# Patient Record
Sex: Female | Born: 1950 | Race: Black or African American | Hispanic: No | Smoking: Never smoker
Health system: Southern US, Community
[De-identification: ages and names within clinical notes are randomized; demographics above are authoritative.]

## PROBLEM LIST (undated history)

## (undated) DIAGNOSIS — E119 Type 2 diabetes mellitus without complications: Secondary | ICD-10-CM

## (undated) DIAGNOSIS — E785 Hyperlipidemia, unspecified: Secondary | ICD-10-CM

## (undated) DIAGNOSIS — I10 Essential (primary) hypertension: Secondary | ICD-10-CM

## (undated) DIAGNOSIS — B029 Zoster without complications: Secondary | ICD-10-CM

## (undated) DIAGNOSIS — K589 Irritable bowel syndrome without diarrhea: Secondary | ICD-10-CM

## (undated) DIAGNOSIS — M109 Gout, unspecified: Secondary | ICD-10-CM

## (undated) HISTORY — DX: Hyperlipidemia, unspecified: E78.5

## (undated) HISTORY — PX: CHOLECYSTECTOMY: SHX55

## (undated) HISTORY — PX: APPENDECTOMY: SHX54

## (undated) HISTORY — PX: CARPAL TUNNEL RELEASE: SHX101

## (undated) HISTORY — DX: Zoster without complications: B02.9

## (undated) HISTORY — DX: Gout, unspecified: M10.9

## (undated) HISTORY — DX: Essential (primary) hypertension: I10

## (undated) HISTORY — PX: ABDOMINAL HYSTERECTOMY: SHX81

## (undated) HISTORY — DX: Type 2 diabetes mellitus without complications: E11.9

## (undated) HISTORY — DX: Irritable bowel syndrome, unspecified: K58.9

## (undated) HISTORY — DX: Morbid (severe) obesity due to excess calories: E66.01

---

## 1988-03-21 ENCOUNTER — Encounter (INDEPENDENT_AMBULATORY_CARE_PROVIDER_SITE_OTHER): Payer: Self-pay | Admitting: *Deleted

## 1997-06-19 ENCOUNTER — Encounter: Payer: Self-pay | Admitting: Internal Medicine

## 1998-01-02 ENCOUNTER — Ambulatory Visit (HOSPITAL_COMMUNITY): Admission: RE | Admit: 1998-01-02 | Discharge: 1998-01-02 | Payer: Self-pay | Admitting: Obstetrics & Gynecology

## 1998-08-26 ENCOUNTER — Ambulatory Visit (HOSPITAL_COMMUNITY): Admission: RE | Admit: 1998-08-26 | Discharge: 1998-08-26 | Payer: Self-pay | Admitting: Family Medicine

## 1998-08-26 ENCOUNTER — Encounter: Payer: Self-pay | Admitting: Family Medicine

## 1998-12-23 ENCOUNTER — Ambulatory Visit (HOSPITAL_COMMUNITY): Admission: RE | Admit: 1998-12-23 | Discharge: 1998-12-23 | Payer: Self-pay | Admitting: Family Medicine

## 1998-12-23 ENCOUNTER — Encounter: Payer: Self-pay | Admitting: Family Medicine

## 1999-01-14 ENCOUNTER — Ambulatory Visit (HOSPITAL_COMMUNITY): Admission: RE | Admit: 1999-01-14 | Discharge: 1999-01-14 | Payer: Self-pay | Admitting: Obstetrics & Gynecology

## 1999-01-14 ENCOUNTER — Encounter: Payer: Self-pay | Admitting: Obstetrics & Gynecology

## 1999-11-04 ENCOUNTER — Ambulatory Visit (HOSPITAL_COMMUNITY): Admission: RE | Admit: 1999-11-04 | Discharge: 1999-11-04 | Payer: Self-pay | Admitting: Family Medicine

## 1999-11-04 ENCOUNTER — Encounter: Payer: Self-pay | Admitting: Family Medicine

## 1999-11-11 ENCOUNTER — Other Ambulatory Visit: Admission: RE | Admit: 1999-11-11 | Discharge: 1999-11-11 | Payer: Self-pay | Admitting: Obstetrics and Gynecology

## 1999-12-02 ENCOUNTER — Encounter: Payer: Self-pay | Admitting: Internal Medicine

## 2000-02-11 ENCOUNTER — Encounter: Payer: Self-pay | Admitting: Obstetrics and Gynecology

## 2000-02-11 ENCOUNTER — Ambulatory Visit (HOSPITAL_COMMUNITY): Admission: RE | Admit: 2000-02-11 | Discharge: 2000-02-11 | Payer: Self-pay | Admitting: Obstetrics and Gynecology

## 2001-03-26 ENCOUNTER — Other Ambulatory Visit: Admission: RE | Admit: 2001-03-26 | Discharge: 2001-03-26 | Payer: Self-pay | Admitting: Obstetrics and Gynecology

## 2001-10-11 ENCOUNTER — Ambulatory Visit (HOSPITAL_COMMUNITY): Admission: RE | Admit: 2001-10-11 | Discharge: 2001-10-11 | Payer: Self-pay | Admitting: Obstetrics and Gynecology

## 2001-10-11 ENCOUNTER — Encounter: Payer: Self-pay | Admitting: Obstetrics and Gynecology

## 2001-12-07 ENCOUNTER — Ambulatory Visit (HOSPITAL_COMMUNITY): Admission: RE | Admit: 2001-12-07 | Discharge: 2001-12-07 | Payer: Self-pay | Admitting: Family Medicine

## 2001-12-07 ENCOUNTER — Encounter: Payer: Self-pay | Admitting: Family Medicine

## 2002-10-03 ENCOUNTER — Ambulatory Visit (HOSPITAL_COMMUNITY): Admission: RE | Admit: 2002-10-03 | Discharge: 2002-10-03 | Payer: Self-pay | Admitting: Obstetrics and Gynecology

## 2002-10-03 ENCOUNTER — Encounter: Payer: Self-pay | Admitting: Obstetrics and Gynecology

## 2002-12-11 ENCOUNTER — Encounter: Payer: Self-pay | Admitting: Family Medicine

## 2002-12-11 ENCOUNTER — Ambulatory Visit (HOSPITAL_COMMUNITY): Admission: RE | Admit: 2002-12-11 | Discharge: 2002-12-11 | Payer: Self-pay | Admitting: Family Medicine

## 2003-11-20 ENCOUNTER — Ambulatory Visit (HOSPITAL_COMMUNITY): Admission: RE | Admit: 2003-11-20 | Discharge: 2003-11-20 | Payer: Self-pay | Admitting: Obstetrics and Gynecology

## 2004-12-16 ENCOUNTER — Ambulatory Visit (HOSPITAL_COMMUNITY): Admission: RE | Admit: 2004-12-16 | Discharge: 2004-12-16 | Payer: Self-pay | Admitting: Obstetrics and Gynecology

## 2005-02-06 ENCOUNTER — Ambulatory Visit (HOSPITAL_COMMUNITY): Admission: RE | Admit: 2005-02-06 | Discharge: 2005-02-06 | Payer: Self-pay | Admitting: Family Medicine

## 2005-12-17 ENCOUNTER — Ambulatory Visit (HOSPITAL_COMMUNITY): Admission: RE | Admit: 2005-12-17 | Discharge: 2005-12-17 | Payer: Self-pay | Admitting: Family Medicine

## 2007-01-10 ENCOUNTER — Ambulatory Visit (HOSPITAL_COMMUNITY): Admission: RE | Admit: 2007-01-10 | Discharge: 2007-01-10 | Payer: Self-pay | Admitting: Obstetrics and Gynecology

## 2008-01-19 ENCOUNTER — Ambulatory Visit (HOSPITAL_COMMUNITY): Admission: RE | Admit: 2008-01-19 | Discharge: 2008-01-19 | Payer: Self-pay | Admitting: Obstetrics and Gynecology

## 2008-02-08 ENCOUNTER — Emergency Department (HOSPITAL_COMMUNITY): Admission: EM | Admit: 2008-02-08 | Discharge: 2008-02-08 | Payer: Self-pay | Admitting: Emergency Medicine

## 2008-10-09 ENCOUNTER — Ambulatory Visit (HOSPITAL_COMMUNITY): Admission: RE | Admit: 2008-10-09 | Discharge: 2008-10-09 | Payer: Self-pay | Admitting: Obstetrics and Gynecology

## 2009-04-10 ENCOUNTER — Ambulatory Visit (HOSPITAL_COMMUNITY): Admission: RE | Admit: 2009-04-10 | Discharge: 2009-04-10 | Payer: Self-pay | Admitting: Obstetrics and Gynecology

## 2010-01-09 ENCOUNTER — Ambulatory Visit (HOSPITAL_COMMUNITY): Admission: RE | Admit: 2010-01-09 | Discharge: 2010-01-09 | Payer: Self-pay | Admitting: Family Medicine

## 2010-01-10 ENCOUNTER — Ambulatory Visit (HOSPITAL_COMMUNITY): Admission: RE | Admit: 2010-01-10 | Discharge: 2010-01-10 | Payer: Self-pay | Admitting: Family Medicine

## 2010-01-29 ENCOUNTER — Encounter: Payer: Self-pay | Admitting: Pulmonary Disease

## 2010-02-19 DIAGNOSIS — E7800 Pure hypercholesterolemia, unspecified: Secondary | ICD-10-CM | POA: Insufficient documentation

## 2010-02-19 DIAGNOSIS — K219 Gastro-esophageal reflux disease without esophagitis: Secondary | ICD-10-CM | POA: Insufficient documentation

## 2010-02-19 DIAGNOSIS — I10 Essential (primary) hypertension: Secondary | ICD-10-CM | POA: Insufficient documentation

## 2010-02-19 DIAGNOSIS — D509 Iron deficiency anemia, unspecified: Secondary | ICD-10-CM | POA: Insufficient documentation

## 2010-02-19 DIAGNOSIS — F411 Generalized anxiety disorder: Secondary | ICD-10-CM | POA: Insufficient documentation

## 2010-02-19 DIAGNOSIS — H409 Unspecified glaucoma: Secondary | ICD-10-CM | POA: Insufficient documentation

## 2010-02-19 DIAGNOSIS — J309 Allergic rhinitis, unspecified: Secondary | ICD-10-CM | POA: Insufficient documentation

## 2010-02-19 DIAGNOSIS — D518 Other vitamin B12 deficiency anemias: Secondary | ICD-10-CM | POA: Insufficient documentation

## 2010-02-19 DIAGNOSIS — D510 Vitamin B12 deficiency anemia due to intrinsic factor deficiency: Secondary | ICD-10-CM | POA: Insufficient documentation

## 2010-02-20 ENCOUNTER — Ambulatory Visit: Payer: Self-pay | Admitting: Pulmonary Disease

## 2010-02-20 DIAGNOSIS — R51 Headache: Secondary | ICD-10-CM | POA: Insufficient documentation

## 2010-02-20 DIAGNOSIS — R519 Headache, unspecified: Secondary | ICD-10-CM | POA: Insufficient documentation

## 2010-02-20 DIAGNOSIS — D869 Sarcoidosis, unspecified: Secondary | ICD-10-CM | POA: Insufficient documentation

## 2010-03-12 ENCOUNTER — Ambulatory Visit: Payer: Self-pay | Admitting: Pulmonary Disease

## 2010-05-08 ENCOUNTER — Ambulatory Visit (HOSPITAL_COMMUNITY): Admission: RE | Admit: 2010-05-08 | Discharge: 2010-05-08 | Payer: Self-pay | Admitting: Obstetrics and Gynecology

## 2010-11-11 NOTE — Assessment & Plan Note (Signed)
Summary: consult for sarcoidosis   Copy to:  Merri Brunette Primary Provider/Referring Provider:  Merri Brunette  CC:  Pulmonary Consult.  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  She was diagnosed in 1989 with mediastinoscopy, and presented at that time with LN but no parenchymal disease.  She also has had a h/o ocular involvement.  Over the years, she has been completely stable, with persistent LN but no ISLD.  She has not required treatment from a pulmonary standpoint since her original diagnosis, and has not had a recent cxr or pfts.  She does note some increase in doe, but believes it is related to her obesity and conditioning.  She denies any cough or chest congestion.    Preventive Screening-Counseling & Management  Alcohol-Tobacco     Smoking Status: never  Current Medications (verified): 1)  Benicar 40 Mg Tabs (Olmesartan Medoxomil) .... Take 1 Tablet By Mouth Once A Day 2)  Norvasc 5 Mg Tabs (Amlodipine Besylate) .... Take 1 Tablet By Mouth Once A Day 3)  Lasix 20 Mg Tabs (Furosemide) .... Take 1 Tablet By Mouth Once A Day 4)  Alprazolam 0.5 Mg Tabs (Alprazolam) .... As Needed 5)  Omeprazole 20 Mg Cpdr (Omeprazole) .... Take 1 Tablet By Mouth Once A Day 6)  Allegra 180 Mg Tabs (Fexofenadine Hcl) .... Take 1 Tablet By Mouth Once A Day 7)  Singulair 10 Mg Tabs (Montelukast Sodium) .... Take 1 Tablet By Mouth Once A Day As Needed 8)  Nasonex 50 Mcg/act  Susp (Mometasone Furoate) .... Two Puffs Each Nostril Daily As Needed 9)  Ventolin Hfa 108 (90 Base) Mcg/act  Aers (Albuterol Sulfate) .Marland Kitchen.. 1-2 Puffs Every 4-6 Hours As Needed 10)  Timolol Maleate 0.5 % Soln (Timolol Maleate) .Marland Kitchen.. 1 Drop in Both Eyes Two Times A Day 11)  Travatan Z 0.004 % Soln (Travoprost) .Marland Kitchen.. 1 Drop Into Both Eyes Each Evening 12)  Fish Oil 1000 Mg Caps (Omega-3 Fatty Acids) .... Take 3 Tabs By Mouth Daily 13)  Multivitamins  Tabs  (Multiple Vitamin) .... Take 1 Tablet By Mouth Once A Day 14)  Alphagan P 0.1 % Soln (Brimonidine Tartrate) .Marland Kitchen.. 1 Drop in Both Eyes Two Times A Day 15)  Pataday 0.2 % Soln (Olopatadine Hcl) .... Use As Needed 16)  Refresh Tears 0.5 % Soln (Carboxymethylcellulose Sodium) .... As Needed 17)  Aspirin Ec Low Dose 81 Mg Tbec (Aspirin) .... Take 1 Tablet By Mouth Once A Day 18)  Zetia 10 Mg Tabs (Ezetimibe) .... Take 1 Tablet By Mouth Once A Day 19)  Cleanse More .... Take 2 At Bedtime  Allergies (verified): 1)  ! Prednisone  Past History:  Past Medical History: sarcoidosis--dx 1989 by mediastinoscopy, +ocular involvement. HEADACHE, CHRONIC (ICD-784.0) GASTROESOPHAGEAL REFLUX DISEASE (ICD-530.81) ALLERGIC RHINITIS (ICD-477.9) ANXIETY (ICD-300.00) ANEMIA, IRON DEFICIENCY (ICD-280.9) ANEMIA, B12 DEFICIENCY (ICD-281.1) ANEMIA, PERNICIOUS (ICD-281.0) HYPERCHOLESTEROLEMIA (ICD-272.0) GLAUCOMA (ICD-365.9) OBESITY (ICD-278.00) HYPERTENSION (ICD-401.9)    Past Surgical History: appendectomy at age 67 cholecystectomy 1977 cataract surgery 2000s hysterectomy, ovaries remain 1980s stomach staple 1980s bilateral carpal tunnel repair  1980s glaucoma surgery in both eyes 1980s cornea surgery Feb 2011  Family History: Reviewed history from 02/19/2010 and no changes required. allergies: son, sister heart disease: mother, sister, brother cancer: father Designer, fashion/clothing)   Social History: Reviewed history and no changes required. Patient never smoked.  pt is divorced. pt has children. pt lives alone. pt works as an Pharmacologist  Status:  never  Review of Systems       The patient complains of shortness of breath with activity, chest pain, irregular heartbeats, abdominal pain, anxiety, and hand/feet swelling.  The patient denies shortness of breath at rest, productive cough, non-productive cough, coughing up blood, acid heartburn, indigestion, loss of appetite, weight  change, difficulty swallowing, sore throat, tooth/dental problems, headaches, nasal congestion/difficulty breathing through nose, sneezing, itching, ear ache, depression, joint stiffness or pain, rash, change in color of mucus, and fever.    Vital Signs:  Patient profile:   60 year old female Height:      64 inches Weight:      264 pounds BMI:     45.48 O2 Sat:      98 % on Room air Temp:     97.9 degrees F oral Pulse rate:   77 / minute BP sitting:   158 / 96  (right arm) Cuff size:   large  Vitals Entered By: Arman Filter LPN (Feb 20, 2010 3:19 PM)  O2 Flow:  Room air CC: Pulmonary Consult Comments Medications reviewed with patient Arman Filter LPN  Feb 20, 2010 3:19 PM    Physical Exam  General:  obese female in nad Eyes:  PERRLA and EOMI.   Nose:  patent without discharge Mouth:  clear Neck:  no jvd, tmg, LN Lungs:  totally clear to auscultation Heart:  rrr, no mrg Abdomen:  soft and nontender, bs+ Extremities:  1+ edema bilat, no cyanosis pulses intact distally Neurologic:  alert and oriented, moves all 4.   Impression & Recommendations:  Problem # 1:  PULMONARY SARCOIDOSIS (ICD-135) the pt has a history of sarcoidosis, manifested as mediastinal/hilar LN and ocular involvement.  She has never had interstitial disease, nor has she had pulmonary symptoms since her original diagnosis.  Currently, she is fairly asymptomatic, and I agree that her weight and conditioning are more likely causes of her mild doe than her sarcoid.  She has not had recent pfts or cxr, and I think these are worth doing to establish a more current baseline for her in the event of breathing issues down the road.  I will call the pt once these are done and discuss results.  Medications Added to Medication List This Visit: 1)  Alprazolam 0.5 Mg Tabs (Alprazolam) .... As needed 2)  Singulair 10 Mg Tabs (Montelukast sodium) .... Take 1 tablet by mouth once a day as needed 3)  Nasonex 50 Mcg/act  Susp (Mometasone furoate) .... Two puffs each nostril daily as needed 4)  Timolol Maleate 0.5 % Soln (Timolol maleate) .Marland Kitchen.. 1 drop in both eyes two times a day 5)  Travatan Z 0.004 % Soln (Travoprost) .Marland Kitchen.. 1 drop into both eyes each evening 6)  Fish Oil 1000 Mg Caps (Omega-3 fatty acids) .... Take 3 tabs by mouth daily 7)  Alphagan P 0.1 % Soln (Brimonidine tartrate) .Marland Kitchen.. 1 drop in both eyes two times a day 8)  Pataday 0.2 % Soln (Olopatadine hcl) .... Use as needed 9)  Refresh Tears 0.5 % Soln (Carboxymethylcellulose sodium) .... As needed 10)  Aspirin Ec Low Dose 81 Mg Tbec (Aspirin) .... Take 1 tablet by mouth once a day 11)  Zetia 10 Mg Tabs (Ezetimibe) .... Take 1 tablet by mouth once a day 12)  Cleanse More  .... Take 2 at bedtime  Other Orders: Consultation Level III (16109) Pulmonary Referral (Pulmonary) T-2 View CXR (71020TC)  Patient Instructions: 1)  will check cxr today  2)  will setup for breathing studies, and let you know the results. 3)  work on weight loss and some type of conditioning program.    Appended Document: consult for sarcoidosis please let pt know that her breathing tests show that her lungs are mildly restricted, and probably secondary to her weight.  she needs to make a followup visit with me in one year or sooner if having new symptoms.    Appended Document: consult for sarcoidosis [id number]  Appended Document: consult for sarcoidosis called and spoke with pt.  pt aware of PFT results and to f/u in 1 yr or sooner if she develops problems.  pt verbalized understanding and denied any questions.

## 2010-11-11 NOTE — Letter (Signed)
Summary: [facility]  [facility]   Imported By: Sherian Rein 02/21/2010 09:35:25  _____________________________________________________________________  External Attachment:    Type:   Image     Comment:   External Document

## 2010-11-11 NOTE — Miscellaneous (Signed)
Summary: Orders Update pft charges  Clinical Lists Changes  Orders: Added new Service order of Carbon Monoxide diffusing w/capacity (94720) - Signed Added new Service order of Lung Volumes (94240) - Signed Added new Service order of Spirometry (Pre & Post) (94060) - Signed 

## 2010-11-11 NOTE — Letter (Signed)
Summary: [facility]   Imported By: Sherian Rein 02/27/2010 10:52:35  _____________________________________________________________________  External Attachment:    Type:   Image     Comment:   External Document

## 2010-11-11 NOTE — Consult Note (Signed)
[facility]   Imported By: Sherian Rein 02/21/2010 09:40:30  _____________________________________________________________________  External Attachment:    Type:   Image     Comment:   External Document

## 2010-11-11 NOTE — Consult Note (Signed)
[facility]   Imported By: Sherian Rein 02/21/2010 09:38:10  _____________________________________________________________________  External Attachment:    Type:   Image     Comment:   External Document

## 2011-05-01 ENCOUNTER — Other Ambulatory Visit (HOSPITAL_COMMUNITY): Payer: Self-pay | Admitting: Obstetrics and Gynecology

## 2011-05-01 DIAGNOSIS — Z1231 Encounter for screening mammogram for malignant neoplasm of breast: Secondary | ICD-10-CM

## 2011-05-14 ENCOUNTER — Ambulatory Visit (HOSPITAL_COMMUNITY): Payer: 59

## 2011-05-14 ENCOUNTER — Ambulatory Visit (HOSPITAL_COMMUNITY)
Admission: RE | Admit: 2011-05-14 | Discharge: 2011-05-14 | Disposition: A | Discharge status: Home / Self Care | Payer: 59 | Source: Ambulatory Visit | Attending: Family Medicine | Admitting: Family Medicine

## 2011-05-14 DIAGNOSIS — M7989 Other specified soft tissue disorders: Secondary | ICD-10-CM | POA: Insufficient documentation

## 2011-05-14 DIAGNOSIS — M79609 Pain in unspecified limb: Secondary | ICD-10-CM | POA: Insufficient documentation

## 2011-07-07 LAB — DIFFERENTIAL
Basophils Absolute: 0
Basophils Relative: 1
Eosinophils Absolute: 0.4
Eosinophils Relative: 5
Lymphocytes Relative: 23
Lymphs Abs: 1.8
Monocytes Absolute: 0.7
Monocytes Relative: 10
Neutro Abs: 4.8
Neutrophils Relative %: 61

## 2011-07-07 LAB — CBC
HCT: 36.5
Hemoglobin: 12.4
MCHC: 34
MCV: 89
Platelets: 386
RBC: 4.1
RDW: 14.6
WBC: 7.9

## 2011-07-07 LAB — COMPREHENSIVE METABOLIC PANEL
ALT: 20
AST: 21
Albumin: 3.7
Alkaline Phosphatase: 62
BUN: 11
CO2: 31
Calcium: 9.2
Chloride: 101
Creatinine, Ser: 1.23 — ABNORMAL HIGH
GFR calc Af Amer: 54 — ABNORMAL LOW
GFR calc non Af Amer: 45 — ABNORMAL LOW
Glucose, Bld: 115 — ABNORMAL HIGH
Potassium: 3.5
Sodium: 140
Total Bilirubin: 0.6
Total Protein: 7.4

## 2011-07-07 LAB — URINALYSIS, ROUTINE W REFLEX MICROSCOPIC
Specific Gravity, Urine: 1.011
pH: 6

## 2011-07-16 ENCOUNTER — Ambulatory Visit (HOSPITAL_COMMUNITY)
Admission: RE | Admit: 2011-07-16 | Discharge: 2011-07-16 | Disposition: A | Discharge status: Home / Self Care | Payer: 59 | Source: Ambulatory Visit | Attending: Obstetrics and Gynecology | Admitting: Obstetrics and Gynecology

## 2011-07-16 DIAGNOSIS — Z1231 Encounter for screening mammogram for malignant neoplasm of breast: Secondary | ICD-10-CM | POA: Insufficient documentation

## 2012-01-11 ENCOUNTER — Encounter: Payer: Self-pay | Admitting: Gastroenterology

## 2012-07-28 ENCOUNTER — Other Ambulatory Visit (HOSPITAL_COMMUNITY): Payer: Self-pay | Admitting: Obstetrics and Gynecology

## 2012-07-28 DIAGNOSIS — Z1231 Encounter for screening mammogram for malignant neoplasm of breast: Secondary | ICD-10-CM

## 2012-08-16 ENCOUNTER — Ambulatory Visit (HOSPITAL_COMMUNITY)
Admission: RE | Admit: 2012-08-16 | Discharge: 2012-08-16 | Disposition: A | Discharge status: Home / Self Care | Payer: 59 | Source: Ambulatory Visit | Attending: Obstetrics and Gynecology | Admitting: Obstetrics and Gynecology

## 2012-08-16 DIAGNOSIS — Z1231 Encounter for screening mammogram for malignant neoplasm of breast: Secondary | ICD-10-CM | POA: Insufficient documentation

## 2013-06-22 ENCOUNTER — Other Ambulatory Visit (HOSPITAL_COMMUNITY): Payer: Self-pay | Admitting: Obstetrics and Gynecology

## 2013-06-22 DIAGNOSIS — Z1231 Encounter for screening mammogram for malignant neoplasm of breast: Secondary | ICD-10-CM

## 2013-08-24 ENCOUNTER — Ambulatory Visit (HOSPITAL_COMMUNITY)
Admission: RE | Admit: 2013-08-24 | Discharge: 2013-08-24 | Disposition: A | Discharge status: Home / Self Care | Payer: 59 | Source: Ambulatory Visit | Attending: Obstetrics and Gynecology | Admitting: Obstetrics and Gynecology

## 2013-08-24 DIAGNOSIS — Z1231 Encounter for screening mammogram for malignant neoplasm of breast: Secondary | ICD-10-CM | POA: Insufficient documentation

## 2013-08-27 ENCOUNTER — Other Ambulatory Visit: Payer: Self-pay | Admitting: Cardiology

## 2014-02-14 ENCOUNTER — Encounter: Payer: Self-pay | Admitting: Cardiology

## 2014-04-06 ENCOUNTER — Ambulatory Visit: Payer: 59 | Admitting: Cardiology

## 2014-05-24 ENCOUNTER — Encounter: Payer: Self-pay | Admitting: Cardiology

## 2014-05-24 ENCOUNTER — Ambulatory Visit (INDEPENDENT_AMBULATORY_CARE_PROVIDER_SITE_OTHER): Payer: 59 | Admitting: Cardiology

## 2014-05-24 VITALS — BP 132/98 | HR 65 | Ht 64.0 in | Wt 275.4 lb

## 2014-05-24 DIAGNOSIS — I10 Essential (primary) hypertension: Secondary | ICD-10-CM

## 2014-05-24 NOTE — Progress Notes (Signed)
[address] [place], [place]  [address] Phone: [phone] Fax:  [phone]  Date:  05/24/2014   ID:  Wendy Lagosatricia A Demasi, DOB May 13, 1951, MRN [id number]  PCP:  PROVIDER NOT IN SYSTEM   History of Present Illness: Wendy Downs is a 63 y.o. female with hx of difficult to control hypertension, obesity, and hyperlipidemia. Nonsmoker. BMI 41. HTN since [age]year old. Normal renal duplex. She continues to deny any symptoms such as chest pain, shortness of breath, syncope, nor palpitations. She has had edema on higher dose amlodipine and gout on HCTZ. After BP 196/104, Dr Anne FuSkains started Spironolactone 25 mg po qd. She states she is tolerating the medication without difficulty.Labs after med started were stable. She has been trying to eat healthier with more fresh fruits and vegetables.     Wt Readings from Last 3 Encounters:  05/24/14 275 lb 6.4 oz (124.921 kg)  02/20/10 264 lb (119.75 kg)     Past Medical History  Diagnosis Date  . Hypertension   . Hyperlipidemia   . Gout   . Shingles   . Morbid obesity     History reviewed. No pertinent past surgical history.  Current Outpatient Prescriptions  Medication Sig Dispense Refill  . amLODipine (NORVASC) 5 MG tablet Take 5 mg by mouth daily.      . B Complex Vitamins (VITAMIN B COMPLEX IJ) Inject as directed. Vitamin b12 injection      . brimonidine (ALPHAGAN P) 0.1 % SOLN       . furosemide (LASIX) 20 MG tablet Take 20 mg by mouth.      Marland Kitchen. LORazepam (ATIVAN) 0.5 MG tablet Take 0.5 mg by mouth every 8 (eight) hours.      . Multiple Vitamins-Minerals (MEGA MULTI WOMEN PO) Take by mouth.      . Nebivolol HCl (BYSTOLIC) 20 MG TABS Take by mouth.      . olmesartan (BENICAR) 40 MG tablet Take 40 mg by mouth daily.      . Omega-3 Fatty Acids (FISH OIL) 1000 MG CAPS Take by mouth.      . timolol (BETIMOL) 0.25 % ophthalmic solution 1-2 drops 2 (two) times daily.      . travoprost, benzalkonium, (TRAVATAN) 0.004 %  ophthalmic solution 1 drop at bedtime.       No current facility-administered medications for this visit.    Allergies:    Allergies  Allergen Reactions  . Prednisone   . Valtrex [Valacyclovir Hcl]   . Vaseretic [Enalapril-Hydrochlorothiazide]     Social History:  The patient  reports that she has never smoked. She does not have any smokeless tobacco history on file.   Family History  Problem Relation Age of Onset  . Heart attack Mother   . Hypertension Mother   . Stroke Father   . Heart disease Sister   . Stroke Sister   . Heart disease Brother   . Stroke Brother   . Stroke Sister   . Aneurysm Brother     ROS:  Please see the history of present illness.   Denies any syncope, bleeding, orthopnea, PND   All other systems reviewed and negative.   PHYSICAL EXAM: VS:  BP 132/98  Pulse 65  Ht 5\' 4"  (1.626 m)  Wt 275 lb 6.4 oz (124.921 kg)  BMI 47.25 kg/m2 Well nourished, well developed, in no acute distress HEENT: normal, [place]/AT, EOMI Neck: no JVD, normal carotid upstroke, no bruit Cardiac:  normal  S1, S2; RRR; no murmur Lungs:  clear to auscultation bilaterally, no wheezing, rhonchi or rales Abd: soft, nontender, no hepatomegaly, no bruitsobese Ext: no edema, 2+ distal pulses Skin: warm and dry GU: deferred Neuro: no focal abnormalities noted, AAO x 3  EKG:  05/24/14-sinus rhythm, 65, no other abnormalities, poor R-wave progression.     ASSESSMENT AND PLAN:  1. Hypertension-diastolic blood pressure is elevated today however systolic is much improved. In the past, we have tried spironolactone however she ended up with prerenal azotemia, BUN 62, creatinine 2. Her creatinine currently is in the 1 range. Avoid heavy diuresis. Her blood pressure will be helped tremendously also with weight loss. 2. Morbid obesity-she is back on Weight Watchers. Encouragement. She is very motivated. She is about to retire from [employer]. We discussed weight loss at length today. 3. We'll  see back in one year.  Signed, Donato Schultz, MD [facility]  05/24/2014 4:07 PM

## 2014-05-24 NOTE — Patient Instructions (Signed)
The current medical regimen is effective;  continue present plan and medications.  Follow up in 1 year with Dr Skains.  You will receive a letter in the mail 2 months before you are due.  Please call us when you receive this letter to schedule your follow up appointment.  

## 2014-06-13 ENCOUNTER — Other Ambulatory Visit: Payer: Self-pay | Admitting: Gastroenterology

## 2014-06-23 ENCOUNTER — Emergency Department (HOSPITAL_BASED_OUTPATIENT_CLINIC_OR_DEPARTMENT_OTHER)
Admission: EM | Admit: 2014-06-23 | Discharge: 2014-06-23 | Disposition: A | Discharge status: Home / Self Care | Payer: 59 | Attending: Emergency Medicine | Admitting: Emergency Medicine

## 2014-06-23 ENCOUNTER — Encounter (HOSPITAL_BASED_OUTPATIENT_CLINIC_OR_DEPARTMENT_OTHER): Payer: Self-pay | Admitting: Emergency Medicine

## 2014-06-23 ENCOUNTER — Emergency Department (HOSPITAL_BASED_OUTPATIENT_CLINIC_OR_DEPARTMENT_OTHER): Payer: 59

## 2014-06-23 DIAGNOSIS — M79609 Pain in unspecified limb: Secondary | ICD-10-CM | POA: Insufficient documentation

## 2014-06-23 DIAGNOSIS — IMO0002 Reserved for concepts with insufficient information to code with codable children: Secondary | ICD-10-CM | POA: Insufficient documentation

## 2014-06-23 DIAGNOSIS — M109 Gout, unspecified: Secondary | ICD-10-CM

## 2014-06-23 DIAGNOSIS — Z8619 Personal history of other infectious and parasitic diseases: Secondary | ICD-10-CM | POA: Insufficient documentation

## 2014-06-23 DIAGNOSIS — I10 Essential (primary) hypertension: Secondary | ICD-10-CM | POA: Insufficient documentation

## 2014-06-23 DIAGNOSIS — Z79899 Other long term (current) drug therapy: Secondary | ICD-10-CM | POA: Insufficient documentation

## 2014-06-23 DIAGNOSIS — M654 Radial styloid tenosynovitis [de Quervain]: Secondary | ICD-10-CM | POA: Insufficient documentation

## 2014-06-23 MED ORDER — PREDNISONE 10 MG PO TABS: 20.0000 mg | ORAL_TABLET | Freq: Every day | ORAL | Status: DC

## 2014-06-23 MED ORDER — COLCHICINE 0.6 MG PO TABS
0.6000 mg | ORAL_TABLET | Freq: Once | ORAL | Status: AC
  Administered 2014-06-23: 0.6 mg via ORAL
  Filled 2014-06-23: qty 1

## 2014-06-23 MED ORDER — COLCHICINE 0.6 MG PO TABS: 0.6000 mg | ORAL_TABLET | Freq: Two times a day (BID) | ORAL | Status: DC

## 2014-06-23 MED ORDER — HYDROCODONE-ACETAMINOPHEN 5-325 MG PO TABS: 2.0000 | ORAL_TABLET | ORAL | Status: DC | PRN

## 2014-06-23 NOTE — ED Notes (Signed)
Pt reports swelling to right thumb since Thursday. Unknown injury. Seen at [facility], but x-ray machine down, sent here. States painful to touch. No obvious deformity noted.

## 2014-06-23 NOTE — Discharge Instructions (Signed)
De Quervain's Disease Suzette Battiest disease is a condition often seen in racquet sports where there is a soreness (inflammation) in the cord like structures (tendons) which attach muscle to bone on the thumb side of the wrist. There may be a tightening of the tissuesaround the tendons. This condition is often helped by giving up or modifying the activity which caused it. When conservative treatment does not help, surgery may be required. Conservative treatment could include changes in the activity which brought about the problem or made it worse. Anti-inflammatory medications and injections may be used to help decrease the inflammation and help with pain control. Your caregiver will help you determine which is best for you. DIAGNOSIS  Often the diagnosis (learning what is wrong) can be made by examination. Sometimes x-rays are required. HOME CARE INSTRUCTIONS   Apply ice to the sore area for 15-20 minutes, 03-04 times per day while awake. Put the ice in a plastic bag and place a towel between the bag of ice and your skin. This is especially helpful if it can be done after all activities involving the sore wrist.  Temporary splinting may help.  Only take over-the-counter or prescription medicines for pain, discomfort or fever as directed by your caregiver. SEEK MEDICAL CARE IF:   Pain relief is not obtained with medications, or if you have increasing pain and seem to be getting worse rather than better. MAKE SURE YOU:   Understand these instructions.  Will watch your condition.  Will get help right away if you are not doing well or get worse. Document Released: 06/23/2001 Document Revised: 12/21/2011 Document Reviewed: 01/31/2014 [facility] Patient Information 2015 [place], [place]. This information is not intended to replace advice given to you by your health care provider. Make sure you discuss any questions you have with your health care provider.  Gout Gout is an inflammatory arthritis  caused by a buildup of uric acid crystals in the joints. Uric acid is a chemical that is normally present in the blood. When the level of uric acid in the blood is too high it can form crystals that deposit in your joints and tissues. This causes joint redness, soreness, and swelling (inflammation). Repeat attacks are common. Over time, uric acid crystals can form into masses (tophi) near a joint, destroying bone and causing disfigurement. Gout is treatable and often preventable. CAUSES  The disease begins with elevated levels of uric acid in the blood. Uric acid is produced by your body when it breaks down a naturally found substance called purines. Certain foods you eat, such as meats and fish, contain high amounts of purines. Causes of an elevated uric acid level include:  Being passed down from parent to child (heredity).  Diseases that cause increased uric acid production (such as obesity, psoriasis, and certain cancers).  Excessive alcohol use.  Diet, especially diets rich in meat and seafood.  Medicines, including certain cancer-fighting medicines (chemotherapy), water pills (diuretics), and aspirin.  Chronic kidney disease. The kidneys are no longer able to remove uric acid well.  Problems with metabolism. Conditions strongly associated with gout include:  Obesity.  High blood pressure.  High cholesterol.  Diabetes. Not everyone with elevated uric acid levels gets gout. It is not understood why some people get gout and others do not. Surgery, joint injury, and eating too much of certain foods are some of the factors that can lead to gout attacks. SYMPTOMS   An attack of gout comes on quickly. It causes intense pain with redness,  swelling, and warmth in a joint.  Fever can occur.  Often, only one joint is involved. Certain joints are more commonly involved:  Base of the big toe.  Knee.  Ankle.  Wrist.  Finger. Without treatment, an attack usually goes away in a few  days to weeks. Between attacks, you usually will not have symptoms, which is different from many other forms of arthritis. DIAGNOSIS  Your caregiver will suspect gout based on your symptoms and exam. In some cases, tests may be recommended. The tests may include:  Blood tests.  Urine tests.  X-rays.  Joint fluid exam. This exam requires a needle to remove fluid from the joint (arthrocentesis). Using a microscope, gout is confirmed when uric acid crystals are seen in the joint fluid. TREATMENT  There are two phases to gout treatment: treating the sudden onset (acute) attack and preventing attacks (prophylaxis).  Treatment of an Acute Attack.  Medicines are used. These include anti-inflammatory medicines or steroid medicines.  An injection of steroid medicine into the affected joint is sometimes necessary.  The painful joint is rested. Movement can worsen the arthritis.  You may use warm or cold treatments on painful joints, depending which works best for you.  Treatment to Prevent Attacks.  If you suffer from frequent gout attacks, your caregiver may advise preventive medicine. These medicines are started after the acute attack subsides. These medicines either help your kidneys eliminate uric acid from your body or decrease your uric acid production. You may need to stay on these medicines for a very long time.  The early phase of treatment with preventive medicine can be associated with an increase in acute gout attacks. For this reason, during the first few months of treatment, your caregiver may also advise you to take medicines usually used for acute gout treatment. Be sure you understand your caregiver's directions. Your caregiver may make several adjustments to your medicine dose before these medicines are effective.  Discuss dietary treatment with your caregiver or dietitian. Alcohol and drinks high in sugar and fructose and foods such as meat, poultry, and seafood can increase  uric acid levels. Your caregiver or dietitian can advise you on drinks and foods that should be limited. HOME CARE INSTRUCTIONS   Do not take aspirin to relieve pain. This raises uric acid levels.  Only take over-the-counter or prescription medicines for pain, discomfort, or fever as directed by your caregiver.  Rest the joint as much as possible. When in bed, keep sheets and blankets off painful areas.  Keep the affected joint raised (elevated).  Apply warm or cold treatments to painful joints. Use of warm or cold treatments depends on which works best for you.  Use crutches if the painful joint is in your leg.  Drink enough fluids to keep your urine clear or pale yellow. This helps your body get rid of uric acid. Limit alcohol, sugary drinks, and fructose drinks.  Follow your dietary instructions. Pay careful attention to the amount of protein you eat. Your daily diet should emphasize fruits, vegetables, whole grains, and fat-free or low-fat milk products. Discuss the use of coffee, vitamin C, and cherries with your caregiver or dietitian. These may be helpful in lowering uric acid levels.  Maintain a healthy body weight. SEEK MEDICAL CARE IF:   You develop diarrhea, vomiting, or any side effects from medicines.  You do not feel better in 24 hours, or you are getting worse. SEEK IMMEDIATE MEDICAL CARE IF:   Your joint becomes suddenly  more tender, and you have chills or a fever. MAKE SURE YOU:   Understand these instructions.  Will watch your condition.  Will get help right away if you are not doing well or get worse. Document Released: 09/25/2000 Document Revised: 02/12/2014 Document Reviewed: 05/11/2012 [facility] Patient Information 2015 [place], [place]. This information is not intended to replace advice given to you by your health care provider. Make sure you discuss any questions you have with your health care provider.

## 2014-06-23 NOTE — ED Provider Notes (Signed)
CSN: [id number]     Arrival date & time 06/23/14  1406 History  This chart was scribed for Rolland Porter, MD by Modena Jansky, ED Scribe. This patient was seen in room MHT13/MHT13 and the patient's care was started at 4:02 PM.   Chief Complaint  Patient presents with  . Hand Pain   The history is provided by the patient. No language interpreter was used.   HPI Comments: Wendy Downs is a 62 y.o. female with a hx of HTN who presents to the Emergency Department complaining of constant moderate right hand pain that started about 2 days ago. She states that she is unsure of any injury. She reports that she got a colonoscopy about 10 days ago and she had an IV placed in her left forearm. 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   Past Medical History  Diagnosis Date  . Hypertension   . Hyperlipidemia   . Gout   . Shingles   . Morbid obesity    Past Surgical History  Procedure Laterality Date  . Appendectomy    . Cholecystectomy    . Carpal tunnel release     Family History  Problem Relation Age of Onset  . Heart attack Mother   . Hypertension Mother   . Stroke Father   . Heart disease Sister   . Stroke Sister   . Heart disease Brother   . Stroke Brother   . Stroke Sister   . Aneurysm Brother    History  Substance Use Topics  . Smoking status: Never Smoker   . Smokeless tobacco: Not on file  . Alcohol Use: No   OB History   Grav Para Term Preterm Abortions TAB SAB Ect Mult Living                 Review of Systems  Constitutional: Negative for fever, chills, diaphoresis, appetite change and fatigue.  HENT: Negative for mouth sores, sore throat and trouble swallowing.   Eyes: Negative for visual disturbance.  Respiratory: Negative for cough, chest tightness, shortness of breath and wheezing.   Cardiovascular: Negative for chest pain.  Gastrointestinal: Negative for nausea, vomiting, abdominal pain, diarrhea and abdominal  distention.  Endocrine: Negative for polydipsia, polyphagia and polyuria.  Genitourinary: Negative for dysuria, frequency and hematuria.  Musculoskeletal: Positive for myalgias. Negative for gait problem.  Skin: Negative for color change, pallor and rash.  Neurological: Negative for dizziness, syncope, light-headedness and headaches.  Hematological: Does not bruise/bleed easily.  Psychiatric/Behavioral: Negative for behavioral problems and confusion.    Allergies  Prednisone; Valtrex; and Vaseretic  Home Medications   Prior to Admission medications   Medication Sig Start Date End Date Taking? Authorizing Provider  amLODipine (NORVASC) 5 MG tablet Take 5 mg by mouth daily.    Historical Provider, MD  B Complex Vitamins (VITAMIN B COMPLEX IJ) Inject as directed. Vitamin b12 injection    Historical Provider, MD  brimonidine (ALPHAGAN P) 0.1 % SOLN     Historical Provider, MD  colchicine 0.6 MG tablet Take 1 tablet (0.6 mg total) by mouth 2 (two) times daily. 06/23/14   Rolland Porter, MD  furosemide (LASIX) 20 MG tablet Take 20 mg by mouth.    Historical Provider, MD  HYDROcodone-acetaminophen (NORCO/VICODIN) 5-325 MG per tablet Take 2 tablets by mouth every 4 (four) hours as needed. 06/23/14   Rolland Porter, MD  LORazepam (ATIVAN) 0.5 MG tablet Take 0.5 mg  by mouth every 8 (eight) hours.    Historical Provider, MD  Multiple Vitamins-Minerals (MEGA MULTI WOMEN PO) Take by mouth.    Historical Provider, MD  Nebivolol HCl (BYSTOLIC) 20 MG TABS Take by mouth.    Historical Provider, MD  olmesartan (BENICAR) 40 MG tablet Take 40 mg by mouth daily.    Historical Provider, MD  Omega-3 Fatty Acids (FISH OIL) 1000 MG CAPS Take by mouth.    Historical Provider, MD  predniSONE (DELTASONE) 10 MG tablet Take 2 tablets (20 mg total) by mouth daily. 06/23/14   Rolland Porter, MD  timolol (BETIMOL) 0.25 % ophthalmic solution 1-2 drops 2 (two) times daily.    Historical Provider, MD  travoprost, benzalkonium,  (TRAVATAN) 0.004 % ophthalmic solution 1 drop at bedtime.    Historical Provider, MD   BP 161/96  Pulse 75  Temp(Src) 98.2 F (36.8 C) (Oral)  Resp 16  SpO2 100% Physical Exam  Nursing note and vitals reviewed. Constitutional: She is oriented to person, place, and time. She appears well-developed and well-nourished. No distress.  HENT:  Head: Normocephalic.  Eyes: Conjunctivae are normal. Pupils are equal, round, and reactive to light. No scleral icterus.  Neck: Normal range of motion. Neck supple. No thyromegaly present.  Cardiovascular: Normal rate and regular rhythm.  Exam reveals no gallop and no friction rub.   No murmur heard. Pulmonary/Chest: Effort normal and breath sounds normal. No respiratory distress. She has no wheezes. She has no rales.  Abdominal: Soft. Bowel sounds are normal. She exhibits no distension. There is no tenderness. There is no rebound.  Musculoskeletal: Normal range of motion. She exhibits tenderness.  TTP dorsum of right thumb from IP joint to wrist. Positive Finklestein test. Erythema and warmth over extensor of thumb. Rest of joints and hand are not painful.  Neurological: She is alert and oriented to person, place, and time.  Skin: Skin is warm and dry. No rash noted.  Psychiatric: She has a normal mood and affect. Her behavior is normal.    ED Course  Procedures (including critical care time) DIAGNOSTIC STUDIES: Oxygen Saturation is 100% on RA, normal by my interpretation.    COORDINATION OF CARE: 4:06 PM- Pt advised of plan for treatment which includes radiology and medication and pt agrees.  Labs Review Labs Reviewed - No data to display  Imaging Review Dg Hand Complete Right  06/23/2014   CLINICAL DATA:  RIGHT hand pain with swelling.  EXAM: RIGHT HAND - COMPLETE 3+ VIEW  COMPARISON:  None.  FINDINGS: Anatomic alignment. No fracture. 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 Mild soft tissue swelling  overlies the metacarpals on the lateral view. Small osteochondroma projects off the ulnar and dorsal aspect of the third metacarpal head. Mild soft tissue swelling is present over the dorsum of the MCP joints on the lateral view.  No gas in the soft tissues.  No radiopaque foreign body.  IMPRESSION: No acute osseous abnormality.  Mild dorsal soft tissue swelling.   Electronically Signed   By: Andreas Newport M.D.   On: 06/23/2014 15:04     EKG Interpretation None      MDM   Final diagnoses:  Acute gout of right hand, unspecified cause  Tenosynovitis, de Quervain    I personally performed the services described in this documentation, which was scribed in my presence. The recorded information has been reviewed and is accurate.     Rolland Porter, MD 06/23/14 [phone]

## 2014-08-10 ENCOUNTER — Other Ambulatory Visit (HOSPITAL_COMMUNITY): Payer: Self-pay | Admitting: Obstetrics and Gynecology

## 2014-08-10 DIAGNOSIS — Z1231 Encounter for screening mammogram for malignant neoplasm of breast: Secondary | ICD-10-CM

## 2014-08-23 ENCOUNTER — Emergency Department (HOSPITAL_COMMUNITY): Payer: 59

## 2014-08-23 ENCOUNTER — Emergency Department (HOSPITAL_COMMUNITY)
Admission: EM | Admit: 2014-08-23 | Discharge: 2014-08-23 | Disposition: A | Discharge status: Home / Self Care | Payer: 59 | Attending: Emergency Medicine | Admitting: Emergency Medicine

## 2014-08-23 ENCOUNTER — Encounter (HOSPITAL_COMMUNITY): Payer: Self-pay

## 2014-08-23 DIAGNOSIS — M109 Gout, unspecified: Secondary | ICD-10-CM | POA: Insufficient documentation

## 2014-08-23 DIAGNOSIS — G43109 Migraine with aura, not intractable, without status migrainosus: Secondary | ICD-10-CM | POA: Diagnosis not present

## 2014-08-23 DIAGNOSIS — Z79899 Other long term (current) drug therapy: Secondary | ICD-10-CM | POA: Insufficient documentation

## 2014-08-23 DIAGNOSIS — Z8619 Personal history of other infectious and parasitic diseases: Secondary | ICD-10-CM | POA: Diagnosis not present

## 2014-08-23 DIAGNOSIS — G43909 Migraine, unspecified, not intractable, without status migrainosus: Secondary | ICD-10-CM | POA: Diagnosis present

## 2014-08-23 DIAGNOSIS — I10 Essential (primary) hypertension: Secondary | ICD-10-CM | POA: Insufficient documentation

## 2014-08-23 DIAGNOSIS — E785 Hyperlipidemia, unspecified: Secondary | ICD-10-CM | POA: Insufficient documentation

## 2014-08-23 DIAGNOSIS — R519 Headache, unspecified: Secondary | ICD-10-CM

## 2014-08-23 DIAGNOSIS — R51 Headache: Secondary | ICD-10-CM

## 2014-08-23 LAB — CBC
HCT: 37.6 % (ref 36.0–46.0)
HEMOGLOBIN: 12.6 g/dL (ref 12.0–15.0)
MCH: 29.7 pg (ref 26.0–34.0)
MCHC: 33.5 g/dL (ref 30.0–36.0)
MCV: 88.7 fL (ref 78.0–100.0)
Platelets: 309 10*3/uL (ref 150–400)
RBC: 4.24 MIL/uL (ref 3.87–5.11)
RDW: 13.9 % (ref 11.5–15.5)
WBC: 7 10*3/uL (ref 4.0–10.5)

## 2014-08-23 LAB — I-STAT CHEM 8, ED
BUN: 19 mg/dL (ref 6–23)
CHLORIDE: 104 meq/L (ref 96–112)
Calcium, Ion: 1.01 mmol/L — ABNORMAL LOW (ref 1.13–1.30)
Creatinine, Ser: 1.3 mg/dL — ABNORMAL HIGH (ref 0.50–1.10)
GLUCOSE: 130 mg/dL — AB (ref 70–99)
HCT: 40 % (ref 36.0–46.0)
HEMOGLOBIN: 13.6 g/dL (ref 12.0–15.0)
POTASSIUM: 4.1 meq/L (ref 3.7–5.3)
SODIUM: 141 meq/L (ref 137–147)
TCO2: 25 mmol/L (ref 0–100)

## 2014-08-23 LAB — DIFFERENTIAL
BASOS ABS: 0 10*3/uL (ref 0.0–0.1)
BASOS PCT: 0 % (ref 0–1)
Eosinophils Absolute: 0.3 10*3/uL (ref 0.0–0.7)
Eosinophils Relative: 4 % (ref 0–5)
LYMPHS PCT: 37 % (ref 12–46)
Lymphs Abs: 2.6 10*3/uL (ref 0.7–4.0)
Monocytes Absolute: 0.6 10*3/uL (ref 0.1–1.0)
Monocytes Relative: 9 % (ref 3–12)
NEUTROS PCT: 50 % (ref 43–77)
Neutro Abs: 3.5 10*3/uL (ref 1.7–7.7)

## 2014-08-23 LAB — I-STAT TROPONIN, ED: Troponin i, poc: 0.02 ng/mL (ref 0.00–0.08)

## 2014-08-23 LAB — COMPREHENSIVE METABOLIC PANEL
ALBUMIN: 3.6 g/dL (ref 3.5–5.2)
ALK PHOS: 59 U/L (ref 39–117)
ALT: 19 U/L (ref 0–35)
AST: 20 U/L (ref 0–37)
Anion gap: 16 — ABNORMAL HIGH (ref 5–15)
BILIRUBIN TOTAL: 0.4 mg/dL (ref 0.3–1.2)
BUN: 17 mg/dL (ref 6–23)
CHLORIDE: 100 meq/L (ref 96–112)
CO2: 25 meq/L (ref 19–32)
Calcium: 9.6 mg/dL (ref 8.4–10.5)
Creatinine, Ser: 1.18 mg/dL — ABNORMAL HIGH (ref 0.50–1.10)
GFR calc Af Amer: 55 mL/min — ABNORMAL LOW (ref >90)
GFR calc non Af Amer: 47 mL/min — ABNORMAL LOW (ref >90)
Glucose, Bld: 128 mg/dL — ABNORMAL HIGH (ref 70–99)
Potassium: 4.2 mEq/L (ref 3.7–5.3)
Sodium: 141 mEq/L (ref 137–147)
Total Protein: 7.4 g/dL (ref 6.0–8.3)

## 2014-08-23 LAB — PROTIME-INR
INR: 0.95 (ref 0.00–1.49)
Prothrombin Time: 12.8 seconds (ref 11.6–15.2)

## 2014-08-23 LAB — APTT: aPTT: 29 seconds (ref 24–37)

## 2014-08-23 MED ORDER — PROMETHAZINE HCL 25 MG/ML IJ SOLN
12.5000 mg | Freq: Once | INTRAMUSCULAR | Status: AC
  Administered 2014-08-23: 12.5 mg via INTRAVENOUS
  Filled 2014-08-23: qty 1

## 2014-08-23 MED ORDER — KETOROLAC TROMETHAMINE 30 MG/ML IJ SOLN
30.0000 mg | Freq: Once | INTRAMUSCULAR | Status: AC
  Administered 2014-08-23: 30 mg via INTRAVENOUS
  Filled 2014-08-23: qty 1

## 2014-08-23 MED ORDER — SODIUM CHLORIDE 0.9 % IV BOLUS (SEPSIS)
500.0000 mL | Freq: Once | INTRAVENOUS | Status: AC
  Administered 2014-08-23: 500 mL via INTRAVENOUS

## 2014-08-23 NOTE — ED Notes (Signed)
Per EMS: Pt at work when she had sudden onset of left sided HA at 1350 with numbness to left hand. Pt states initially when HA started she experienced dysphagia and dizziness lasting 2-3. Hx of migraines. States she had similar episode last June but states she didn't see a provider and it symptoms resolved on its own. 173/92. 76 bpm. 16 RR. 1005 RA. CBG 156. Pt is AO x4.

## 2014-08-23 NOTE — ED Notes (Signed)
CALLED MRI 45 min ETA

## 2014-08-23 NOTE — ED Provider Notes (Signed)
CSN: [id number]     Arrival date & time 08/23/14  1442 History   First MD Initiated Contact with Patient 08/23/14 1455     Chief Complaint  Patient presents with  . Migraine     (Consider location/radiation/quality/duration/timing/severity/associated sxs/prior Treatment) Patient is a 63 y.o. female presenting with migraines. The history is provided by the patient.  Migraine Associated symptoms include headaches. Pertinent negatives include no chest pain, no abdominal pain and no shortness of breath.  patient developed at headache at her lunchbreak today. It was like previous migraines that she had had with left sided throbbing and some visual "swirling." she had some difficulty speaking also. She has not had the difficulty speaking with the headaches in the past, but has had some left sided tingling 6 months ago. The speech difficulty has resolved, but the headache remains. Some nausea. No trauma. No relief with tylenol. History of Migraines.   Past Medical History  Diagnosis Date  . Hypertension   . Hyperlipidemia   . Gout   . Shingles   . Morbid obesity    Past Surgical History  Procedure Laterality Date  . Appendectomy    . Cholecystectomy    . Carpal tunnel release     Family History  Problem Relation Age of Onset  . Heart attack Mother   . Hypertension Mother   . Stroke Father   . Heart disease Sister   . Stroke Sister   . Heart disease Brother   . Stroke Brother   . Stroke Sister   . Aneurysm Brother    History  Substance Use Topics  . Smoking status: Never Smoker   . Smokeless tobacco: Not on file  . Alcohol Use: No   OB History    No data available     Review of Systems  Constitutional: Negative for activity change and appetite change.  Eyes: Positive for visual disturbance. Negative for pain.  Respiratory: Negative for chest tightness and shortness of breath.   Cardiovascular: Negative for chest pain and leg swelling.  Gastrointestinal: Negative for  nausea, vomiting, abdominal pain and diarrhea.  Genitourinary: Negative for flank pain.  Musculoskeletal: Negative for back pain and neck stiffness.  Skin: Negative for rash.  Neurological: Positive for speech difficulty and headaches. Negative for weakness and numbness.  Psychiatric/Behavioral: Negative for behavioral problems.      Allergies  Prednisone; Valtrex; and Vaseretic  Home Medications   Prior to Admission medications   Medication Sig Start Date End Date Taking? Authorizing Provider  amLODipine (NORVASC) 5 MG tablet Take 5 mg by mouth daily.   Yes Historical Provider, MD  B Complex Vitamins (VITAMIN B COMPLEX IJ) Inject as directed. Vitamin b12 injection   Yes Historical Provider, MD  brimonidine (ALPHAGAN P) 0.1 % SOLN Place 1 drop into both eyes 2 (two) times daily.    Yes Historical Provider, MD  colchicine 0.6 MG tablet Take 1 tablet (0.6 mg total) by mouth 2 (two) times daily. 06/23/14  Yes Rolland Porter, MD  furosemide (LASIX) 20 MG tablet Take 20 mg by mouth daily.    Yes Historical Provider, MD  LORazepam (ATIVAN) 0.5 MG tablet Take 0.5 mg by mouth every 8 (eight) hours.   Yes Historical Provider, MD  Multiple Vitamins-Minerals (AIRBORNE PO) Take 1 tablet by mouth daily.   Yes Historical Provider, MD  Multiple Vitamins-Minerals (MEGA MULTI WOMEN PO) Take by mouth.   Yes Historical Provider, MD  Nebivolol HCl (BYSTOLIC) 20 MG TABS Take by mouth.  Yes Historical Provider, MD  olmesartan (BENICAR) 40 MG tablet Take 40 mg by mouth daily.   Yes Historical Provider, MD  Omega-3 Fatty Acids (FISH OIL) 1000 MG CAPS Take by mouth.   Yes Historical Provider, MD  timolol (BETIMOL) 0.25 % ophthalmic solution Place 1-2 drops into both eyes 2 (two) times daily.    Yes Historical Provider, MD  travoprost, benzalkonium, (TRAVATAN) 0.004 % ophthalmic solution Place 1 drop into both eyes at bedtime.    Yes Historical Provider, MD   BP 111/55 mmHg  Pulse 64  Temp(Src) 97.6 F (36.4 C)  (Oral)  Resp 16  SpO2 95% Physical Exam  Constitutional: She is oriented to person, place, and time. She appears well-developed and well-nourished.  HENT:  Head: Normocephalic and atraumatic.  Eyes:  Chronically cloudy cornea on left. Some scleral injection  Cardiovascular: Normal rate, regular rhythm and normal heart sounds.   No murmur heard. Pulmonary/Chest: Effort normal and breath sounds normal. No respiratory distress. She has no wheezes. She has no rales.  Abdominal: Soft. Bowel sounds are normal. She exhibits no distension. There is no tenderness.  Musculoskeletal: Normal range of motion.  Neurological: She is alert and oriented to person, place, and time. No cranial nerve deficit.  Sensation intact grossly over bilateral hands.   Skin: Skin is warm and dry.  Psychiatric: She has a normal mood and affect. Her speech is normal.  Nursing note and vitals reviewed.   ED Course  Procedures (including critical care time) Labs Review Labs Reviewed  COMPREHENSIVE METABOLIC PANEL - Abnormal; Notable for the following:    Glucose, Bld 128 (*)    Creatinine, Ser 1.18 (*)    GFR calc non Af Amer 47 (*)    GFR calc Af Amer 55 (*)    Anion gap 16 (*)    All other components within normal limits  I-STAT CHEM 8, ED - Abnormal; Notable for the following:    Creatinine, Ser 1.30 (*)    Glucose, Bld 130 (*)    Calcium, Ion 1.01 (*)    All other components within normal limits  PROTIME-INR  APTT  CBC  DIFFERENTIAL  Rosezena SensorI-STAT TROPOININ, ED    Imaging Review Mr Brain Wo Contrast  08/23/2014   CLINICAL DATA:  Speech difficulties. Evaluate for CVA. LEFT arm tingling. Symptoms began 5 hr earlier today. History of migraine. Initial encounter.  EXAM: MRI HEAD WITHOUT CONTRAST  TECHNIQUE: Multiplanar, multiecho pulse sequences of the brain and surrounding structures were obtained without intravenous contrast.  COMPARISON:  MR brain 02/21/2008.  FINDINGS: No evidence for acute infarction,  hemorrhage, mass lesion, hydrocephalus, or extra-axial fluid. Normal cerebral volume. Minor subcortical and periventricular white matter signal abnormality, also affecting the brainstem, suggesting chronic microvascular ischemic change. Complicated migraine is less favored. Pituitary, pineal, and cerebellar tonsils unremarkable. Mild cervical spondylosis. Flow voids are maintained throughout the carotid, basilar, and vertebral arteries. There are no areas of chronic hemorrhage.  Visualized calvarium, skull base, and upper cervical osseous structures unremarkable. Scalp and extracranial soft tissues, orbits, sinuses, and mastoids show no acute process. RIGHT inferior turbinate hypertrophy.  IMPRESSION: No evidence for acute stroke or other acute intracranial process. Similar appearance to prior MR 2009.  Probable mild chronic microvascular ischemic change of the deep white matter. Complicated migraine less favored.   Electronically Signed   By: Davonna BellingJohn  Curnes M.D.   On: 08/23/2014 19:55     EKG Interpretation   Date/Time:  Thursday August 23 2014 14:48:09 EST Ventricular  Rate:  69 PR Interval:  174 QRS Duration: 88 QT Interval:  410 QTC Calculation: 439 R Axis:   42 Text Interpretation:  Sinus rhythm Confirmed by Rubin PayorPICKERING  MD, Harrold DonathNATHAN  [phone]([id number]) on 08/23/2014 3:25:01 PM      MDM   Final diagnoses:  Headache  Complicated migraine    Patient with headache and some difficulty speaking. Likely competent migraine. MRI overall reassuring. Some microvascular disease versus complicated migraine on it. Symptoms have resolved and patient feels better. Will discharge home to follow-up as needed    Juliet Rudeathan R. Rubin PayorPickering, MD 08/24/14 0020

## 2014-08-23 NOTE — Discharge Instructions (Signed)

## 2014-08-30 ENCOUNTER — Ambulatory Visit (HOSPITAL_COMMUNITY): Payer: 59

## 2014-09-03 ENCOUNTER — Ambulatory Visit (HOSPITAL_COMMUNITY)
Admission: RE | Admit: 2014-09-03 | Discharge: 2014-09-03 | Disposition: A | Discharge status: Home / Self Care | Payer: 59 | Source: Ambulatory Visit | Attending: Obstetrics and Gynecology | Admitting: Obstetrics and Gynecology

## 2014-09-03 DIAGNOSIS — Z1231 Encounter for screening mammogram for malignant neoplasm of breast: Secondary | ICD-10-CM | POA: Diagnosis present

## 2014-09-03 DIAGNOSIS — R928 Other abnormal and inconclusive findings on diagnostic imaging of breast: Secondary | ICD-10-CM | POA: Diagnosis not present

## 2014-09-05 ENCOUNTER — Other Ambulatory Visit: Payer: Self-pay | Admitting: Obstetrics and Gynecology

## 2014-09-05 DIAGNOSIS — R928 Other abnormal and inconclusive findings on diagnostic imaging of breast: Secondary | ICD-10-CM

## 2014-10-01 ENCOUNTER — Ambulatory Visit
Admission: RE | Admit: 2014-10-01 | Discharge: 2014-10-01 | Disposition: A | Discharge status: Home / Self Care | Payer: 59 | Source: Ambulatory Visit | Attending: Obstetrics and Gynecology | Admitting: Obstetrics and Gynecology

## 2014-10-01 DIAGNOSIS — R928 Other abnormal and inconclusive findings on diagnostic imaging of breast: Secondary | ICD-10-CM

## 2015-03-07 ENCOUNTER — Other Ambulatory Visit: Payer: Self-pay | Admitting: Obstetrics and Gynecology

## 2015-03-07 DIAGNOSIS — N6001 Solitary cyst of right breast: Secondary | ICD-10-CM

## 2015-03-18 ENCOUNTER — Ambulatory Visit (INDEPENDENT_AMBULATORY_CARE_PROVIDER_SITE_OTHER): Payer: 59 | Admitting: Internal Medicine

## 2015-03-18 ENCOUNTER — Telehealth: Payer: Self-pay | Admitting: Pulmonary Disease

## 2015-03-18 ENCOUNTER — Ambulatory Visit (INDEPENDENT_AMBULATORY_CARE_PROVIDER_SITE_OTHER)
Admission: RE | Admit: 2015-03-18 | Discharge: 2015-03-18 | Disposition: A | Discharge status: Home / Self Care | Payer: 59 | Source: Ambulatory Visit | Attending: Internal Medicine | Admitting: Internal Medicine

## 2015-03-18 ENCOUNTER — Encounter: Payer: Self-pay | Admitting: Internal Medicine

## 2015-03-18 ENCOUNTER — Other Ambulatory Visit (INDEPENDENT_AMBULATORY_CARE_PROVIDER_SITE_OTHER): Payer: 59

## 2015-03-18 VITALS — BP 126/70 | HR 78 | Ht 64.0 in | Wt 272.0 lb

## 2015-03-18 DIAGNOSIS — R0609 Other forms of dyspnea: Secondary | ICD-10-CM | POA: Insufficient documentation

## 2015-03-18 DIAGNOSIS — R0600 Dyspnea, unspecified: Secondary | ICD-10-CM

## 2015-03-18 DIAGNOSIS — D869 Sarcoidosis, unspecified: Secondary | ICD-10-CM

## 2015-03-18 LAB — CBC WITH DIFFERENTIAL/PLATELET
BASOS ABS: 0 10*3/uL (ref 0.0–0.1)
Basophils Relative: 0.2 % (ref 0.0–3.0)
Eosinophils Absolute: 0.3 10*3/uL (ref 0.0–0.7)
Eosinophils Relative: 4.1 % (ref 0.0–5.0)
HCT: 40.5 % (ref 36.0–46.0)
HEMOGLOBIN: 13.4 g/dL (ref 12.0–15.0)
LYMPHS ABS: 2.5 10*3/uL (ref 0.7–4.0)
LYMPHS PCT: 31.4 % (ref 12.0–46.0)
MCHC: 33.2 g/dL (ref 30.0–36.0)
MCV: 88.5 fl (ref 78.0–100.0)
Monocytes Absolute: 0.9 10*3/uL (ref 0.1–1.0)
Monocytes Relative: 11.5 % (ref 3.0–12.0)
NEUTROS ABS: 4.1 10*3/uL (ref 1.4–7.7)
Neutrophils Relative %: 52.8 % (ref 43.0–77.0)
Platelets: 360 10*3/uL (ref 150.0–400.0)
RBC: 4.58 Mil/uL (ref 3.87–5.11)
RDW: 14.1 % (ref 11.5–15.5)
WBC: 7.8 10*3/uL (ref 4.0–10.5)

## 2015-03-18 LAB — BASIC METABOLIC PANEL
BUN: 20 mg/dL (ref 6–23)
CO2: 33 mEq/L — ABNORMAL HIGH (ref 19–32)
Calcium: 9.7 mg/dL (ref 8.4–10.5)
Chloride: 99 mEq/L (ref 96–112)
Creatinine, Ser: 1.27 mg/dL — ABNORMAL HIGH (ref 0.40–1.20)
GFR: 54.15 mL/min — AB (ref >60.00)
Glucose, Bld: 118 mg/dL — ABNORMAL HIGH (ref 70–99)
Potassium: 4.3 mEq/L (ref 3.5–5.1)
Sodium: 136 mEq/L (ref 135–145)

## 2015-03-18 LAB — BRAIN NATRIURETIC PEPTIDE: Pro B Natriuretic peptide (BNP): 29 pg/mL (ref 0.0–100.0)

## 2015-03-18 LAB — TSH: TSH: 3.39 u[IU]/mL (ref 0.35–4.50)

## 2015-03-18 MED ORDER — FAMOTIDINE 20 MG PO TABS: ORAL_TABLET | ORAL | Status: DC

## 2015-03-18 MED ORDER — PANTOPRAZOLE SODIUM 40 MG PO TBEC: 40.0000 mg | DELAYED_RELEASE_TABLET | Freq: Every day | ORAL | Status: DC

## 2015-03-18 NOTE — Telephone Encounter (Signed)
Pt c/o increased cough and pressure in back for the past several days.  Pt states that pressure is in her upper back and is present when she coughs.  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  Pt is concerned that she is having a flare up of her Sarcoid.  Requested an OV today.  Pt scheduled for Consult visit with Dr Sherene SiresWert today 03/18/15 at 3:45 Pt aware to arrive around 3:15-3:30 to fill out paperwork.  Nothing further needed.

## 2015-03-18 NOTE — Progress Notes (Signed)
Subjective:    Patient ID: Wendy Downs, female    DOB: 04/22/1951, 64 y.o.   MRN: [id number]  HPI    28 yobf never smoker remotely followed by Dr Lenna Gilford / clance for sarcoid and referred back to pulmonary clinic 03/18/2015 by Dr Carol Ada for eval of sob ? Sarcoid active    03/18/2015 1st [facility] office visit/ Wendy Downs   Chief Complaint  Patient presents with  . 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    last seen 5 years prior to OV   and not on amny  prednisone since even before then, main manifestation was ocular > regular f/u for glaucoma/ [facility]   New problem indolent onset progressive/ persistent daily doe x May 2016 indolent onset with exertion initially now sob x across room and also sitting still, hear's wheezing at hs and has had episodes of cough to point of choking p smelling grilled food 6/5 assoc with sense of nasal and chest congestion but cough dry/ no purulent secretions/ worse day than noct    No obvious day to day or daytime variabilty or assoc   cp or   Overt  hb symptoms. No unusual exp hx or h/o childhood pna/ asthma or knowledge of premature birth.  Sleeping ok without nocturnal  or early am exacerbation  of respiratory  c/o's or need for noct saba. Also denies any obvious fluctuation of symptoms with weather or environmental changes or other aggravating or alleviating factors except as outlined above   Current Medications, Allergies, Complete Past Medical History, Past Surgical History, Family History, and Social History were reviewed in [employer] record.        Review of Systems  Constitutional: Positive for fatigue. Negative for fever and unexpected weight change.  HENT: Positive for congestion, ear pain and sinus pressure. Negative for dental problem, nosebleeds, postnasal drip, rhinorrhea, sneezing, sore throat and trouble swallowing.   Eyes: Negative for redness and itching.  Respiratory:  Positive for cough, chest tightness and shortness of breath. Negative for wheezing.   Cardiovascular: Positive for leg swelling. Negative for palpitations.  Gastrointestinal: Negative for nausea and vomiting.  Genitourinary: Negative for dysuria.  Musculoskeletal: Negative for joint swelling.  Skin: Negative for rash.  Neurological: Negative for headaches.  Hematological: Does not bruise/bleed easily.  Psychiatric/Behavioral: Negative for dysphoric mood. The patient is not nervous/anxious.        Objective:   Physical Exam  amb obese bf nad   Wt Readings from Last 3 Encounters:  03/18/15 272 lb (123.378 kg)  05/24/14 275 lb 6.4 oz (124.921 kg)  02/20/10 264 lb (119.75 kg)    Vital signs reviewed    HEENT: nl dentition, turbinates, and orophanx. Nl external ear canals without cough reflex   NECK :  without JVD/Nodes/TM/ nl carotid upstrokes bilaterally   LUNGS: no acc muscle use, clear to A and P bilaterally without cough on insp or exp maneuvers   CV:  RRR  no s3 or murmur or increase in P2, no edema   ABD:  soft and nontender with nl excursion in the supine position. No bruits or organomegaly, bowel sounds nl  MS:  warm without deformities, calf tenderness, cyanosis or clubbing  SKIN: warm and dry without lesions    NEURO:  alert, approp, no deficits   CXR PA and Lateral:   03/18/2015 :     I personally reviewed images and agree with radiology impression  as follows:      improved vs last cxr 2011 with better aeration bilaterally but persistent bilateral hilar adenopathy  Labs reviewed ESR  41/  ACE 41   Labs ordered/ reviewed:    Lab 03/18/15 1701  NA 136  K 4.3  CL 99  CO2 33*  BUN 20  CREATININE 1.27*  GLUCOSE 118*      Lab 03/18/15 1701  HGB 13.4  HCT 40.5  WBC 7.8  PLT 360.0     Lab Results  Component Value Date   TSH 3.39 03/18/2015     Lab Results  Component Value Date   PROBNP 29.0 03/18/2015             Assessment &  Plan:

## 2015-03-18 NOTE — Patient Instructions (Addendum)
Ok to try tylenol cold and sinus and call us if not better   Please remember to go to the  Lab and x-ray department downstairs for your tests - we will call you with the results when they are available.  Try prilosec 20mg   Take 30-60 min before first meal of the day and Pepcid (famotidine) 20 mg one bedtime until cough is completely gone for at least a week without the need for cough suppression  GERD (REFLUX)  is an extremely common cause of respiratory symptoms just like yours , many times with no obvious heartburn at all.    It can be treated with medication, but also with lifestyle changes including avoidance of late meals, elevation of the head of your bed (ideally with 6 inch  bed blocks) excessive alcohol, smoking cessation, and avoid fatty foods, chocolate, peppermint, colas, red wine, and acidic juices such as orange juice.  NO MINT OR MENTHOL PRODUCTS SO NO COUGH DROPS  USE SUGARLESS CANDY INSTEAD (Jolley ranchers or Stover's or Life Savers) or even ice chips will also do - the key is to swallow to prevent all throat clearing. NO OIL BASED VITAMINS - use powdered substitutes.    Please schedule a follow up office visit in 6 weeks, call sooner if needed pfts

## 2015-03-19 ENCOUNTER — Encounter: Payer: Self-pay | Admitting: Internal Medicine

## 2015-03-19 ENCOUNTER — Institutional Professional Consult (permissible substitution): Payer: Self-pay | Admitting: Internal Medicine

## 2015-03-19 LAB — D-DIMER, QUANTITATIVE (NOT AT ARMC): D DIMER QUANT: 0.68 ug{FEU}/mL — AB (ref 0.00–0.48)

## 2015-03-19 NOTE — Assessment & Plan Note (Signed)
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

## 2015-03-19 NOTE — Assessment & Plan Note (Addendum)
-   03/18/2015   Walked RA x 3 lap @ 16285ft each/ brisk pace / tol well s desats      Symptoms of daily sob x 50 ft reported are markedly disproportionate to objective findings and not clear at all  this is a lung problem but pt does appear to have difficult airway management issues.  DDX of  difficult airways management all start with A and  include Adherence, Ace Inhibitors, Acid Reflux, Active Sinus Disease, Alpha 1 Antitripsin deficiency, Anxiety masquerading as Airways dz,  ABPA,  allergy(esp in young), Aspiration (esp in elderly), Adverse effects of DPI,  Active smokers, plus two Bs  = Bronchiectasis and Beta blocker use..and one C= CHF  Adherence is always the initial "prime suspect" and is a multilayered concern that requires a "trust but verify" approach in every patient - starting with knowing how to use medications, especially inhalers, correctly, keeping up with refills and understanding the fundamental difference between maintenance and prns vs those medications only taken for a very short course and then stopped and not refilled.   ? Acid (or non-acid) GERD > always difficult to exclude as up to 75% of pts in some series report no assoc GI/ Heartburn symptoms> rec max (24h)  acid suppression and diet restrictions/ reviewed and instructions given in writing.   ? Active sinus dz/ upper airway cough syndrome > low threshold to do sinus ct but for now just try tylenol cold and sinus and GERD rx as above   I had an extended discussion with the patient reviewing all relevant studies completed to date and  Lasting 35minutes  on the following ongoing concerns:   Each maintenance medication was reviewed in detail including most importantly the difference between maintenance and as needed and under what circumstances the prns are to be used.  Please see instructions for details which were reviewed in writing and the patient given a copy.    Needs pfts to complete the w/u > scheduled

## 2015-03-19 NOTE — Progress Notes (Signed)
Quick Note:  Spoke with pt and notified of results per Dr. Wert. Pt verbalized understanding and denied any questions.  ______ 

## 2015-03-22 ENCOUNTER — Ambulatory Visit
Admission: RE | Admit: 2015-03-22 | Discharge: 2015-03-22 | Disposition: A | Discharge status: Home / Self Care | Payer: 59 | Source: Ambulatory Visit | Attending: Obstetrics and Gynecology | Admitting: Obstetrics and Gynecology

## 2015-03-22 DIAGNOSIS — N6001 Solitary cyst of right breast: Secondary | ICD-10-CM

## 2015-04-29 ENCOUNTER — Other Ambulatory Visit: Payer: Self-pay | Admitting: Internal Medicine

## 2015-04-29 DIAGNOSIS — R0600 Dyspnea, unspecified: Secondary | ICD-10-CM

## 2015-04-30 ENCOUNTER — Encounter: Payer: Self-pay | Admitting: Internal Medicine

## 2015-04-30 ENCOUNTER — Ambulatory Visit (INDEPENDENT_AMBULATORY_CARE_PROVIDER_SITE_OTHER): Payer: 59 | Admitting: Internal Medicine

## 2015-04-30 VITALS — BP 112/84 | HR 73 | Ht 64.0 in | Wt 275.0 lb

## 2015-04-30 DIAGNOSIS — D869 Sarcoidosis, unspecified: Secondary | ICD-10-CM

## 2015-04-30 DIAGNOSIS — R0600 Dyspnea, unspecified: Secondary | ICD-10-CM

## 2015-04-30 LAB — PULMONARY FUNCTION TEST
DL/VA % PRED: 80 %
DL/VA: 3.85 ml/min/mmHg/L
DLCO UNC % PRED: 58 %
DLCO UNC: 14.25 ml/min/mmHg
FEF 25-75 PRE: 1.62 L/s
FEF 25-75 Post: 1.63 L/sec
FEF2575-%Change-Post: 1 %
FEF2575-%PRED-POST: 88 %
FEF2575-%Pred-Pre: 87 %
FEV1-%Change-Post: 0 %
FEV1-%Pred-Post: 85 %
FEV1-%Pred-Pre: 86 %
FEV1-Post: 1.67 L
FEV1-Pre: 1.67 L
FEV1FVC-%Change-Post: 2 %
FEV1FVC-%PRED-PRE: 105 %
FEV6-%CHANGE-POST: -3 %
FEV6-%Pred-Post: 81 %
FEV6-%Pred-Pre: 84 %
FEV6-POST: 1.95 L
FEV6-PRE: 2.03 L
FEV6FVC-%PRED-POST: 104 %
FEV6FVC-%Pred-Pre: 104 %
FVC-%CHANGE-POST: -2 %
FVC-%PRED-POST: 79 %
FVC-%PRED-PRE: 81 %
FVC-POST: 1.97 L
FVC-PRE: 2.03 L
POST FEV6/FVC RATIO: 100 %
PRE FEV6/FVC RATIO: 100 %
Post FEV1/FVC ratio: 85 %
Pre FEV1/FVC ratio: 82 %

## 2015-04-30 NOTE — Assessment & Plan Note (Signed)
-   03/18/2015   Walked RA x 3 lap @ 1285ft each/ brisk pace / tol well s desats - PFTs 04/30/2015    VC 2.32 (93%) no obst dlco 58% corrects 80%      I had an extended final summary discussion with the patient reviewing all relevant studies completed to date and  lasting 15 to 20 minutes of a 25 minute visit on the following issues:    1) her obesity is her #1 lung problem, no need for pulmonary f/u  2) Each maintenance medication was reviewed in detail including most importantly the difference between maintenance and as needed and under what circumstances the prns are to be used.  Please see instructions for details which were reviewed in writing and the patient given a copy.

## 2015-04-30 NOTE — Assessment & Plan Note (Signed)
No evidence of significant restrictive lung dz on today's pfts

## 2015-04-30 NOTE — Assessment & Plan Note (Addendum)
Body mass index is 47.18 kg/(m^2).  Lab Results  Component Value Date   TSH 3.39 03/18/2015     Contributing to gerd tendency/ doe with mild hypercarbia (could have early OHS)  needs to achieve and maintain neg calorie balance >  f/u primary care

## 2015-04-30 NOTE — Progress Notes (Signed)
Subjective:    Patient ID: Wendy Downs, female    DOB: 1951/08/03, 64 y.o.   MRN: [id number]  HPI    85 yobf never smoker remotely followed by Dr Lenna Gilford / clance for sarcoid and referred back to pulmonary clinic 03/18/2015 by Dr Carol Ada for eval of sob ? Sarcoid active    03/18/2015 1st  office visit/ Wendy Downs   Chief Complaint  Patient presents with  . 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    last seen 5 years prior to OV   and not on any  prednisone since even before then, main manifestation was ocular > regular f/u for glaucoma/ [facility]  New problem indolent onset progressive/ persistent daily doe x May 2016 indolent onset with exertion initially now sob x across room and also sitting still, hear's wheezing at hs and has had episodes of cough to point of choking p smelling grilled food 03/17/15 assoc with sense of nasal and chest congestion but cough dry/ no purulent secretions/ worse day than noct  rec Ok to try tylenol cold and sinus and call us if not better  Please remember to go to the  Lab and x-ray department downstairs for your tests - we will call you with the results when they are available. Try prilosec 16m  Take 30-60 min before first meal of the day and Pepcid (famotidine) 20 mg one bedtime until cough is completely gone for at least a week without the need for cough suppression GERD diet    04/30/2015 f/u ov/Wendy Downs re: doe ? All Obesity  Chief Complaint  Patient presents with  . Follow-up    DOE has gotten better since she started taking Zyrtec. PFT done today.      Not limited by breathing from desired activities  But not very active  Only took gerd rx x one week then abd cramps and stopped both ppi and h2 and the cramps resolved   No obvious day to day or daytime variability or assoc chronic cough or cp or chest tightness, subjective wheeze or overt sinus or hb symptoms. No unusual exp hx or h/o childhood pna/ asthma or knowledge of  premature birth.  Sleeping ok without nocturnal  or early am exacerbation  of respiratory  c/o's or need for noct saba. Also denies any obvious fluctuation of symptoms with weather or environmental changes or other aggravating or alleviating factors except as outlined above   Current Medications, Allergies, Complete Past Medical History, Past Surgical History, Family History, and Social History were reviewed in [employer].  ROS  The following are not active complaints unless bolded sore throat, dysphagia, dental problems, itching, sneezing,  nasal congestion or excess/ purulent secretions, ear ache,   fever, chills, sweats, unintended wt loss, classically pleuritic or exertional cp, hemoptysis,  orthopnea pnd or leg swelling, presyncope, palpitations, abdominal pain, anorexia, nausea, vomiting, diarrhea  or change in bowel or bladder habits, change in stools or urine, dysuria,hematuria,  rash, arthralgias, visual complaints, headache, numbness, weakness or ataxia or problems with walking or coordination,  change in mood/affect or memory.           Objective:   Physical Exam  amb obese bf nad  04/30/2015       275   Wt Readings from Last 3 Encounters:  03/18/15 272 lb (123.378 kg)  05/24/14 275 lb 6.4 oz (124.921 kg)  02/20/10 264 lb (119.75 kg)  Vital signs reviewed    HEENT: nl dentition, turbinates, and orophanx. Nl external ear canals without cough reflex   NECK :  without JVD/Nodes/TM/ nl carotid upstrokes bilaterally   LUNGS: no acc muscle use, clear to A and P bilaterally without cough on insp or exp maneuvers   CV:  RRR  no s3 or murmur or increase in P2, no edema   ABD:  soft and nontender with nl excursion in the supine position. No bruits or organomegaly, bowel sounds nl  MS:  warm without deformities, calf tenderness, cyanosis or clubbing  SKIN: warm and dry without lesions    NEURO:  alert, approp, no deficits   CXR PA and  Lateral:   03/18/2015 :     I personally reviewed images and agree with radiology impression as follows:      improved vs last cxr 2011 with better aeration bilaterally but persistent bilateral hilar adenopathy  Labs reviewed ESR  41/  ACE 41   Labs ordered/ reviewed:    Lab 03/18/15 1701  NA 136  K 4.3  CL 99  CO2 33*  BUN 20  CREATININE 1.27*  GLUCOSE 118*      Lab 03/18/15 1701  HGB 13.4  HCT 40.5  WBC 7.8  PLT 360.0     Lab Results  Component Value Date   TSH 3.39 03/18/2015     Lab Results  Component Value Date   PROBNP 29.0 03/18/2015             Assessment & Plan:   Outpatient Encounter Prescriptions as of 04/30/2015  Medication Sig  . amLODipine (NORVASC) 5 MG tablet Take 5 mg by mouth daily.  . B Complex Vitamins (VITAMIN B COMPLEX IJ) Inject 1 mL as directed every 30 (thirty) days. Vitamin b12 injection  . brimonidine (ALPHAGAN P) 0.1 % SOLN Place 1 drop into both eyes 2 (two) times daily.   . cetirizine (ZYRTEC) 10 MG tablet Take 10 mg by mouth daily.  . furosemide (LASIX) 20 MG tablet Take 40 mg by mouth daily.   . Multiple Vitamins-Minerals (MEGA MULTI WOMEN PO) Take by mouth.  . Nebivolol HCl (BYSTOLIC) 20 MG TABS Take by mouth.  . olmesartan (BENICAR) 40 MG tablet Take 40 mg by mouth daily.  . sodium chloride (MURO 128) 5 % ophthalmic solution Place 1 drop into the left eye 4 (four) times daily as needed for irritation.  . timolol (BETIMOL) 0.25 % ophthalmic solution Place 1-2 drops into both eyes 2 (two) times daily.   . travoprost, benzalkonium, (TRAVATAN) 0.004 % ophthalmic solution Place 1 drop into both eyes at bedtime.   . [DISCONTINUED] famotidine (PEPCID) 20 MG tablet One at bedtime (Patient not taking: Reported on 04/30/2015)  . [DISCONTINUED] Multiple Vitamins-Minerals (A[place] PO) Take 1 tablet by mouth daily as needed.   . [DISCONTINUED] pantoprazole (PROTONIX) 40 MG tablet Take 1 tablet (40 mg total) by mouth daily. Take 30-60 min  before first meal of the day (Patient not taking: Reported on 04/30/2015)   No facility-administered encounter medications on file as of 04/30/2015.

## 2015-04-30 NOTE — Progress Notes (Signed)
PFT done today. 

## 2015-04-30 NOTE — Patient Instructions (Signed)
Your lung function is excellent so the main challenge long term keeping you healthy is getting your weight back under control by getting yourself into a negative calorie balance and following  up with Dr Katrinka BlazingSmith to keep working on this / monitoring your progress   Pulmonary follow up is as needed

## 2015-07-11 ENCOUNTER — Encounter: Payer: 59 | Attending: Family Medicine

## 2015-07-11 VITALS — Ht 64.0 in | Wt 277.8 lb

## 2015-07-11 DIAGNOSIS — E119 Type 2 diabetes mellitus without complications: Secondary | ICD-10-CM | POA: Diagnosis not present

## 2015-07-11 DIAGNOSIS — Z713 Dietary counseling and surveillance: Secondary | ICD-10-CM | POA: Insufficient documentation

## 2015-07-11 NOTE — Progress Notes (Signed)
Patient was seen on 07/11/15 for the first of a series of three diabetes self-management courses at [facility].  Patient Education Plan per assessed needs and concerns is to attend four course education program for Diabetes Self Management Education.  The following learning objectives were met by the patient during this class:  Describe diabetes  State some common risk factors for diabetes  Defines the role of glucose and insulin  Identifies type of diabetes and pathophysiology  Describe the relationship between diabetes and cardiovascular risk  State the members of the Healthcare Team  States the rationale for glucose monitoring  State when to test glucose  State their individual Target Range  State the importance of logging glucose readings  Describe how to interpret glucose readings  Identifies A1C target  Explain the correlation between A1c and eAG values  State symptoms and treatment of high blood glucose  State symptoms and treatment of low blood glucose  Explain proper technique for glucose testing  Identifies proper sharps disposal  Handouts given during class include:  Living Well with Diabetes book  Carb Counting and Meal Planning book  Meal Plan Card  Carbohydrate guide  Meal planning worksheet  Low Sodium Flavoring Tips  The diabetes portion plate  W5L to eAG Conversion Chart  Diabetes Medications  Diabetes Recommended Care Schedule  Support Group  Diabetes Success Plan  Core Class Satisfaction Survey  Follow-Up Plan:  Attend core 2

## 2015-07-18 ENCOUNTER — Encounter: Payer: 59 | Attending: Family Medicine

## 2015-07-18 DIAGNOSIS — E119 Type 2 diabetes mellitus without complications: Secondary | ICD-10-CM | POA: Diagnosis not present

## 2015-07-18 DIAGNOSIS — Z713 Dietary counseling and surveillance: Secondary | ICD-10-CM | POA: Insufficient documentation

## 2015-07-18 NOTE — Progress Notes (Signed)

## 2015-07-25 ENCOUNTER — Encounter: Payer: 59 | Attending: Family Medicine

## 2015-07-25 DIAGNOSIS — E119 Type 2 diabetes mellitus without complications: Secondary | ICD-10-CM

## 2015-07-25 DIAGNOSIS — Z713 Dietary counseling and surveillance: Secondary | ICD-10-CM | POA: Diagnosis not present

## 2015-07-25 NOTE — Progress Notes (Signed)
Patient was seen on 07/25/15 for the third of a series of three diabetes self-management courses at the [facility].   Wendy Downs. State the amount of activity recommended for healthy living . Describe activities suitable for individual needs . Identify ways to regularly incorporate activity into daily life . Identify barriers to activity and ways to over come these barriers  Identify diabetes medications being personally used and their primary action for lowering glucose and possible side effects . Describe role of stress on blood glucose and develop strategies to address psychosocial issues . Identify diabetes complications and ways to prevent them  Explain how to manage diabetes during illness . Evaluate success in meeting personal goal . Establish 2-3 goals that they will plan to diligently work on until they return for the  5951-month follow-up visit  Goals:   I will count my carb choices at most meals and snacks  I will be active 30 minutes or more 5 times a week  Your patient has identified these potential barriers to change:  Motivation  Your patient has identified their diabetes self-care support plan as  Family Support Plan:  Attend Optional Core 4 in 4 months

## 2015-09-03 ENCOUNTER — Other Ambulatory Visit: Payer: Self-pay

## 2015-09-03 ENCOUNTER — Other Ambulatory Visit: Payer: Self-pay | Admitting: Obstetrics and Gynecology

## 2015-09-03 DIAGNOSIS — N6310 Unspecified lump in the right breast, unspecified quadrant: Secondary | ICD-10-CM

## 2015-09-30 ENCOUNTER — Ambulatory Visit
Admission: RE | Admit: 2015-09-30 | Discharge: 2015-09-30 | Disposition: A | Discharge status: Home / Self Care | Payer: 59 | Source: Ambulatory Visit | Attending: Obstetrics and Gynecology | Admitting: Obstetrics and Gynecology

## 2015-09-30 DIAGNOSIS — N6310 Unspecified lump in the right breast, unspecified quadrant: Secondary | ICD-10-CM

## 2015-10-09 ENCOUNTER — Ambulatory Visit: Payer: 59 | Admitting: Cardiology

## 2015-12-04 ENCOUNTER — Ambulatory Visit: Payer: 59

## 2015-12-25 ENCOUNTER — Ambulatory Visit: Payer: 59 | Admitting: Cardiology

## 2016-02-07 ENCOUNTER — Ambulatory Visit: Payer: 59 | Admitting: Cardiology

## 2016-03-05 ENCOUNTER — Ambulatory Visit (INDEPENDENT_AMBULATORY_CARE_PROVIDER_SITE_OTHER): Payer: 59 | Admitting: Cardiology

## 2016-03-05 ENCOUNTER — Encounter: Payer: Self-pay | Admitting: Cardiology

## 2016-03-05 VITALS — BP 120/70 | HR 66 | Ht 64.0 in | Wt 266.0 lb

## 2016-03-05 DIAGNOSIS — G43109 Migraine with aura, not intractable, without status migrainosus: Secondary | ICD-10-CM

## 2016-03-05 DIAGNOSIS — I10 Essential (primary) hypertension: Secondary | ICD-10-CM

## 2016-03-05 NOTE — Progress Notes (Signed)
[address] [place], [place]  [address] Phone: [phone] Fax:  [phone]  Date:  03/05/2016   ID:  Wendy Downs, DOB [birth year], MRN [id number]  PCP:  Allean FoundSMITH,CANDACE THIELE, MD   History of Present Illness: Wendy Lagosatricia A Rudie is a 65 y.o. female with hx of difficult to control hypertension, morbid obesity, and hyperlipidemia. Nonsmoker. BMI 41. HTN since [age]year old. Normal renal duplex. She continues to deny any symptoms such as chest pain, shortness of breath, syncope, nor palpitations. She has had edema on higher dose amlodipine and gout on HCTZ. After BP 196/104,  tried Spironolactone 25 mg po qd. however, BUN and creatinine increased. She has been trying to eat healthier with more fresh fruits and vegetables.   Saw Dr. Sherene SiresWert 04/30/15 - no PFT restrictive evidence of sarcoid. Obesity is primary problem, no need for pulm fu.   A1c-6.9 -weight watchers, 8 pounds loss. Migraines - triggered by lights. Feeling well otherwise. Mild edema.    Wt Readings from Last 3 Encounters:  03/05/16 266 lb (120.657 kg)  07/11/15 277 lb 12.8 oz (126.009 kg)  04/30/15 275 lb (124.739 kg)     Past Medical History  Diagnosis Date  . Hypertension   . Hyperlipidemia   . Gout   . Shingles   . Morbid obesity (HCC)   . Diabetes mellitus without complication [facility](HCC)     Past Surgical History  Procedure Laterality Date  . Appendectomy    . Cholecystectomy    . Carpal tunnel release      Current Outpatient Prescriptions  Medication Sig Dispense Refill  . allopurinol (ZYLOPRIM) 100 MG tablet Take 100 mg by mouth daily.    Marland Kitchen. amLODipine (NORVASC) 5 MG tablet Take 5 mg by mouth daily.    . B Complex Vitamins (VITAMIN B COMPLEX IJ) Inject 1 mL as directed every 30 (thirty) days. Vitamin b12 injection    . brimonidine (ALPHAGAN P) 0.1 % SOLN Place 1 drop into both eyes 2 (two) times daily.     . furosemide (LASIX) 20 MG tablet Take 40 mg by mouth daily.     . indomethacin  (INDOCIN) 50 MG capsule Take 50 mg by mouth 2 (two) times daily with a meal.    . metFORMIN (GLUCOPHAGE) 500 MG tablet Take 500 mg by mouth daily.    . Multiple Vitamins-Minerals (MEGA MULTI WOMEN PO) Take by mouth.    . naproxen sodium (ANAPROX) 220 MG tablet Take 220 mg by mouth daily.    . Nebivolol HCl (BYSTOLIC) 20 MG TABS Take 20 mg by mouth daily.    Marland Kitchen. olmesartan (BENICAR) 40 MG tablet Take 40 mg by mouth daily.    . timolol (BETIMOL) 0.25 % ophthalmic solution Place 1-2 drops into both eyes 2 (two) times daily.     . travoprost, benzalkonium, (TRAVATAN) 0.004 % ophthalmic solution Place 1 drop into both eyes at bedtime.      No current facility-administered medications for this visit.    Allergies:    Allergies  Allergen Reactions  . Clonidine Derivatives Other (See Comments)    Ear Pain, Dizziness, Insomnia, migraine, constipation. Patient stated that medicine kept her off balance.  . Prednisone Other (See Comments)    Mood swings  . Valtrex [Valacyclovir Hcl] Other (See Comments)    Doesn't remember   . Vaseretic [Enalapril-Hydrochlorothiazide] Other (See Comments)    Gout     Social History:  The patient  reports that  she has never smoked. She has never used smokeless tobacco. She reports that she does not drink alcohol or use illicit drugs.   Family History  Problem Relation Age of Onset  . Heart attack Mother   . Hypertension Mother   . Stroke Father   . Heart disease Sister   . Stroke Sister   . Heart disease Brother   . Stroke Brother   . Stroke Sister   . Aneurysm Brother     ROS:  Please see the history of present illness.   Denies any syncope, bleeding, orthopnea, PND   All other systems reviewed and negative.   PHYSICAL EXAM: VS:  BP 120/70 mmHg  Pulse 66  Ht  (1.626 m)  Wt 266 lb (120.657 kg)  BMI 45.64 kg/m2 Well nourished, well developed, in no acute distress HEENT: normal, [place]/AT, EOMI Neck: no JVD, normal carotid upstroke, no  bruit Cardiac:  normal S1, S2; RRR; no murmur Lungs:  clear to auscultation bilaterally, no wheezing, rhonchi or rales Abd: soft, nontender, no hepatomegaly, no bruitsobese Ext: Mild pedal edema, 2+ distal pulses Skin: warm and dry GU: deferred Neuro: no focal abnormalities noted, AAO x 3  EKG:  EKG was ordered today 03/05/16-sinus rhythm, 66, no other abnormalities. 05/24/14-sinus rhythm, 65, no other abnormalities, poor R-wave progression.     ASSESSMENT AND PLAN:  1. Hypertension- improved. In the past, we have tried spironolactone however she ended up with prerenal azotemia, BUN 62, creatinine 2. Her creatinine currently is in the 1 range. Avoid heavy diuresis. Her blood pressure will be helped tremendously also with weight loss. Doing a good job at this point. Try clonidine but had some side effects on this. Medications reviewed. 2. Morbid obesity-tried Weight Watchers. Encouragement. She is very motivated. She is about to retire from [employer]. We discussed weight loss at length today. 3. Migraine - Aleve helps x 2. Rarely takes. Seems to better after adjusting the fluorescent lights. Fragrances can worsen as well. 4. New diagnosis of diabetes type 2-metformin, working hard on weight loss. 5. We'll see back on as-needed basis.  Signed, Donato Schultz, MD [facility]  03/05/2016 8:03 AM

## 2016-03-05 NOTE — Patient Instructions (Signed)
Medication Instructions:  The current medical regimen is effective;  continue present plan and medications.  Follow-Up: Follow up as needed with Dr Skains.  Thank you for choosing [facility]!!     

## 2016-08-25 ENCOUNTER — Other Ambulatory Visit: Payer: Self-pay | Admitting: Obstetrics and Gynecology

## 2016-08-25 DIAGNOSIS — Z1231 Encounter for screening mammogram for malignant neoplasm of breast: Secondary | ICD-10-CM

## 2016-09-30 ENCOUNTER — Ambulatory Visit
Admission: RE | Admit: 2016-09-30 | Discharge: 2016-09-30 | Disposition: A | Discharge status: Home / Self Care | Payer: 59 | Source: Ambulatory Visit | Attending: Obstetrics and Gynecology | Admitting: Obstetrics and Gynecology

## 2016-09-30 DIAGNOSIS — Z1231 Encounter for screening mammogram for malignant neoplasm of breast: Secondary | ICD-10-CM

## 2016-10-01 ENCOUNTER — Other Ambulatory Visit: Payer: Self-pay | Admitting: *Deleted

## 2016-10-01 MED ORDER — NEBIVOLOL HCL 20 MG PO TABS: 20.0000 mg | ORAL_TABLET | Freq: Every day | ORAL | 0 refills | Status: DC

## 2016-10-01 NOTE — Telephone Encounter (Signed)
Pt notified per pharmacy notification that 30 dys Bystolic cost the same as the 90 dys and so we sent in 90 dys Bystolic . Pt said thank you.

## 2016-12-07 ENCOUNTER — Other Ambulatory Visit: Payer: Self-pay | Admitting: Family Medicine

## 2016-12-07 ENCOUNTER — Ambulatory Visit
Admission: RE | Admit: 2016-12-07 | Discharge: 2016-12-07 | Disposition: A | Discharge status: Home / Self Care | Payer: 59 | Source: Ambulatory Visit | Attending: Family Medicine | Admitting: Family Medicine

## 2016-12-07 DIAGNOSIS — R059 Cough, unspecified: Secondary | ICD-10-CM

## 2016-12-07 DIAGNOSIS — R05 Cough: Secondary | ICD-10-CM

## 2017-01-25 ENCOUNTER — Other Ambulatory Visit: Payer: Self-pay | Admitting: Cardiology

## 2017-02-11 ENCOUNTER — Encounter: Payer: Self-pay | Admitting: Cardiology

## 2017-02-11 ENCOUNTER — Ambulatory Visit (INDEPENDENT_AMBULATORY_CARE_PROVIDER_SITE_OTHER): Payer: 59 | Admitting: Cardiology

## 2017-02-11 VITALS — BP 134/88 | HR 76 | Ht 64.0 in | Wt 279.0 lb

## 2017-02-11 DIAGNOSIS — R002 Palpitations: Secondary | ICD-10-CM

## 2017-02-11 DIAGNOSIS — I10 Essential (primary) hypertension: Secondary | ICD-10-CM | POA: Diagnosis not present

## 2017-02-11 DIAGNOSIS — R0600 Dyspnea, unspecified: Secondary | ICD-10-CM | POA: Diagnosis not present

## 2017-02-11 NOTE — Progress Notes (Signed)
[address] [place], [place]  [address] Phone: [phone] Fax:  [phone]  Date:  02/11/2017   ID:  [name], DOB [birth year], MRN [id number]  PCP:  Allean Found, MD   History of Present Illness: Wendy Downs is a 66 y.o. female with hx of difficult to control hypertension, morbid obesity, and hyperlipidemia here for evaluation of irregular heartbeat noticed by her OB/GYN. She herself, the patient, did not notice necessarily any irregularity. She has shortness of breath that has been chronic. No chest pain, no syncope. No orthopnea.  Has never smoked in her life. She has battled with hypertension for several years, since she was 70. Prior secondary workup was unremarkable including renal duplex Dopplers, normal.   Previous medications tried for hypertension include spironolactone however she developed dehydration from over diuresis. She has also tried higher dose amlodipine but developed worsening edema.  Had gout on HCTZ.  Saw Dr. Sherene Sires 04/30/15 - no PFT restrictive evidence of sarcoid. Obesity is primary problem, no need for pulm fu.   A1c-6.9 -weight watchers, 8 pounds loss. Migraines - triggered by lights. Feeling well otherwise. Mild edema.     Wt Readings from Last 3 Encounters:  02/11/17 279 lb (126.6 kg)  03/05/16 266 lb (120.7 kg)  07/11/15 277 lb 12.8 oz (126 kg)     Past Medical History:  Diagnosis Date  . Diabetes mellitus without complication (HCC)   . Gout   . Hyperlipidemia   . Hypertension   . Morbid obesity (HCC)   . Shingles     Past Surgical History:  Procedure Laterality Date  . APPENDECTOMY    . CARPAL TUNNEL RELEASE    . CHOLECYSTECTOMY      Current Outpatient Prescriptions  Medication Sig Dispense Refill  . allopurinol (ZYLOPRIM) 100 MG tablet Take 100 mg by mouth daily.    Marland Kitchen amLODipine (NORVASC) 5 MG tablet Take 5 mg by mouth daily.    . B Complex Vitamins (VITAMIN B COMPLEX IJ) Inject 1 mL as  directed every 30 (thirty) days. Vitamin b12 injection    . brimonidine (ALPHAGAN P) 0.1 % SOLN Place 1 drop into both eyes 2 (two) times daily.     Marland Kitchen BYSTOLIC 20 MG TABS TAKE 1 TABLET (20 MG TOTAL) BY MOUTH DAILY. 30 tablet 0  . cetirizine (ZYRTEC) 10 MG tablet Take 10 mg by mouth daily.    . furosemide (LASIX) 20 MG tablet Take 40 mg by mouth daily.     . indomethacin (INDOCIN) 50 MG capsule Take 50 mg by mouth 2 (two) times daily with a meal.    . latanoprost (XALATAN) 0.005 % ophthalmic solution Place 1 drop into both eyes 2 (two) times daily.    . Multiple Vitamins-Minerals (MEGA MULTI WOMEN PO) Take by mouth.    . olmesartan (BENICAR) 40 MG tablet Take 40 mg by mouth daily.    . Omega-3 1000 MG CAPS Take 1 capsule by mouth daily.    . timolol (BETIMOL) 0.25 % ophthalmic solution Place 1-2 drops into both eyes 2 (two) times daily.     . TURMERIC PO Take 1 capsule by mouth daily.     No current facility-administered medications for this visit.     Allergies:    Allergies  Allergen Reactions  . Clonidine Derivatives Other (See Comments)    Ear Pain, Dizziness, Insomnia, migraine, constipation. Patient stated that medicine kept her off balance.  . Prednisone Other (  See Comments)    Mood swings  . Valtrex [Valacyclovir Hcl] Other (See Comments)    Doesn't remember   . Vaseretic [Enalapril-Hydrochlorothiazide] Other (See Comments)    Gout     Social History:  The patient  reports that she has never smoked. She has never used smokeless tobacco. She reports that she does not drink alcohol or use drugs.   Family History  Problem Relation Age of Onset  . Heart attack Mother   . Hypertension Mother   . Stroke Father   . Heart disease Sister   . Stroke Sister   . Heart disease Brother   . Stroke Brother   . Stroke Sister   . Aneurysm Brother     ROS:  Please see the history of present illness.   Denies any syncope, bleeding, orthopnea, PND   Unless specified above all other  review of systems negative.Marland Kitchen.   PHYSICAL EXAM: VS:  BP 134/88   Pulse 76   Ht 5\' 4"  (1.626 m)   Wt 279 lb (126.6 kg)   SpO2 97%   BMI 47.89 kg/m  GEN: Well nourished, well developed, in no acute distress  HEENT: normal  Neck: no JVD, carotid bruits, or masses Cardiac: RRR; no murmurs, rubs, or gallops,no edema  Respiratory:  clear to auscultation bilaterally, normal work of breathing GI: soft, nontender, nondistended, + BS, obese MS: no deformity or atrophy  Skin: warm and dry, no rash Neuro:  Alert and Oriented x 3, Strength and sensation are intact Psych: euthymic mood, full affect   EKG:  EKG was ordered today 03/05/16-sinus rhythm, 66, no other abnormalities. 05/24/14-sinus rhythm, 65, no other abnormalities, poor R-wave progression.     ASSESSMENT AND PLAN:  Shortness of breath/dyspnea  - Likely multifactorial but certainly morbid obesity is playing a major role. Continue to encourage conditioning.  - We will check an echocardiogram to evaluate structure and function.  Irregular heartbeat  - Today, her EKG is normal, no ectopic beats. I did not appreciate any ectopy beats when listening to her on auscultation. She may been having frequent PVCs or PACs. We will continue to monitor her for irregularity. If necessary, we can always place an event monitor.  Essential hypertension  - Previously challenging to control. Tried to utilize spironolactone but she ended up with dehydration/prerenal azotemia with creatinine of 2, BUN of 62.  - Trying to avoid heavy diuresis.  - At one point she tried clonidine but she had side effects.  Morbid obesity  - Has tried Weight Watchers, quite motivated.  Diabetes with hypertension  - Metformin. Encourage weight loss.  - Encourage statin use with diagnosis of diabetes.  OSA  - CPAP in 10/2016  Left eye blindness  - [facility] .    Signed, Donato SchultzMark Skains, MD [facility]  02/11/2017 4:11 PM

## 2017-02-11 NOTE — Patient Instructions (Signed)

## 2017-02-25 ENCOUNTER — Other Ambulatory Visit: Payer: Self-pay

## 2017-02-25 ENCOUNTER — Ambulatory Visit (HOSPITAL_COMMUNITY): Payer: 59 | Attending: Cardiovascular Disease

## 2017-02-25 DIAGNOSIS — I071 Rheumatic tricuspid insufficiency: Secondary | ICD-10-CM | POA: Insufficient documentation

## 2017-02-25 DIAGNOSIS — E119 Type 2 diabetes mellitus without complications: Secondary | ICD-10-CM | POA: Diagnosis not present

## 2017-02-25 DIAGNOSIS — I10 Essential (primary) hypertension: Secondary | ICD-10-CM | POA: Diagnosis not present

## 2017-02-25 DIAGNOSIS — R0600 Dyspnea, unspecified: Secondary | ICD-10-CM | POA: Insufficient documentation

## 2017-03-11 ENCOUNTER — Other Ambulatory Visit (INDEPENDENT_AMBULATORY_CARE_PROVIDER_SITE_OTHER): Payer: 59

## 2017-03-11 ENCOUNTER — Ambulatory Visit (INDEPENDENT_AMBULATORY_CARE_PROVIDER_SITE_OTHER): Payer: 59 | Admitting: Internal Medicine

## 2017-03-11 ENCOUNTER — Ambulatory Visit (INDEPENDENT_AMBULATORY_CARE_PROVIDER_SITE_OTHER)
Admission: RE | Admit: 2017-03-11 | Discharge: 2017-03-11 | Disposition: A | Discharge status: Home / Self Care | Payer: 59 | Source: Ambulatory Visit | Attending: Internal Medicine | Admitting: Internal Medicine

## 2017-03-11 ENCOUNTER — Encounter: Payer: Self-pay | Admitting: Internal Medicine

## 2017-03-11 VITALS — BP 126/78 | HR 55 | Ht 64.0 in | Wt 279.0 lb

## 2017-03-11 DIAGNOSIS — D869 Sarcoidosis, unspecified: Secondary | ICD-10-CM | POA: Diagnosis not present

## 2017-03-11 DIAGNOSIS — R0609 Other forms of dyspnea: Secondary | ICD-10-CM

## 2017-03-11 LAB — CBC WITH DIFFERENTIAL/PLATELET
BASOS ABS: 0 10*3/uL (ref 0.0–0.1)
Basophils Relative: 0.6 % (ref 0.0–3.0)
Eosinophils Absolute: 0.2 10*3/uL (ref 0.0–0.7)
Eosinophils Relative: 3.2 % (ref 0.0–5.0)
HEMATOCRIT: 38.3 % (ref 36.0–46.0)
HEMOGLOBIN: 12.9 g/dL (ref 12.0–15.0)
LYMPHS ABS: 2.3 10*3/uL (ref 0.7–4.0)
LYMPHS PCT: 33.1 % (ref 12.0–46.0)
MCHC: 33.6 g/dL (ref 30.0–36.0)
MCV: 89.2 fl (ref 78.0–100.0)
MONOS PCT: 9.6 % (ref 3.0–12.0)
Monocytes Absolute: 0.7 10*3/uL (ref 0.1–1.0)
NEUTROS PCT: 53.5 % (ref 43.0–77.0)
Neutro Abs: 3.6 10*3/uL (ref 1.4–7.7)
Platelets: 301 10*3/uL (ref 150.0–400.0)
RBC: 4.29 Mil/uL (ref 3.87–5.11)
RDW: 13.8 % (ref 11.5–15.5)
WBC: 6.8 10*3/uL (ref 4.0–10.5)

## 2017-03-11 LAB — TSH: TSH: 2.84 u[IU]/mL (ref 0.35–4.50)

## 2017-03-11 LAB — BRAIN NATRIURETIC PEPTIDE: PRO B NATRI PEPTIDE: 164 pg/mL — AB (ref 0.0–100.0)

## 2017-03-11 NOTE — Patient Instructions (Signed)
GERD (REFLUX)  is an extremely common cause of respiratory symptoms just like yours , many times with no obvious heartburn at all.    It can be treated with medication, but also with lifestyle changes including elevation of the head of your bed (ideally with 6 inch  bed blocks),  Smoking cessation, avoidance of late meals, excessive alcohol, and avoid fatty foods, chocolate, peppermint, colas, red wine, and acidic juices such as orange juice.  NO MINT OR MENTHOL PRODUCTS SO NO COUGH DROPS   USE SUGARLESS CANDY INSTEAD (Jolley ranchers or Stover's or Life Savers) or even ice chips will also do - the key is to swallow to prevent all throat clearing. NO OIL BASED VITAMINS - use powdered substitutes.   Please remember to go to the lab and x-ray department downstairs in the basement  for your tests - we will call you with the results when they are available.     Please schedule a follow up office visit in 4 weeks, sooner if needed with full pfts on return (or first available)

## 2017-03-11 NOTE — Progress Notes (Signed)
Subjective:    Patient ID: Wendy Downs, female    DOB: 07/29/1951    MRN: [id number]   Brief patient profile:  14 yobf never smoker remotely followed by Dr Kriste Basque / clance for sarcoid and referred back to pulmonary clinic 03/18/2015 by Dr Merri Brunette for eval of sob ? Sarcoid active     History of Present Illness  03/18/2015 1st  office visit/ Wert   Chief Complaint  Patient presents with  . Advice Only    Old [facility] pt here to re-establish for sarcoidosis.    last seen 5 years prior to OV   and not on any  prednisone since even before then, main manifestation was ocular > regular f/u for glaucoma/ [facility]  New problem indolent onset progressive/ persistent daily doe x May 2016 indolent onset with exertion initially now sob x across room and also sitting still, hear's wheezing at hs and has had episodes of cough to point of choking p smelling grilled food 03/17/15 assoc with sense of nasal and chest congestion but cough dry/ no purulent secretions/ worse day than noct  rec Ok to try tylenol cold and sinus and call us if not better  Please remember to go to the  Lab and x-ray department downstairs for your tests - we will call you with the results when they are available. Try prilosec 20mg   Take 30-60 min before first meal of the day and Pepcid (famotidine) 20 mg one bedtime until cough is completely gone for at least a week without the need for cough suppression GERD diet    04/30/2015 f/u ov/Wert re: doe ? All Obesity  Chief Complaint  Patient presents with  . Follow-up    DOE has gotten better since she started taking Zyrtec. PFT done today.  Not limited by breathing from desired activities  But not very active  Only took gerd rx x one week then abd cramps and stopped both ppi and h2 and the cramps resolved  rec Your lung function is excellent so the main challenge long term keeping you healthy is getting your weight back under control by getting yourself into a negative calorie  balance and following  up with Dr Katrinka Blazing to keep working on this / monitoring your progress     03/11/2017  f/u ov/Wert re: new sob worse talking but also walking  Chief Complaint  Patient presents with  . Acute Visit    Pt c/o increased DOE since Feb 2017.  She states she gets SOB with minimal exertion such as just talking.   doe = MMRC3 = can't walk 100 yards even at a slow pace at a flat grade s stopping due to sob Acutely with flu in Feb 2018  cough and sob "worst ever " but cough resolved completely and breathing never improved  On cpap at night and does fine s disturbance due to sob  No better p saba / sob even just sitting talking    No obvious day to day or daytime variability or assoc excess/ purulent sputum or mucus plugs or hemoptysis or cp or chest tightness, subjective wheeze or overt sinus or hb symptoms. No unusual exp hx or h/o childhood pna/ asthma or knowledge of premature birth.  Sleeping ok without nocturnal  or early am exacerbation  of respiratory  c/o's or need for noct saba. Also denies any obvious fluctuation of symptoms with weather or environmental changes or other aggravating or alleviating factors except as outlined above  Current Medications, Allergies, Complete Past Medical History, Past Surgical History, Family History, and Social History were reviewed in [facility] electronic medical record.  ROS  The following are not active complaints unless bolded sore throat, dysphagia, dental problems, itching, sneezing,  nasal congestion or excess/ purulent secretions, ear ache,   fever, chills, sweats, unintended wt loss, classically pleuritic or exertional cp,  orthopnea pnd or leg swelling, presyncope, palpitations, abdominal pain, anorexia, nausea, vomiting, diarrhea  or change in bowel or bladder habits, change in stools or urine, dysuria,hematuria,  rash, arthralgias, visual complaints, headache, numbness, weakness or ataxia or problems with walking or  coordination,  change in mood/affect or memory.                 Objective:   Physical Exam  amb obese bf nad   03/11/2017       279  04/30/2015       275      03/18/15 272 lb (123.378 kg)  05/24/14 275 lb 6.4 oz (124.921 kg)  02/20/10 264 lb (119.75 kg)    Vital signs reviewed - Note on arrival 02 sats  98% on RA      HEENT: nl dentition, turbinates, and orophanx. Nl external ear canals without cough reflex   NECK :  without JVD/Nodes/TM/ nl carotid upstrokes bilaterally   LUNGS: no acc muscle use, clear to A and P bilaterally without cough on insp or exp maneuvers   CV:  RRR  no s3 or murmur or increase in P2, no edema   ABD:  soft and nontender with nl excursion in the supine position. No bruits or organomegaly, bowel sounds nl  MS:  warm without deformities, calf tenderness, cyanosis or clubbing  SKIN: warm and dry without lesions    NEURO:  alert, approp, no deficits   CXR PA and Lateral:   03/11/2017 :    I personally reviewed images and agree with radiology impression as follows:   Chronic hilar adenopathy, consistent with history of sarcoidosis. No new evident adenopathy by radiography. Minimal bibasilar lung scarring. Lungs otherwise clear. Stable cardiac silhouette.     Labs ordered/ reviewed:      Chemistry      Component Value Date/Time   NA 139 03/11/2017 1656   K 4.0 03/11/2017 1656   CL 102 03/11/2017 1656   CO2 26 03/11/2017 1656   BUN 19 03/11/2017 1656   CREATININE 1.36 (H) 03/11/2017 1656      Component Value Date/Time   CALCIUM 9.5 03/11/2017 1656   ALKPHOS 59 08/23/2014 1450   AST 20 08/23/2014 1450   ALT 19 08/23/2014 1450   BILITOT 0.4 08/23/2014 1450        Lab Results  Component Value Date   WBC 6.8 03/11/2017   HGB 12.9 03/11/2017   HCT 38.3 03/11/2017   MCV 89.2 03/11/2017   PLT 301.0 03/11/2017     Lab Results  Component Value Date   DDIMER 0.86 (H) 03/11/2017      Lab Results  Component Value Date   TSH  2.84 03/11/2017     Lab Results  Component Value Date   PROBNP 164.0 (H) 03/11/2017             Assessment & Plan:

## 2017-03-12 ENCOUNTER — Telehealth: Payer: Self-pay | Admitting: Internal Medicine

## 2017-03-12 LAB — RESPIRATORY ALLERGY PROFILE REGION II ~~LOC~~
Allergen, A. alternata, m6: <0.1 kU/L
Allergen, C. Herbarum, M2: <0.1 kU/L
Allergen, Cedar tree, t12: <0.1 kU/L
Allergen, Comm Silver Birch, t9: <0.1 kU/L
Allergen, Cottonwood, t14: <0.1 kU/L
Allergen, D pternoyssinus,d7: <0.1 kU/L
Allergen, Mouse Urine Protein, e78: <0.1 kU/L
Allergen, Mulberry, t76: <0.1 kU/L
Allergen, Oak,t7: <0.1 kU/L
Allergen, P. notatum, m1: <0.1 kU/L
Aspergillus fumigatus, m3: <0.1 kU/L
Cat Dander: <0.1 kU/L
Common Ragweed: <0.1 kU/L
IgE (Immunoglobulin E), Serum: 29 kU/L (ref <115)
Pecan/Hickory Tree IgE: <0.1 kU/L
Rough Pigweed  IgE: <0.1 kU/L
Timothy Grass: <0.1 kU/L

## 2017-03-12 LAB — D-DIMER, QUANTITATIVE: D-Dimer, Quant: 0.86 mcg/mL FEU — ABNORMAL HIGH (ref <0.50)

## 2017-03-12 LAB — BASIC METABOLIC PANEL
BUN: 19 mg/dL (ref 6–23)
CO2: 26 mEq/L (ref 19–32)
Calcium: 9.5 mg/dL (ref 8.4–10.5)
Chloride: 102 mEq/L (ref 96–112)
Creatinine, Ser: 1.36 mg/dL — ABNORMAL HIGH (ref 0.40–1.20)
GFR: 49.74 mL/min — AB (ref >60.00)
Glucose, Bld: 124 mg/dL — ABNORMAL HIGH (ref 70–99)
POTASSIUM: 4 meq/L (ref 3.5–5.1)
SODIUM: 139 meq/L (ref 135–145)

## 2017-03-12 NOTE — Telephone Encounter (Signed)
Pt already aware of results  Nothing further needed 

## 2017-03-12 NOTE — Progress Notes (Signed)
Spoke with pt and notified of results per Dr. Wert. Pt verbalized understanding and denied any questions. 

## 2017-03-12 NOTE — Progress Notes (Signed)
LMTCB

## 2017-03-14 NOTE — Assessment & Plan Note (Signed)
Body mass index is 47.89 kg/m.  -  trending up Lab Results  Component Value Date   TSH 2.84 03/11/2017     Contributing to gerd risk/ doe/reviewed the need and the process to achieve and maintain neg calorie balance > defer f/u primary care including intermittently monitoring thyroid status

## 2017-03-14 NOTE — Assessment & Plan Note (Signed)
No evidence at all of recurrence

## 2017-03-14 NOTE — Assessment & Plan Note (Addendum)
-   03/18/2015   Walked RA x 3 lap @ 14585ft each/ brisk pace / tol well s desats - PFTs 04/30/2015    VC 2.32 (93%) no obst dlco 58% corrects 80%   - 03/11/2017  Walked RA x 3 laps @ 185 ft each stopped due to  End of study, nl pace, no   desat - min  sob   Symptoms are markedly disproportionate to objective findings (resting sob talking but not reproducible walking )  and not clear this is actually much of a  lung problem but pt does appear to have difficult to sort out respiratory symptoms of unknown origin for which  DDX  = almost all start with A and  include Adherence, Ace Inhibitors, Acid Reflux, Active Sinus Disease, Alpha 1 Antitripsin deficiency, Anxiety masquerading as Airways dz,  ABPA,  Allergy(esp in young), Aspiration (esp in elderly), Adverse effects of meds,  Active smokers, A bunch of PE's (a small clot burden can't cause this syndrome unless there is already severe underlying pulm or vascular dz with poor reserve) plus two Bs  = Bronchiectasis and Beta blocker use..and one C= CHF     Adherence is always the initial "prime suspect" and is a multilayered concern that requires a "trust but verify" approach in every patient - starting with knowing how to use medications, especially inhalers, correctly, keeping up with refills and understanding the fundamental difference between maintenance and prns vs those medications only taken for a very short course and then stopped and not refilled.   ? Acid (or non-acid) GERD > always difficult to exclude as up to 75% of pts in some series report no assoc GI/ Heartburn symptoms> rec max (24h)  acid suppression and diet restrictions/ reviewed and instructions given in writing.   ? Allergy/ asthma > no cough or noct symptoms so seems unlikely   ? A bunch of PE's > D dimer upper limits of nl for age  - while  This   may miss small peripheral pe, the clot burden with sob is moderately high and the d dimer has a very high neg pred value in this setting    ?  Adverse effects of meds > no usual suspects  ? BB effects > very unlikely on  bystolic   ? CHF > unlikely though not obesity does tend to artificially suppress the bnp and hers is > 100 so consider echo if not improving    I had an extended discussion with the patient reviewing all relevant studies completed to date and  lasting 25 minutes of a 40  minute acute visit to re-establish with me   re  severe non-specific but potentially very serious refractory respiratory symptoms of uncertain and potentially multiple  etiologies.   Each maintenance medication was reviewed in detail including most importantly the difference between maintenance and prns and under what circumstances the prns are to be triggered using an action plan format that is not reflected in the computer generated alphabetically organized AVS.    Please see AVS for specific instructions unique to this office visit that I personally wrote and verbalized to the the pt in detail and then reviewed with pt  by my nurse highlighting any changes in therapy/plan of care  recommended at today's visit.

## 2017-03-26 ENCOUNTER — Telehealth: Payer: Self-pay | Admitting: Cardiology

## 2017-03-26 NOTE — Telephone Encounter (Signed)
Pt calling about the EKG [facility] had at [facility] in pre[facility]op.  Explained PVC's and what they are.  Advised sometimes they can be caused by stimulants in her diet.  She did state she noticed them more after drinking coffee.  She is going to cut back on it to see if it makes a difference. She will c/b with further questions or concerns.  Sinus rhythm with frequent premature ventricular complexes  Biatrial enlargement  Nonspecific ST depression  Abnormal ECG    When compared with ECG of 10-Jul-2010 17:30,  Multiple ventricular premature complexes are now present  Biatrial enlargement is now present  I reviewed and concur with this report. Electronically signed UJ:[email or web address]:FREEDMAN, MD, NEIL ([id number]) on 03/20/2017 9:22:44 AM

## 2017-03-26 NOTE — Telephone Encounter (Signed)
New message    Pt is calling asking that RN call, it's about one of her test. Please call.

## 2017-04-09 ENCOUNTER — Ambulatory Visit: Payer: 59 | Admitting: Internal Medicine

## 2017-05-10 ENCOUNTER — Ambulatory Visit: Payer: 59 | Admitting: Internal Medicine

## 2017-08-27 ENCOUNTER — Other Ambulatory Visit: Payer: Self-pay | Admitting: Obstetrics and Gynecology

## 2017-08-27 DIAGNOSIS — Z1231 Encounter for screening mammogram for malignant neoplasm of breast: Secondary | ICD-10-CM

## 2017-10-06 ENCOUNTER — Ambulatory Visit
Admission: RE | Admit: 2017-10-06 | Discharge: 2017-10-06 | Disposition: A | Discharge status: Home / Self Care | Payer: 59 | Source: Ambulatory Visit | Attending: Obstetrics and Gynecology | Admitting: Obstetrics and Gynecology

## 2017-10-06 DIAGNOSIS — Z1231 Encounter for screening mammogram for malignant neoplasm of breast: Secondary | ICD-10-CM

## 2017-10-07 ENCOUNTER — Ambulatory Visit: Payer: 59

## 2017-11-19 ENCOUNTER — Ambulatory Visit (INDEPENDENT_AMBULATORY_CARE_PROVIDER_SITE_OTHER): Payer: 59 | Admitting: Internal Medicine

## 2017-11-19 ENCOUNTER — Encounter: Payer: Self-pay | Admitting: Internal Medicine

## 2017-11-19 VITALS — BP 148/84 | HR 74 | Ht 64.0 in | Wt 285.0 lb

## 2017-11-19 DIAGNOSIS — R0609 Other forms of dyspnea: Secondary | ICD-10-CM

## 2017-11-19 DIAGNOSIS — D869 Sarcoidosis, unspecified: Secondary | ICD-10-CM

## 2017-11-19 LAB — PULMONARY FUNCTION TEST
DL/VA % PRED: 85 %
DL/VA: 4.12 ml/min/mmHg/L
DLCO unc % pred: 58 %
DLCO unc: 14.23 ml/min/mmHg
FEF 25-75 POST: 1.64 L/s
FEF 25-75 Pre: 1.3 L/sec
FEF2575-%CHANGE-POST: 26 %
FEF2575-%PRED-PRE: 74 %
FEF2575-%Pred-Post: 94 %
FEV1-%CHANGE-POST: 6 %
FEV1-%PRED-PRE: 70 %
FEV1-%Pred-Post: 75 %
FEV1-PRE: 1.33 L
FEV1-Post: 1.42 L
FEV1FVC-%CHANGE-POST: 3 %
FEV1FVC-%PRED-PRE: 105 %
FEV6-%Change-Post: 3 %
FEV6-%PRED-PRE: 69 %
FEV6-%Pred-Post: 72 %
FEV6-POST: 1.67 L
FEV6-Pre: 1.62 L
FEV6FVC-%Change-Post: 0 %
FEV6FVC-%PRED-POST: 104 %
FEV6FVC-%Pred-Pre: 104 %
FVC-%CHANGE-POST: 3 %
FVC-%PRED-PRE: 67 %
FVC-%Pred-Post: 69 %
FVC-POST: 1.67 L
FVC-PRE: 1.62 L
POST FEV6/FVC RATIO: 100 %
PRE FEV6/FVC RATIO: 100 %
Post FEV1/FVC ratio: 85 %
Pre FEV1/FVC ratio: 82 %
RV % pred: 207 %
RV: 4.47 L
TLC % pred: 126 %
TLC: 6.39 L

## 2017-11-19 MED ORDER — ALBUTEROL SULFATE HFA 108 (90 BASE) MCG/ACT IN AERS: 2.0000 | INHALATION_SPRAY | RESPIRATORY_TRACT | 1 refills | Status: DC | PRN

## 2017-11-19 NOTE — Patient Instructions (Addendum)
Try using albuterol either  5 min before strenuous exercise or half way thru to see if it makes a difference   Work on inhaler technique:  relax and gently blow all the way out then take a nice smooth deep breath back in, triggering the inhaler at same time you start breathing in.  Hold for up to 5 seconds if you can.  . Rinse and gargle with water when done      If you are satisfied with your treatment plan,  let your doctor know and he/she can either refill your medications or you can return here when your prescription runs out.     If in any way you are not 100% satisfied,  please tell us.  If 100% better, tell your friends!  Pulmonary follow up is as needed

## 2017-11-19 NOTE — Progress Notes (Signed)
Subjective:    Patient ID: Wendy Downs, female    DOB: 1951-02-10    MRN: [id number]   Brief patient profile:  43 yobf never smoker remotely followed by Dr Kriste Basque / clance for sarcoid and referred back to pulmonary clinic 03/18/2015 by Dr Merri Brunette for eval of sob ? Sarcoid active last pred early 90s    History of Present Illness  03/18/2015 1st  office visit/ Wendy Downs   Chief Complaint  Patient presents with  . Advice Only    Old [facility] pt here to re-establish for sarcoidosis.    last seen 5 years prior to OV   and not on any  prednisone since even before then, main manifestation was ocular > regular f/u for glaucoma/ [facility]  New problem indolent onset progressive/ persistent daily doe x May 2016 indolent onset with exertion initially now sob x across room and also sitting still, hear's wheezing at hs and has had episodes of cough to point of choking p smelling grilled food 03/17/15 assoc with sense of nasal and chest congestion but cough dry/ no purulent secretions/ worse day than noct  rec Ok to try tylenol cold and sinus and call us if not better  Please remember to go to the  Lab and x-ray department downstairs for your tests - we will call you with the results when they are available. Try prilosec 20mg   Take 30-60 min before first meal of the day and Pepcid (famotidine) 20 mg one bedtime until cough is completely gone for at least a week without the need for cough suppression GERD diet    04/30/2015 f/u ov/Wendy Downs re: doe ? All Obesity  Chief Complaint  Patient presents with  . Follow-up    DOE has gotten better since she started taking Zyrtec. PFT done today.  Not limited by breathing from desired activities  But not very active  Only took gerd rx x one week then abd cramps and stopped both ppi and h2 and the cramps resolved  rec Your lung function is excellent so the main challenge long term keeping you healthy is getting your weight back under control by getting yourself into a  negative calorie balance and following  up with Dr Katrinka Blazing to keep working on this / monitoring your progress     03/11/2017  f/u ov/Wendy Downs re: new sob worse talking but also walking  Chief Complaint  Patient presents with  . Acute Visit    Pt c/o increased DOE since Feb 2017.  She states she gets SOB with minimal exertion such as just talking.   doe = MMRC3 = can't walk 100 yards even at a slow pace at a flat grade s stopping due to sob Acutely with flu in Feb 2018  cough and sob "worst ever " but cough resolved completely and breathing never improved  On cpap at night and does fine s disturbance due to sob  No better p saba / sob even just sitting talking  rec Return for pfts    11/19/2017  f/u ov/Wendy Downs re: doe in setting of prev dx sarcoid  Chief Complaint  Patient presents with  . Follow-up    PFT's done today. She states her breathing is unchanged. She rarely uses her albuterol bc she recovers quickly. She has noticed some wheezing over the past wk.   Dyspnea:   MMRC3 = can't walk 100 yards even at a slow pace at a flat grade s stopping due to sob   Cough:  mild, sporadic assoc with pnds esp in am p am routine/ never wakes her  Sleep: 2 pillows/ no 02  On cpap   No obvious day to day or daytime variability or assoc excess/ purulent sputum or mucus plugs or hemoptysis or cp or chest tightness,  overt sinus or hb symptoms. No unusual exposure hx or h/o childhood pna/ asthma or knowledge of premature birth.  Sleeping ok on cpap   without nocturnal  or early am exacerbation  of respiratory  c/o's or need for noct saba. Also denies any obvious fluctuation of symptoms with weather or environmental changes or other aggravating or alleviating factors except as outlined above   Current Allergies, Complete Past Medical History, Past Surgical History, Family History, and Social History were reviewed in [facility] electronic medical record.  ROS  The following are not active complaints unless  bolded Hoarseness, sore throat, dysphagia, dental problems, itching, sneezing,  nasal congestion or discharge of excess mucus or purulent secretions, ear ache,   fever, chills, sweats, unintended wt loss or wt gain, classically pleuritic or exertional cp,  orthopnea pnd or leg swelling, presyncope, palpitations, abdominal pain, anorexia, nausea, vomiting, diarrhea  or change in bowel habits or change in bladder habits, change in stools or change in urine, dysuria, hematuria,  rash, arthralgias, visual complaints- poor viz/L eye chronic, headache, numbness, weakness or ataxia or problems with walking or coordination,  change in mood/affect or memory.        Current Meds  Medication Sig  . albuterol (PROAIR HFA) 108 (90 Base) MCG/ACT inhaler Inhale 2 puffs into the lungs every 4 (four) hours as needed.  Marland Kitchen. allopurinol (ZYLOPRIM) 100 MG tablet Take 100 mg by mouth 2 (two) times daily.  Marland Kitchen. amLODipine (NORVASC) 2.5 MG tablet Take 2.5 mg by mouth daily.  . B Complex Vitamins (VITAMIN B COMPLEX IJ) Inject 1 mL as directed every 30 (thirty) days. Vitamin b12 injection  . brimonidine (ALPHAGAN P) 0.1 % SOLN Place 1 drop into both eyes 2 (two) times daily.   Marland Kitchen. BYSTOLIC 20 MG TABS TAKE 1 TABLET (20 MG TOTAL) BY MOUTH DAILY.  . cetirizine (ZYRTEC) 10 MG tablet Take 10 mg by mouth daily as needed for allergies.  . furosemide (LASIX) 20 MG tablet Take 40 mg by mouth daily.   . indomethacin (INDOCIN) 50 MG capsule Take 50 mg by mouth 2 (two) times daily as needed.   . latanoprost (XALATAN) 0.005 % ophthalmic solution Place 1 drop into the right eye at bedtime.  . Multiple Vitamins-Minerals (MEGA MULTI WOMEN PO) Take by mouth.  . olmesartan (BENICAR) 40 MG tablet Take 40 mg by mouth daily.  . Omega-3 1000 MG CAPS Take 1 capsule by mouth daily.  . timolol (BETIMOL) 0.25 % ophthalmic solution Place 2 drops into both eyes 2 (two) times daily.  . [DISCONTINUED] albuterol (PROAIR HFA) 108 (90 Base) MCG/ACT inhaler  Inhale 2 puffs into the lungs every 4 (four) hours as needed.             Objective:   Physical Exam  amb obese bf nad    11/19/2017         285  03/11/2017       279  04/30/2015       275      03/18/15 272 lb (123.378 kg)  05/24/14 275 lb 6.4 oz (124.921 kg)  02/20/10 264 lb (119.75 kg)     Vital signs reviewed - Note on arrival 02 sats  98% on RA     HEENT: nl dentition, turbinates bilaterally, and oropharynx. Nl external ear canals without cough reflex   NECK :  without JVD/Nodes/TM/ nl carotid upstrokes bilaterally   LUNGS: no acc muscle use,  Nl contour chest which is clear to A and P bilaterally without cough on insp or exp maneuvers   CV:  RRR  no s3 or murmur or increase in P2, and  Trace bilateral sym pedal edema   ABD:  Quit obese/ nontender with limited  inspiratory excursion in the supine position. No bruits or organomegaly appreciated, bowel sounds nl  MS:  Nl gait/ ext warm without deformities, calf tenderness, cyanosis or clubbing No obvious joint restrictions   SKIN: warm and dry without lesions    NEURO:  alert, approp, nl sensorium with  no motor or cerebellar deficits apparent.          Assessment & Plan:

## 2017-11-19 NOTE — Progress Notes (Signed)
PFT completed today.  

## 2017-11-20 ENCOUNTER — Encounter: Payer: Self-pay | Admitting: Internal Medicine

## 2017-11-20 NOTE — Assessment & Plan Note (Addendum)
-   PFT's  04/30/15   FEV1 1.67 (85 % ) ratio 85  p 0 % improvement from saba p ? prior to study with DLCO  58 % corrects to 80 % for alv volume  And ERV  = 92% at wt 275  -PFT's  11/19/2017  FEV1 1.42 (75 % ) ratio 85  p 6 % improvement from saba p nothing prior to study with DLCO  58 % corrects to 85 % for alv volume  And ERV 68% at wt 285    - The proper method of use, as well as anticipated side effects, of a metered-dose inhaler are discussed and demonstrated to the patient.    I had an extended final summary discussion with the patient reviewing all relevant studies completed to date and  lasting 15 to 20 minutes of a 25 minute visit on the following issues:   She has lost lung volume but not dlco with lower ERV typical of the effects of wt gain in the abdominal compartment ie obesity which appears to be the main issue limiting exertion  She could have some mild residual airways dysfunction and may benefit from prn saba perhaps just prior to ex but no need for now for more aggressive rx or pulmonary f/u  Which is prn    Each maintenance medication was reviewed in detail including most importantly the difference between maintenance and as needed and under what circumstances the prns are to be used.  Please see AVS for specific  Instructions which are unique to this visit and I personally typed out  which were reviewed in detail in writing with the patient and a copy provided.

## 2017-11-20 NOTE — Assessment & Plan Note (Signed)
Complicated by hbp and mild restrictive changes on PFTs 11/19/2017 with erv 68%   Body mass index is 48.92 kg/m.  -  trending up/ noted Lab Results  Component Value Date   TSH 2.84 03/11/2017     Contributing to gerd risk/ doe/reviewed the need and the process to achieve and maintain neg calorie balance > defer f/u primary care including intermittently monitoring thyroid status

## 2017-12-31 ENCOUNTER — Telehealth: Payer: Self-pay | Admitting: Cardiology

## 2017-12-31 NOTE — Telephone Encounter (Signed)
° °  [facility] Pre-operative Risk Assessment    Request for surgical clearance:  1. What type of surgery is being performed?  2. When is this surgery scheduled? 01-17-18  3. What type of clearance is required (medical clearance vs. Pharmacy clearance to hold med vs. Both)? Both   4. Are there any medications that need to be held prior to surgery and how long? N/a   5. Practice name and name of physician performing surgery? [facility]// Dr Dustin Flock   6. What is your office phone and fax number?   7. Anesthesia type (None, local, MAC, general) ?    Wendy Downs 12/31/2017, 4:05 PM  _________________________________________________________________   (provider comments below)

## 2017-12-31 NOTE — Telephone Encounter (Signed)
close

## 2018-01-03 ENCOUNTER — Telehealth: Payer: Self-pay | Admitting: Cardiology

## 2018-01-03 NOTE — Telephone Encounter (Signed)
New Message  Pt c/o Shortness Of Breath: STAT if SOB developed within the last 24 hours or pt is noticeably SOB on the phone  1. Are you currently SOB (can you hear that pt is SOB on the phone)? no  2. How long have you been experiencing SOB? Few months  3. Are you SOB when sitting or when up moving around? Moving around outside  4. Are you currently experiencing any other symptoms? 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

## 2018-01-03 NOTE — Telephone Encounter (Signed)
   Primary Cardiologist:Mark Anne FuSkains, MD  Chart reviewed as part of pre-operative protocol coverage. Because of [name]'s past medical history and time since last visit, he/she will require a follow-up visit in order to better assess preoperative cardiovascular risk.  Pre-op covering staff: - Please schedule appointment and call patient to inform them. - Please contact requesting surgeon's office via preferred method (i.e, phone, fax) to inform them of need for appointment prior to surgery.   Roe RutherfordAngela Nicole [facility], PA  01/03/2018, 2:09 PM

## 2018-01-03 NOTE — Progress Notes (Signed)
Cardiology Office Note    Date:  01/04/2018   ID:  Wendy Downs, DOB 1951/06/14, MRN [id number]  PCP:  Merri Brunette, MD  Cardiologist:  Dr. Anne Fu   Chief Complaint: SOB, heart flutters and surgical clearance   History of Present Illness:   Wendy Downs is a 67 y.o. female  with hx of difficult to control hypertension, morbid obesity, OSA on CPAP and hyperlipidemia  presents for surgical clearance.   Has never smoked in her life. She has battled with hypertension for several years, since she was 43. Prior secondary workup was unremarkable including renal duplex Dopplers, normal.   Previous medications tried for hypertension include spironolactone however she developed dehydration from over diuresis. She has also tried higher dose amlodipine but developed worsening edema.  Had gout on HCTZ. Had side effect on clonidine.   Last seen by Dr. Anne Fu 02/11/2017. Her dyspnea felt multifactorial, mostly due to obesity. Complained of irregular heart rate but did not warrants monitor at time.   Follow up echo 02/2017 showed normal LVEF of 60-65%, grade 1 DD.   Patient is schedule for L cornea transplant on 01/17/18 @ [facility].  She has a chronic dyspnea with exertion.  Her dyspnea mostly occurs when she is outside of home.  Seems allergy is also contributing to this.  She goes up and down of stairs and walks inside out without any dyspnea.  She is also complains of intermittent fluttering sensation without associated symptoms.  It occurs once or twice a month and self resolves within 5 seconds.  This has been stable since last office visit.  Patient's edema has improved since reducing amlodipine further to 2.5 mg daily.  She has intermittent orthopnea.  She takes Lasix 20 mg daily.  Denies PND or syncope.  Past Medical History:  Diagnosis Date  . Diabetes mellitus without complication (HCC)   . Gout   . Hyperlipidemia   . Hypertension   . Morbid obesity (HCC)   . Shingles     Past  Surgical History:  Procedure Laterality Date  . APPENDECTOMY    . CARPAL TUNNEL RELEASE    . CHOLECYSTECTOMY      Current Medications: Prior to Admission medications   Medication Sig Start Date End Date Taking? Authorizing Provider  albuterol (PROAIR HFA) 108 (90 Base) MCG/ACT inhaler Inhale 2 puffs into the lungs every 4 (four) hours as needed. 11/19/17   Nyoka Cowden, MD  allopurinol (ZYLOPRIM) 100 MG tablet Take 100 mg by mouth 2 (two) times daily.    [provider]  amLODipine (NORVASC) 2.5 MG tablet Take 2.5 mg by mouth daily.    [provider]  B Complex Vitamins (VITAMIN B COMPLEX IJ) Inject 1 mL as directed every 30 (thirty) days. Vitamin b12 injection    [provider]  brimonidine (ALPHAGAN P) 0.1 % SOLN Place 1 drop into both eyes 2 (two) times daily.     [provider]  BYSTOLIC 20 MG TABS TAKE 1 TABLET (20 MG TOTAL) BY MOUTH DAILY. 01/27/17   Jake Bathe, MD  cetirizine (ZYRTEC) 10 MG tablet Take 10 mg by mouth daily as needed for allergies.    [provider]  furosemide (LASIX) 20 MG tablet Take 40 mg by mouth daily.     [provider]  indomethacin (INDOCIN) 50 MG capsule Take 50 mg by mouth 2 (two) times daily as needed.     [provider]  latanoprost (XALATAN) 0.005 %  ophthalmic solution Place 1 drop into the right eye at bedtime.    [provider]  Multiple Vitamins-Minerals (MEGA MULTI WOMEN PO) Take by mouth.    [provider]  olmesartan (BENICAR) 40 MG tablet Take 40 mg by mouth daily.    [provider]  Omega-3 1000 MG CAPS Take 1 capsule by mouth daily.    [provider]  timolol (BETIMOL) 0.25 % ophthalmic solution Place 2 drops into both eyes 2 (two) times daily.    [provider]    Allergies:   Clonidine derivatives; Prednisone; Valtrex [valacyclovir hcl]; and Vaseretic [enalapril-hydrochlorothiazide]   Social History   Socioeconomic  History  . Marital status: Divorced    Spouse name: Not on file  . Number of children: Not on file  . Years of education: Not on file  . Highest education level: Not on file  Occupational History  . Not on file  Social Needs  . Financial resource strain: Not on file  . Food insecurity:    Worry: Not on file    Inability: Not on file  . Transportation needs:    Medical: Not on file    Non-medical: Not on file  Tobacco Use  . Smoking status: Never Smoker  . Smokeless tobacco: Never Used  Substance and Sexual Activity  . Alcohol use: No    Alcohol/week: 0.0 oz  . Drug use: No  . Sexual activity: Not on file  Lifestyle  . Physical activity:    Days per week: Not on file    Minutes per session: Not on file  . Stress: Not on file  Relationships  . Social connections:    Talks on phone: Not on file    Gets together: Not on file    Attends religious service: Not on file    Active member of club or organization: Not on file    Attends meetings of clubs or organizations: Not on file    Relationship status: Not on file  Other Topics Concern  . Not on file  Social History Narrative  . Not on file     Family History:  The patient's family history includes Aneurysm in her brother; Heart attack in her mother; Heart disease in her brother and sister; Hypertension in her mother; Stroke in her brother, father, sister, and sister.   ROS:   Please see the history of present illness.    ROS All other systems reviewed and are negative.   PHYSICAL EXAM:   VS:  BP (!) 160/90   Pulse 71   Ht 5\' 4"  (1.626 m)   Wt 289 lb 12.8 oz (131.5 kg)   SpO2 99%   BMI 49.74 kg/m    GEN: Morbidly obese female in no acute distress  HEENT: normal  Neck: no JVD, carotid bruits, or masses Cardiac: RRR; no murmurs, rubs, or gallops, trace edema  Respiratory:  clear to auscultation bilaterally, normal work of breathing GI: soft, nontender, nondistended, + BS MS: no deformity or atrophy  Skin: warm  and dry, no rash Neuro:  Alert and Oriented x 3, Strength and sensation are intact Psych: euthymic mood, full affect  Wt Readings from Last 3 Encounters:  01/04/18 289 lb 12.8 oz (131.5 kg)  11/19/17 285 lb (129.3 kg)  03/11/17 279 lb (126.6 kg)      Studies/Labs Reviewed:   EKG:  EKG is ordered today.  The ekg ordered today demonstrates normal sinus rhythm with bilateral atrial enlargement Recent Labs:  03/11/2017: BUN 19; Creatinine, Ser 1.36; Hemoglobin 12.9; Platelets 301.0; Potassium 4.0; Pro B Natriuretic peptide (BNP) 164.0; Sodium 139; TSH 2.84   Lipid Panel No results found for: CHOL, TRIG, HDL, CHOLHDL, VLDL, LDLCALC, LDLDIRECT  Additional studies/ records that were reviewed today include:   As above   ASSESSMENT & PLAN:    1. Dyspnea on exertion This is chronic.  Multifactorial due to obesity and deconditioning.  This has been stable for months.  Also exacerbated by allergies.  She does well when walks inside the house and climb a stairs.  She has intermittent orthopnea and edema edema has improved since reducing.  Amlodipine further to 2.5 mg daily. -Will check BNP and edema.  2.  Palpitation  - This occurs once or twice a month with self resolution in 5 seconds.  No associated symptoms of dizziness, shortness of breath, chest pain or syncope. -This been stable since last office visit.  This did not warrant monitor currently, also she will require 30-day event monitor if required. -Continue beta-blocker.  3.  Preoperative clearance -Chronic dyspnea on exertion and palpitation.  Stable for months.  Check electrolytes.  Prior TSH normal.  She did not have time for 30-day event monitor as her left cornea surgery schedule in 2 weeks.  She will be at risk for any arrhythmias and patient understands this.  She is still able to achieve 5.67 METs of activity per [facility] activity status index.    Medication Adjustments/Labs and Tests Ordered: Current medicines are reviewed at  length with the patient today.  Concerns regarding medicines are outlined above.  Medication changes, Labs and Tests ordered today are listed in the Patient Instructions below. Patient Instructions  Medication Instructions:  The current medical regimen is effective;  continue present plan and medications.  Labwork: Please have blood work today. (BMP, Pro-BNP)  Follow-Up: Follow up with Dr Anne FuSkains as scheduled.  If you need a refill on your cardiac medications before your next appointment, please call your pharmacy.  Thank you for choosing [facility]!!        Lorelei PontSigned, Aayushi Solorzano, [place]  01/04/2018 10:18 AM    [facility] [address], [place], [place]  [address] Phone: [phone]) [phone]; Fax: [phone]

## 2018-01-03 NOTE — Telephone Encounter (Signed)
Spoke with patient who is c/o SOB off and on.  It usually occurs when she is outside walking around.  She has also had "heart flutters" every once in awhile.  She reports this occurs maybe once a week, are sporadic and usually only lasts a few seconds.  Mainly she is calling because she is scheduled to have a 4 hour long surgery [facility] th and is wanting to know she OK to undergo this procedure.  Scheduled appt for 8:30 am for evaluation.  Patient was grateful for the call.

## 2018-01-04 ENCOUNTER — Encounter: Payer: Self-pay | Admitting: Physician Assistant

## 2018-01-04 ENCOUNTER — Ambulatory Visit (INDEPENDENT_AMBULATORY_CARE_PROVIDER_SITE_OTHER): Payer: 59 | Admitting: Physician Assistant

## 2018-01-04 VITALS — BP 160/90 | HR 71 | Ht 64.0 in | Wt 289.8 lb

## 2018-01-04 DIAGNOSIS — R0600 Dyspnea, unspecified: Secondary | ICD-10-CM | POA: Diagnosis not present

## 2018-01-04 DIAGNOSIS — R002 Palpitations: Secondary | ICD-10-CM

## 2018-01-04 DIAGNOSIS — R0602 Shortness of breath: Secondary | ICD-10-CM

## 2018-01-04 DIAGNOSIS — R0609 Other forms of dyspnea: Secondary | ICD-10-CM

## 2018-01-04 DIAGNOSIS — I10 Essential (primary) hypertension: Secondary | ICD-10-CM

## 2018-01-04 DIAGNOSIS — Z01810 Encounter for preprocedural cardiovascular examination: Secondary | ICD-10-CM

## 2018-01-04 LAB — BASIC METABOLIC PANEL
BUN/Creatinine Ratio: 12 (ref 12–28)
BUN: 14 mg/dL (ref 8–27)
CALCIUM: 9.1 mg/dL (ref 8.7–10.3)
CO2: 25 mmol/L (ref 20–29)
CREATININE: 1.18 mg/dL — AB (ref 0.57–1.00)
Chloride: 98 mmol/L (ref 96–106)
GFR, EST AFRICAN AMERICAN: 55 mL/min/{1.73_m2} — AB (ref >59)
GFR, EST NON AFRICAN AMERICAN: 48 mL/min/{1.73_m2} — AB (ref >59)
Glucose: 171 mg/dL — ABNORMAL HIGH (ref 65–99)
POTASSIUM: 4.5 mmol/L (ref 3.5–5.2)
Sodium: 138 mmol/L (ref 134–144)

## 2018-01-04 LAB — PRO B NATRIURETIC PEPTIDE: NT-Pro BNP: 195 pg/mL (ref 0–301)

## 2018-01-04 NOTE — Patient Instructions (Signed)
Medication Instructions:  The current medical regimen is effective;  continue present plan and medications.  Labwork: Please have blood work today. (BMP, Pro-BNP)  Follow-Up: Follow up with Dr Anne FuSkains as scheduled.  If you need a refill on your cardiac medications before your next appointment, please call your pharmacy.  Thank you for choosing [facility]!!

## 2018-01-05 NOTE — Progress Notes (Signed)
Pt has been made aware of normal result and verbalized understanding.  jw 01/05/18

## 2018-01-27 ENCOUNTER — Other Ambulatory Visit: Payer: Self-pay | Admitting: Internal Medicine

## 2018-03-17 ENCOUNTER — Ambulatory Visit (INDEPENDENT_AMBULATORY_CARE_PROVIDER_SITE_OTHER): Payer: 59 | Admitting: Cardiology

## 2018-03-17 ENCOUNTER — Encounter: Payer: Self-pay | Admitting: Cardiology

## 2018-03-17 DIAGNOSIS — I10 Essential (primary) hypertension: Secondary | ICD-10-CM

## 2018-03-17 DIAGNOSIS — R0609 Other forms of dyspnea: Secondary | ICD-10-CM

## 2018-03-17 NOTE — Patient Instructions (Signed)

## 2018-03-17 NOTE — Progress Notes (Signed)
[address] [place], [place]  [address] Phone: [phone](336) [phone] Fax:  [phone]  Date:  03/17/2018   ID:  Wendy Lagosatricia A Crichlow, DOB [birth year], MRN [id number]  PCP:  Merri BrunetteSmith, Candace, MD   History of Present Illness: Wendy Downs is a 67 y.o. female with hx of difficult to control hypertension, morbid obesity, and hyperlipidemia here for evaluation of irregular heartbeat noticed by her OB/GYN. She herself, the patient, did not notice necessarily any irregularity. She has shortness of breath that has been chronic. No chest pain, no syncope. No orthopnea.  Has never smoked in her life. She has battled with hypertension for several years, since she was [age]. Prior secondary workup was unremarkable including renal duplex Dopplers, normal.   Previous medications tried for hypertension include spironolactone however she developed dehydration from over diuresis. She has also tried higher dose amlodipine but developed worsening edema.  Had gout on HCTZ.  Saw Dr. Sherene SiresWert 04/30/15 - no PFT restrictive evidence of sarcoid. Obesity is primary problem, no need for pulm fu.   A1c-6.9 -weight watchers, 8 pounds loss. Migraines - triggered by lights. Feeling well otherwise. Mild edema.  03/17/2018-overall doing quite well.  She [facility].  No chest pain, no changes in shortness of breath, actually improved.  No fevers chills.   Wt Readings from Last 3 Encounters:  03/17/18 286 lb 1.9 oz (129.8 kg)  01/04/18 289 lb 12.8 oz (131.5 kg)  11/19/17 285 lb (129.3 kg)     Past Medical History:  Diagnosis Date  . Diabetes mellitus without complication (HCC)   . Gout   . Hyperlipidemia   . Hypertension   . Morbid obesity (HCC)   . Shingles     Past Surgical History:  Procedure Laterality Date  . APPENDECTOMY    . CARPAL TUNNEL RELEASE    . CHOLECYSTECTOMY      Current Outpatient Medications  Medication Sig Dispense Refill  . albuterol (PROVENTIL  HFA;VENTOLIN HFA) 108 (90 Base) MCG/ACT inhaler TAKE 2 PUFFS BY MOUTH EVERY 4 HOURS AS NEEDED 8.5 Inhaler 1  . allopurinol (ZYLOPRIM) 100 MG tablet Take 100 mg by mouth 2 (two) times daily.    Marland Kitchen. amLODipine (NORVASC) 2.5 MG tablet Take 2.5 mg by mouth daily.    . B Complex Vitamins (VITAMIN B COMPLEX IJ) Inject 1 mL as directed every 30 (thirty) days. Vitamin b12 injection    . brimonidine (ALPHAGAN P) 0.1 % SOLN Place 1 drop into both eyes 3 (three) times daily.     Marland Kitchen. BYSTOLIC 20 MG TABS TAKE 1 TABLET (20 MG TOTAL) BY MOUTH DAILY. 30 tablet 0  . cetirizine (ZYRTEC) 10 MG tablet Take 10 mg by mouth daily as needed for allergies.    . furosemide (LASIX) 20 MG tablet Take 40 mg by mouth daily.     . indomethacin (INDOCIN) 50 MG capsule Take 50 mg by mouth 2 (two) times daily as needed.     . latanoprost (XALATAN) 0.005 % ophthalmic solution Place 1 drop into the right eye at bedtime.     . Multiple Vitamins-Minerals (MEGA MULTI WOMEN PO) Take by mouth.    . Omega-3 1000 MG CAPS Take 1 capsule by mouth daily.    Marland Kitchen. telmisartan (MICARDIS) 80 MG tablet Take 1 tablet by mouth daily.  1  . timolol (TIMOPTIC) 0.5 % ophthalmic solution Place 2 drops into both eyes 2 (two) times daily.  4  .  Undecylenic Ac-Zn Undecylenate (FUNGI-NAIL TOE & FOOT EX) Apply 1 application topically daily.     No current facility-administered medications for this visit.     Allergies:    Allergies  Allergen Reactions  . Clonidine Derivatives Other (See Comments)    Ear Pain, Dizziness, Insomnia, migraine, constipation. Patient stated that medicine kept her off balance.  . Prednisone Other (See Comments)    Mood swings  . Valtrex [Valacyclovir Hcl] Other (See Comments)    Doesn't remember   . Vaseretic [Enalapril-Hydrochlorothiazide] Other (See Comments)    Gout     Social History:  The patient  reports that she has never smoked. She has never used smokeless tobacco. She reports that she does not drink alcohol or use  drugs.   Family History  Problem Relation Age of Onset  . Heart attack Mother   . Hypertension Mother   . Stroke Father   . Heart disease Sister   . Stroke Sister   . Heart disease Brother   . Stroke Brother   . Stroke Sister   . Aneurysm Brother     ROS:  Please see the history of present illness.   All other ROS neg PHYSICAL EXAM: VS:  BP 140/88   Pulse 76   Ht 5\' 4"  (1.626 m)   Wt 286 lb 1.9 oz (129.8 kg)   SpO2 99%   BMI 49.11 kg/m   GEN: Well nourished, well developed, in no acute distress obese HEENT: normal  Neck: no JVD, carotid bruits, or masses Cardiac: RRR; no murmurs, rubs, or gallops,no edema  Respiratory:  clear to auscultation bilaterally, normal work of breathing GI: soft, nontender, nondistended, + BS MS: no deformity or atrophy  Skin: warm and dry, no rash Neuro:  Alert and Oriented x 3, Strength and sensation are intact Psych: euthymic mood, full affect    EKG:  EKG was ordered today 03/05/16-sinus rhythm, 66, no other abnormalities. 05/24/14-sinus rhythm, 65, no other abnormalities, poor R-wave progression.     ECHO: 02/25/17   - Left ventricle: The cavity size was normal. Systolic function was   normal. The estimated ejection fraction was in the range of 60%   to 65%. Wall motion was normal; there were no regional wall   motion abnormalities. Doppler parameters are consistent with   abnormal left ventricular relaxation (grade 1 diastolic   dysfunction). - Mitral valve: Calcified annulus. Mildly thickened leaflets . - Atrial septum: No defect or patent foramen ovale was identified.  ASSESSMENT AND PLAN:  Shortness of breath/dyspnea  - Likely multifactorial but certainly morbid obesity is playing a major role. Continue to encourage conditioning.  - Echocardiogram reassuring  Irregular heartbeat  - I did not appreciate any ectopy beats when listening to her on auscultation. She may been having frequent PVCs or PACs.  Overall doing quite  well.  Essential hypertension  - Previously challenging to control. Tried to utilize spironolactone but she ended up with dehydration/prerenal azotemia with creatinine of 2, BUN of 62.  - Trying to avoid heavy diuresis.  - At one point she tried clonidine but she had side effects.  Overall doing fairly well.  Morbid obesity  - Has tried Weight Watchers, quite motivated.  Continue to work on carb reduction.  Diabetes with hypertension  - Metformin. Encourage weight loss.  - Encourage statin use with diagnosis of diabetes.  OSA  - CPAP in 10/2016  Left eye blindness  - [facility] soon .  Signed, Donato Schultz, MD [facility]  03/17/2018 4:38 PM

## 2018-07-14 ENCOUNTER — Ambulatory Visit: Payer: 59 | Admitting: Skilled Nursing Facility1

## 2018-08-23 ENCOUNTER — Other Ambulatory Visit: Payer: Self-pay | Admitting: Obstetrics and Gynecology

## 2018-08-23 DIAGNOSIS — Z1231 Encounter for screening mammogram for malignant neoplasm of breast: Secondary | ICD-10-CM

## 2018-10-11 ENCOUNTER — Ambulatory Visit
Admission: RE | Admit: 2018-10-11 | Discharge: 2018-10-11 | Disposition: A | Discharge status: Home / Self Care | Payer: 59 | Source: Ambulatory Visit | Attending: Obstetrics and Gynecology | Admitting: Obstetrics and Gynecology

## 2018-10-11 DIAGNOSIS — Z1231 Encounter for screening mammogram for malignant neoplasm of breast: Secondary | ICD-10-CM

## 2019-03-10 ENCOUNTER — Telehealth: Payer: Self-pay

## 2019-03-10 NOTE — Telephone Encounter (Signed)
YOUR CARDIOLOGY TEAM HAS ARRANGED FOR AN E-VISIT FOR YOUR APPOINTMENT - PLEASE REVIEW IMPORTANT INFORMATION BELOW SEVERAL DAYS PRIOR TO YOUR APPOINTMENT  Due to the recent COVID-19 pandemic, we are transitioning in-person office visits to tele-medicine visits in an effort to decrease unnecessary exposure to our patients, their families, and staff. These visits are billed to your insurance just like a normal visit is. We also encourage you to sign up for MyChart if you have not already done so. You will need a smartphone if possible. For patients that do not have this, we can still complete the visit using a regular telephone but do prefer a smartphone to enable video when possible. You may have a family member that lives with you that can help. If possible, we also ask that you have a blood pressure cuff and scale at home to measure your blood pressure, heart rate and weight prior to your scheduled appointment. Patients with clinical needs that need an in-person evaluation and testing will still be able to come to the office if absolutely necessary. If you have any questions, feel free to call our office.     YOUR PROVIDER WILL BE USING THE FOLLOWING PLATFORM TO COMPLETE YOUR VISIT: [email or web address]  . IF USING MYCHART - How to Download the MyChart App to Your SmartPhone   - If Apple, go to App Store and type in MyChart in the search bar and download the app. If Android, ask patient to go to Google Play Store and type in MyChart in the search bar and download the app. The app is free but as with any other app downloads, your phone may require you to verify saved payment information or Apple/Android password.  - You will need to then log into the app with your MyChart username and password, and select [facility] as your healthcare provider to link the account.  - When it is time for your visit, go to the MyChart app, find appointments, and click Begin Video Visit. Be sure to Select Allow for your device to  access the Microphone and Camera for your visit. You will then be connected, and your provider will be with you shortly.  **If you have any issues connecting or need assistance, please contact MyChart service desk [phone] ([phone])**  **If using a computer, in order to ensure the best quality for your visit, you will need to use either of the following Internet Browsers: Google Chrome or Microsoft Edge**  . IF USING DOXIMITY or [email or web address].ME - The staff will give you instructions on receiving your link to join the meeting the day of your visit.      2-3 DAYS BEFORE YOUR APPOINTMENT  You will receive a telephone call from one of our [facility] team members - your caller ID may say "Unknown caller." If this is a video visit, we will walk you through how to get the video launched on your phone. We will remind you check your blood pressure, heart rate and weight prior to your scheduled appointment. If you have an Apple Watch or Kardia, please upload any pertinent ECG strips the day before or morning of your appointment to MyChart. Our staff will also make sure you have reviewed the consent and agree to move forward with your scheduled tele-health visit.     THE DAY OF YOUR APPOINTMENT  Approximately 15 minutes prior to your scheduled appointment, you will receive a telephone call from one of HeartCare team - your caller ID may say "Unknown caller."    Our staff will confirm medications, vital signs for the day and any symptoms you may be experiencing. Please have this information available prior to the time of visit start. It may also be helpful for you to have a pad of paper and pen handy for any instructions given during your visit. They will also walk you through joining the smartphone meeting if this is a video visit.    CONSENT FOR TELE-HEALTH VISIT - PLEASE REVIEW  I hereby voluntarily request, consent and authorize [facility] and its employed or contracted physicians, physician  assistants, nurse practitioners or other licensed health care professionals (the Practitioner), to provide me with telemedicine health care services (the "Services") as deemed necessary by the treating Practitioner. I acknowledge and consent to receive the Services by the Practitioner via telemedicine. I understand that the telemedicine visit will involve communicating with the Practitioner through live audiovisual communication technology and the disclosure of certain medical information by electronic transmission. I acknowledge that I have been given the opportunity to request an in-person assessment or other available alternative prior to the telemedicine visit and am voluntarily participating in the telemedicine visit.  I understand that I have the right to withhold or withdraw my consent to the use of telemedicine in the course of my care at any time, without affecting my right to future care or treatment, and that the Practitioner or I may terminate the telemedicine visit at any time. I understand that I have the right to inspect all information obtained and/or recorded in the course of the telemedicine visit and may receive copies of available information for a reasonable fee.  I understand that some of the potential risks of receiving the Services via telemedicine include:  . Delay or interruption in medical evaluation due to technological equipment failure or disruption; . Information transmitted may not be sufficient (e.g. poor resolution of images) to allow for appropriate medical decision making by the Practitioner; and/or  . In rare instances, security protocols could fail, causing a breach of personal health information.  Furthermore, I acknowledge that it is my responsibility to provide information about my medical history, conditions and care that is complete and accurate to the best of my ability. I acknowledge that Practitioner's advice, recommendations, and/or decision may be based on  factors not within their control, such as incomplete or inaccurate data provided by me or distortions of diagnostic images or specimens that may result from electronic transmissions. I understand that the practice of medicine is not an exact science and that Practitioner makes no warranties or guarantees regarding treatment outcomes. I acknowledge that I will receive a copy of this consent concurrently upon execution via email to the email address I last provided but may also request a printed copy by calling the office of [facility].    I understand that my insurance will be billed for this visit.   I have read or had this consent read to me. . I understand the contents of this consent, which adequately explains the benefits and risks of the Services being provided via telemedicine.  . I have been provided ample opportunity to ask questions regarding this consent and the Services and have had my questions answered to my satisfaction. . I give my informed consent for the services to be provided through the use of telemedicine in my medical care  By participating in this telemedicine visit I agree to the above.  

## 2019-03-14 ENCOUNTER — Encounter: Payer: Self-pay | Admitting: Cardiology

## 2019-03-14 ENCOUNTER — Other Ambulatory Visit: Payer: Self-pay

## 2019-03-14 ENCOUNTER — Telehealth (INDEPENDENT_AMBULATORY_CARE_PROVIDER_SITE_OTHER): Payer: 59 | Admitting: Cardiology

## 2019-03-14 DIAGNOSIS — R002 Palpitations: Secondary | ICD-10-CM

## 2019-03-14 DIAGNOSIS — I10 Essential (primary) hypertension: Secondary | ICD-10-CM

## 2019-03-14 DIAGNOSIS — R0609 Other forms of dyspnea: Secondary | ICD-10-CM

## 2019-03-14 NOTE — Patient Instructions (Signed)
Medication Instructions:  The current medical regimen is effective;  continue present plan and medications.  If you need a refill on your cardiac medications before your next appointment, please call your pharmacy.   Follow-Up: At [facility], you and your health needs are our priority.  As part of our continuing mission to provide you with exceptional heart care, we have created designated Provider Care Teams.  These Care Teams include your primary Cardiologist (physician) and Advanced Practice Providers (APPs -  Physician Assistants and Nurse Practitioners) who all work together to provide you with the care you need, when you need it. You will need a follow up appointment in 12 months.  Please call our office 2 months in advance to schedule this appointment.  You may see Mark Skains, MD or one of the following Advanced Practice Providers on your designated Care Team:   Lori Gerhardt, NP Laura Ingold, NP . Jill McDaniel, NP  Thank you for choosing [facility]!!      

## 2019-03-14 NOTE — Progress Notes (Signed)
Virtual Visit via Video Note   This visit type was conducted due to national recommendations for restrictions regarding the COVID-19 Pandemic (e.g. social distancing) in an effort to limit this patient's exposure and mitigate transmission in our community.  Due to her co-morbid illnesses, this patient is at least at moderate risk for complications without adequate follow up.  This format is felt to be most appropriate for this patient at this time.  All issues noted in this document were discussed and addressed.  A limited physical exam was performed with this format.  Please refer to the patient's chart for her consent to telehealth for [facility].   Date:  03/14/2019   ID:  Wendy Downs, DOB 1951-04-27, MRN [id number]  Patient Location: Home Provider Location: Home  PCP:  Merri Brunette, MD  Cardiologist:  Donato Schultz, MD  Electrophysiologist:  None   Evaluation Performed:  Follow-Up Visit  Chief Complaint:  Follow up HTN  History of Present Illness:    Wendy Downs is a 68 y.o. female with hx of difficult to control hypertension, morbid obesity, and hyperlipidemia here for evaluation of irregular heartbeat noticed by her OB/GYN. She herself, the patient, did not notice necessarily any irregularity. She has shortness of breath that has been chronic. No chest pain, no syncope. No orthopnea.  Has never smoked in her life. She has battled with hypertension for several years, since she was 73. Prior secondary workup was unremarkable including renal duplex Dopplers, normal.   Previous medications tried for hypertension include spironolactone however she developed dehydration from over diuresis. She has also tried higher dose amlodipine but developed worsening edema.  Had gout on HCTZ.  Saw Dr. Sherene Sires 04/30/15 - no PFT restrictive evidence of sarcoid. Obesity is primary problem, no need for pulm fu.   A1c-6.9 -weight watchers, 8 pounds loss. Migraines - triggered by  lights. Feeling well otherwise. Mild edema.  03/17/2018-overall doing quite well.  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  No chest pain, no changes in shortness of breath, actually improved.  No fevers chills.  03/14/2019 -here for follow-up hypertension.  She is been doing really well with weight loss.  See below for details.  Lost close to 60 pounds.  No chest pain fevers chills nausea vomiting.  Shortness of breath has improved.  She is feeling great.  The patient does not have symptoms concerning for COVID-19 infection (fever, chills, cough, or new shortness of breath).    Past Medical History:  Diagnosis Date  . Diabetes mellitus without complication (HCC)   . Gout   . Hyperlipidemia   . Hypertension   . Morbid obesity (HCC)   . Shingles    Past Surgical History:  Procedure Laterality Date  . APPENDECTOMY    . CARPAL TUNNEL RELEASE    . CHOLECYSTECTOMY       Current Meds  Medication Sig  . albuterol (PROVENTIL HFA;VENTOLIN HFA) 108 (90 Base) MCG/ACT inhaler TAKE 2 PUFFS BY MOUTH EVERY 4 HOURS AS NEEDED  . allopurinol (ZYLOPRIM) 100 MG tablet Take 100 mg by mouth 2 (two) times daily.  . B Complex Vitamins (VITAMIN B COMPLEX IJ) Inject 1 mL as directed every 30 (thirty) days. Vitamin b12 injection  . brimonidine (ALPHAGAN P) 0.1 % SOLN Place 1 drop into both eyes 3 (three) times daily.   Marland Kitchen BYSTOLIC 20 MG TABS TAKE 1 TABLET (20 MG TOTAL) BY MOUTH DAILY.  . cetirizine (ZYRTEC) 10 MG tablet Take 10 mg by  mouth daily as needed for allergies.  . furosemide (LASIX) 20 MG tablet Take 20 mg by mouth daily.   Marland Kitchen. latanoprost (XALATAN) 0.005 % ophthalmic solution Place 1 drop into the right eye at bedtime.   . Multiple Vitamins-Minerals (MEGA MULTI WOMEN PO) Take by mouth.  . prednisoLONE acetate (PRED FORTE) 1 % ophthalmic suspension Apply 1 drop to eye 3 (three) times daily.  Marland Kitchen. telmisartan (MICARDIS) 80 MG tablet Take 1 tablet by mouth daily.  . timolol (TIMOPTIC) 0.5 % ophthalmic  solution Place 2 drops into both eyes 2 (two) times daily.  . TURMERIC CURCUMIN PO Take 900 mg by mouth daily.     Allergies:   Clonidine derivatives; Prednisone; Valtrex [valacyclovir hcl]; and Vaseretic [enalapril-hydrochlorothiazide]   Social History   Tobacco Use  . Smoking status: Never Smoker  . Smokeless tobacco: Never Used  Substance Use Topics  . Alcohol use: No    Alcohol/week: 0.0 standard drinks  . Drug use: No     Family Hx: The patient's family history includes Aneurysm in her brother; Heart attack in her mother; Heart disease in her brother and sister; Hypertension in her mother; Stroke in her brother, father, sister, and sister.  ROS:   Please see the history of present illness.     All other systems reviewed and are negative.   Prior CV studies:   The following studies were reviewed today:  ECHO: 02/25/17  - Left ventricle: The cavity size was normal. Systolic function was normal. The estimated ejection fraction was in the range of 60% to 65%. Wall motion was normal; there were no regional wall motion abnormalities. Doppler parameters are consistent with abnormal left ventricular relaxation (grade 1 diastolic dysfunction). - Mitral valve: Calcified annulus. Mildly thickened leaflets . - Atrial septum: No defect or patent foramen ovale was identified.   Labs/Other Tests and Data Reviewed:    EKG:   03/05/16-sinus rhythm, 66, no other abnormalities. 05/24/14-sinus rhythm, 65, no other abnormalities, poor R-wave progression.     Recent Labs: No results found for requested labs within last 8760 hours.   Recent Lipid Panel No results found for: CHOL, TRIG, HDL, CHOLHDL, LDLCALC, LDLDIRECT  Wt Readings from Last 3 Encounters:  03/14/19 223 lb (101.2 kg)  03/17/18 286 lb 1.9 oz (129.8 kg)  01/04/18 289 lb 12.8 oz (131.5 kg)     Objective:    Vital Signs:  Ht 5\' 4"  (1.626 m)   Wt 223 lb (101.2 kg)   BMI 38.28 kg/m    VITAL SIGNS:   reviewed GEN:  no acute distress EYES:  sclerae anicteric, EOMI - Extraocular Movements Intact RESPIRATORY:  normal respiratory effort, symmetric expansion SKIN:  no rash, lesions or ulcers. MUSCULOSKELETAL:  no obvious deformities. NEURO:  alert and oriented x 3, no obvious focal deficit PSYCH:  normal affect  ASSESSMENT & PLAN:    Shortness of breath/dyspnea  - Likely multifactorial but certainly morbid obesity is playing a major role. Continue to encourage conditioning.  - Echocardiogram reassuring.  - feels better with weight loss.   Irregular heartbeat  - I did not appreciate any ectopy beats when listening to her on auscultation. She may been having frequent PVCs or PACs.  Overall doing quite well. No further occurences   Essential hypertension  - Previously challenging to control. Tried to utilize spironolactone but she ended up with dehydration/prerenal azotemia with creatinine of 2, BUN of 62.  - Off amlodipine. Doing much better with weight loss.   Morbid  obesity  - Has tried Weight Watchers, quite motivated.  Continue to work on carb reduction. Now on [facility].Off of flour and sugar. 4oz protein 6 veg 6 fruit. Great job, 60 pound weight loss.   Diabetes with hypertension  - has been able to come off several meds.   OSA  - CPAP in 10/2016. 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    COVID-19 Education: The signs and symptoms of COVID-19 were discussed with the patient and how to seek care for testing (follow up with PCP or arrange E-visit).  The importance of social distancing was discussed today.  Time:   Today, I have spent 11 minutes with the patient with telehealth technology discussing the above problems.     Medication Adjustments/Labs and Tests Ordered: Current medicines are reviewed at length with the patient today.  Concerns regarding medicines are outlined above.   Tests Ordered: No orders  of the defined types were placed in this encounter.   Medication Changes: No orders of the defined types were placed in this encounter.   Disposition:  Follow up in 1 year(s)  Signed, Donato Schultz, MD  03/14/2019 4:21 PM    [facility]

## 2019-03-20 ENCOUNTER — Other Ambulatory Visit: Payer: Self-pay | Admitting: *Deleted

## 2019-03-20 DIAGNOSIS — Z20822 Contact with and (suspected) exposure to covid-19: Secondary | ICD-10-CM

## 2019-03-30 NOTE — Addendum Note (Signed)
Addended by: Brigitte Pulse on: 03/30/2019 11:51 AM   Modules accepted: Orders

## 2019-04-03 ENCOUNTER — Other Ambulatory Visit: Payer: Self-pay | Admitting: *Deleted

## 2019-04-03 DIAGNOSIS — Z20822 Contact with and (suspected) exposure to covid-19: Secondary | ICD-10-CM

## 2019-04-09 LAB — NOVEL CORONAVIRUS, NAA: SARS-CoV-2, NAA: NOT DETECTED

## 2019-09-20 ENCOUNTER — Other Ambulatory Visit: Payer: Self-pay | Admitting: Obstetrics and Gynecology

## 2019-09-20 DIAGNOSIS — Z1231 Encounter for screening mammogram for malignant neoplasm of breast: Secondary | ICD-10-CM

## 2019-09-22 ENCOUNTER — Encounter (HOSPITAL_COMMUNITY): Payer: Self-pay | Admitting: Emergency Medicine

## 2019-09-22 ENCOUNTER — Other Ambulatory Visit: Payer: Self-pay

## 2019-09-22 ENCOUNTER — Emergency Department (HOSPITAL_COMMUNITY): Payer: 59

## 2019-09-22 ENCOUNTER — Observation Stay (HOSPITAL_COMMUNITY)
Admission: EM | Admit: 2019-09-22 | Discharge: 2019-09-23 | Disposition: A | Discharge status: Home / Self Care | Payer: 59 | Attending: Internal Medicine | Admitting: Internal Medicine

## 2019-09-22 DIAGNOSIS — Z6834 Body mass index (BMI) 34.0-34.9, adult: Secondary | ICD-10-CM | POA: Insufficient documentation

## 2019-09-22 DIAGNOSIS — E119 Type 2 diabetes mellitus without complications: Secondary | ICD-10-CM

## 2019-09-22 DIAGNOSIS — M109 Gout, unspecified: Secondary | ICD-10-CM | POA: Diagnosis not present

## 2019-09-22 DIAGNOSIS — E785 Hyperlipidemia, unspecified: Secondary | ICD-10-CM | POA: Diagnosis present

## 2019-09-22 DIAGNOSIS — Z7984 Long term (current) use of oral hypoglycemic drugs: Secondary | ICD-10-CM | POA: Insufficient documentation

## 2019-09-22 DIAGNOSIS — I10 Essential (primary) hypertension: Secondary | ICD-10-CM | POA: Diagnosis present

## 2019-09-22 DIAGNOSIS — Z20828 Contact with and (suspected) exposure to other viral communicable diseases: Secondary | ICD-10-CM | POA: Diagnosis not present

## 2019-09-22 DIAGNOSIS — Z7982 Long term (current) use of aspirin: Secondary | ICD-10-CM | POA: Diagnosis not present

## 2019-09-22 DIAGNOSIS — N1831 Chronic kidney disease, stage 3a: Secondary | ICD-10-CM | POA: Insufficient documentation

## 2019-09-22 DIAGNOSIS — D860 Sarcoidosis of lung: Secondary | ICD-10-CM | POA: Diagnosis not present

## 2019-09-22 DIAGNOSIS — G459 Transient cerebral ischemic attack, unspecified: Principal | ICD-10-CM | POA: Diagnosis present

## 2019-09-22 DIAGNOSIS — N189 Chronic kidney disease, unspecified: Secondary | ICD-10-CM | POA: Diagnosis present

## 2019-09-22 DIAGNOSIS — Z79899 Other long term (current) drug therapy: Secondary | ICD-10-CM | POA: Insufficient documentation

## 2019-09-22 DIAGNOSIS — D869 Sarcoidosis, unspecified: Secondary | ICD-10-CM | POA: Diagnosis present

## 2019-09-22 DIAGNOSIS — H409 Unspecified glaucoma: Secondary | ICD-10-CM | POA: Insufficient documentation

## 2019-09-22 DIAGNOSIS — Z7902 Long term (current) use of antithrombotics/antiplatelets: Secondary | ICD-10-CM | POA: Insufficient documentation

## 2019-09-22 DIAGNOSIS — E1122 Type 2 diabetes mellitus with diabetic chronic kidney disease: Secondary | ICD-10-CM | POA: Insufficient documentation

## 2019-09-22 DIAGNOSIS — N179 Acute kidney failure, unspecified: Secondary | ICD-10-CM | POA: Diagnosis present

## 2019-09-22 DIAGNOSIS — I129 Hypertensive chronic kidney disease with stage 1 through stage 4 chronic kidney disease, or unspecified chronic kidney disease: Secondary | ICD-10-CM | POA: Insufficient documentation

## 2019-09-22 LAB — CREATININE, SERUM
Creatinine, Ser: 1.24 mg/dL — ABNORMAL HIGH (ref 0.44–1.00)
GFR calc Af Amer: 51 mL/min — ABNORMAL LOW (ref >60)
GFR calc non Af Amer: 44 mL/min — ABNORMAL LOW (ref >60)

## 2019-09-22 LAB — DIFFERENTIAL
Abs Immature Granulocytes: 0.02 10*3/uL (ref 0.00–0.07)
Basophils Absolute: 0.1 10*3/uL (ref 0.0–0.1)
Basophils Relative: 1 %
Eosinophils Absolute: 0.2 10*3/uL (ref 0.0–0.5)
Eosinophils Relative: 4 %
Immature Granulocytes: 0 %
Lymphocytes Relative: 36 %
Lymphs Abs: 2.1 10*3/uL (ref 0.7–4.0)
Monocytes Absolute: 0.6 10*3/uL (ref 0.1–1.0)
Monocytes Relative: 11 %
Neutro Abs: 2.8 10*3/uL (ref 1.7–7.7)
Neutrophils Relative %: 48 %

## 2019-09-22 LAB — COMPREHENSIVE METABOLIC PANEL
ALT: 13 U/L (ref 0–44)
AST: 17 U/L (ref 15–41)
Albumin: 3.8 g/dL (ref 3.5–5.0)
Alkaline Phosphatase: 51 U/L (ref 38–126)
Anion gap: 10 (ref 5–15)
BUN: 15 mg/dL (ref 8–23)
CO2: 27 mmol/L (ref 22–32)
Calcium: 9.2 mg/dL (ref 8.9–10.3)
Chloride: 103 mmol/L (ref 98–111)
Creatinine, Ser: 1.29 mg/dL — ABNORMAL HIGH (ref 0.44–1.00)
GFR calc Af Amer: 49 mL/min — ABNORMAL LOW (ref >60)
GFR calc non Af Amer: 42 mL/min — ABNORMAL LOW (ref >60)
Glucose, Bld: 97 mg/dL (ref 70–99)
Potassium: 4 mmol/L (ref 3.5–5.1)
Sodium: 140 mmol/L (ref 135–145)
Total Bilirubin: 1.5 mg/dL — ABNORMAL HIGH (ref 0.3–1.2)
Total Protein: 6.8 g/dL (ref 6.5–8.1)

## 2019-09-22 LAB — CBC
HCT: 39 % (ref 36.0–46.0)
HCT: 40.4 % (ref 36.0–46.0)
Hemoglobin: 12.9 g/dL (ref 12.0–15.0)
Hemoglobin: 13.3 g/dL (ref 12.0–15.0)
MCH: 30.9 pg (ref 26.0–34.0)
MCH: 30.9 pg (ref 26.0–34.0)
MCHC: 33.1 g/dL (ref 30.0–36.0)
MCHC: 32.9 g/dL (ref 30.0–36.0)
MCV: 93.3 fL (ref 80.0–100.0)
MCV: 93.7 fL (ref 80.0–100.0)
Platelets: 227 10*3/uL (ref 150–400)
Platelets: 264 10*3/uL (ref 150–400)
RBC: 4.18 MIL/uL (ref 3.87–5.11)
RBC: 4.31 MIL/uL (ref 3.87–5.11)
RDW: 12.9 % (ref 11.5–15.5)
RDW: 13.2 % (ref 11.5–15.5)
WBC: 5.9 10*3/uL (ref 4.0–10.5)
WBC: 5.1 10*3/uL (ref 4.0–10.5)
nRBC: 0 % (ref 0.0–0.2)
nRBC: 0 % (ref 0.0–0.2)

## 2019-09-22 LAB — PROTIME-INR
INR: 1 (ref 0.8–1.2)
Prothrombin Time: 13.3 seconds (ref 11.4–15.2)

## 2019-09-22 LAB — APTT: aPTT: 27 seconds (ref 24–36)

## 2019-09-22 LAB — HIV ANTIBODY (ROUTINE TESTING W REFLEX): HIV Screen 4th Generation wRfx: NONREACTIVE

## 2019-09-22 MED ORDER — STROKE: EARLY STAGES OF RECOVERY BOOK
Freq: Once | Status: AC
  Administered 2019-09-23: 03:00:00
  Filled 2019-09-22: qty 1

## 2019-09-22 MED ORDER — ACETAMINOPHEN 650 MG RE SUPP: 650.0000 mg | RECTAL | Status: DC | PRN

## 2019-09-22 MED ORDER — ASPIRIN EC 81 MG PO TBEC
81.0000 mg | DELAYED_RELEASE_TABLET | Freq: Every day | ORAL | Status: DC
  Administered 2019-09-22 – 2019-09-23 (×2): 81 mg via ORAL
  Filled 2019-09-22 (×2): qty 1

## 2019-09-22 MED ORDER — ACETAMINOPHEN 160 MG/5ML PO SOLN: 650.0000 mg | ORAL | Status: DC | PRN

## 2019-09-22 MED ORDER — ACETAMINOPHEN 325 MG PO TABS: 650.0000 mg | ORAL_TABLET | ORAL | Status: DC | PRN

## 2019-09-22 MED ORDER — ATORVASTATIN CALCIUM 80 MG PO TABS: 80.0000 mg | ORAL_TABLET | Freq: Every day | ORAL | Status: DC

## 2019-09-22 MED ORDER — ENOXAPARIN SODIUM 40 MG/0.4ML ~~LOC~~ SOLN
40.0000 mg | SUBCUTANEOUS | Status: DC
  Administered 2019-09-22: 40 mg via SUBCUTANEOUS
  Filled 2019-09-22: qty 0.4

## 2019-09-22 NOTE — Consult Note (Addendum)
NEURO HOSPITALIST  CONSULT   Requesting Physician: Dr. Stevie Kernykstra    Chief Complaint: Aphasia  History obtained from:  Patient    HPI:                                                                                                                                         Wendy Downs is an 68 y.o. female with PMH HTN, HLD, DM who [facility] after having an episode of difficulty getting words out yesterday.  On 12/10 at about 1515 patient noted some difficulty getting words out that lasted about 3-5 minutes. This has not ever happened before and did not reoccur since yesterday. She did take 2 baby ASA after the event. Since then she has had a dull HA across her forehead that disappeared on its own, and she has had left neck pain/ache rated 4/10 pain. The ache feeling is still present in neck. Patient stopped taking ASA  Daily about 9 months ago  When she started having stomach problems. No prior stroke history noted. Patient in blind in left eye. Denies CP, SOB, andy focal weakness, ETOH, cigarettes or drug abuse.  ED course:  CTH: nothing acute BG: 171 BP: 138/114  Modified Rankin: Rankin Score=1  NIHSS: 1; sensation left leg   Past Medical History:  Diagnosis Date  . Diabetes mellitus without complication (HCC)   . Gout   . Hyperlipidemia   . Hypertension   . Morbid obesity (HCC)   . Shingles     Past Surgical History:  Procedure Laterality Date  . APPENDECTOMY    . CARPAL TUNNEL RELEASE    . CHOLECYSTECTOMY      Family History  Problem Relation Age of Onset  . Heart attack Mother   . Hypertension Mother   . Stroke Father   . Heart disease Sister   . Stroke Sister   . Heart disease Brother   . Stroke Brother   . Stroke Sister   . Aneurysm Brother          Social History:  reports that she has never smoked. She has never used smokeless tobacco. She reports that she does not drink alcohol or use  drugs.  Allergies:  Allergies  Allergen Reactions  . Clonidine Derivatives Other (See Comments)    Ear Pain, Dizziness, Insomnia, migraine, constipation. Patient stated that medicine kept her off balance.  . Prednisone Other (See Comments)    Mood swings  . Valtrex [Valacyclovir Hcl] Other (See Comments)    Doesn't remember   . Vaseretic [Enalapril-Hydrochlorothiazide] Other (See Comments)    Gout  Medications:                                                                                                                           No current facility-administered medications for this encounter.   Current Outpatient Medications  Medication Sig Dispense Refill  . albuterol (PROVENTIL HFA;VENTOLIN HFA) 108 (90 Base) MCG/ACT inhaler TAKE 2 PUFFS BY MOUTH EVERY 4 HOURS AS NEEDED 8.5 Inhaler 1  . allopurinol (ZYLOPRIM) 100 MG tablet Take 100 mg by mouth 2 (two) times daily.    . B Complex Vitamins (VITAMIN B COMPLEX IJ) Inject 1 mL as directed every 30 (thirty) days. Vitamin b12 injection    . brimonidine (ALPHAGAN P) 0.1 % SOLN Place 1 drop into both eyes 3 (three) times daily.     Marland Kitchen BYSTOLIC 20 MG TABS TAKE 1 TABLET (20 MG TOTAL) BY MOUTH DAILY. 30 tablet 0  . cetirizine (ZYRTEC) 10 MG tablet Take 10 mg by mouth daily as needed for allergies.    . furosemide (LASIX) 20 MG tablet Take 20 mg by mouth daily.     Marland Kitchen latanoprost (XALATAN) 0.005 % ophthalmic solution Place 1 drop into the right eye at bedtime.     . Multiple Vitamins-Minerals (MEGA MULTI WOMEN PO) Take by mouth.    . prednisoLONE acetate (PRED FORTE) 1 % ophthalmic suspension Apply 1 drop to eye 3 (three) times daily.    Marland Kitchen telmisartan (MICARDIS) 80 MG tablet Take 1 tablet by mouth daily.  1  . timolol (TIMOPTIC) 0.5 % ophthalmic solution Place 2 drops into both eyes 2 (two) times daily.  4  . TURMERIC CURCUMIN PO Take 900 mg by mouth daily.       ROS:                                                                                                                                        ROS was performed and is negative except as noted in HPI    General Examination:  Blood pressure (!) 138/114, pulse 71, temperature 98.3 F (36.8 C), temperature source Oral, resp. rate 20, SpO2 100 %.  Physical Exam  Constitutional: Appears well-developed and well-nourished.  Psych: Affect appropriate to situation Eyes: left eye with cloudy cornea s/p corneal transplant HENT: Normocephalic, no lesions, without obvious abnormality.   Musculoskeletal-no joint tenderness, deformity or swelling Cardiovascular: Normal rate and regular rhythm.  Respiratory: Effort normal, non-labored breathing saturations WNL on RA GI: Soft.  No distension.   Skin: WDI  Neurological Examination Mental Status: Alert, oriented, thought content appropriate.  Speech fluent without evidence of aphasia.  Able to follow  commands without difficulty. Cranial Nerves: II: visual fields of right eye are normal; left eye blind  III,IV, VI: ptosis not present, disconjugate gaze. ,right pupil is reactive to light V,VII: smile symmetric, facial light touch sensation normal bilaterally VIII: hearing normal bilaterally IX,X: uvula rises midline XI: bilateral shoulder shrug XII: midline tongue extension Motor: Right : Upper extremity   5/5  Left:     Upper extremity   5/5  Lower extremity   5/5   Lower extremity   5/5 Tone and bulk:normal tone throughout; no atrophy noted Sensory:  light touch decreased on left leg ( this is not a new problem per patient). Deep Tendon Reflexes: hypoactive reflexes knee jerk, 2+ biceps Cerebellar: No ataxia noted Gait: deferred   Lab Results: Basic Metabolic Panel: Recent Labs  Lab 09/22/19 1305  NA 140  K 4.0  CL 103  CO2 27  GLUCOSE 97  BUN 15  CREATININE 1.29*  CALCIUM 9.2    CBC: Recent Labs  Lab  09/22/19 1305  WBC 5.9  NEUTROABS 2.8  HGB 12.9  HCT 39.0  MCV 93.3  PLT 227    Imaging: CT HEAD WO CONTRAST  Result Date: 09/22/2019 CLINICAL DATA:  Multiple episodes of expressive aphasia over the past 2 days, since resolved EXAM: CT HEAD WITHOUT CONTRAST TECHNIQUE: Contiguous axial images were obtained from the base of the skull through the vertex without intravenous contrast. COMPARISON:  CT head 02/08/2008, MR brain 05/23/2016 (images only), 08/23/2014 FINDINGS: Brain: No evidence of acute infarction, hemorrhage, hydrocephalus, extra-axial collection or mass lesion/mass effect. Symmetric prominence of the ventricles, cisterns and sulci compatible with parenchymal volume loss. Patchy areas of white matter hypoattenuation are most compatible with chronic microvascular angiopathy. Vascular: Atherosclerotic calcification of the carotid siphons. No hyperdense vessel. Skull: No calvarial fracture or suspicious osseous lesion. No scalp swelling or hematoma. Sinuses/Orbits: Minimal maxillary mural thickening without air-fluid levels. Remaining paranasal sinuses and mastoids are predominantly clear. Left ocular drainage device is noted, stable since 2009 CT. Orbits are otherwise unremarkable. Other: None IMPRESSION: 1. No acute intracranial abnormality. 2. Stable parenchymal volume loss and chronic microvascular angiopathy. Electronically Signed   By: Kreg Shropshire M.D.   On: 09/22/2019 14:33      Valentina Lucks, MSN, NP-C [facility] [phone] 09/22/2019, 5:36 PM    Assessment: Wendy Downs is an 68 y.o. female with PMH HTN, HLD, DM who presented to [facility] ED after having an episode of difficulty getting words out yesterday.  1. Exam reveals no speech deficit, focal motor weakness or ataxia. LLE sensory deficit on exam is described as chronic by the patient.   2. CTH: No acute intracranial abnormality. Stable parenchymal volume loss and chronic microvascular angiopathy.  3.  Stroke Risk Factors - diabetes mellitus, hyperlipidemia and hypertension    Recommendations: -- MRI Brain  -- MRA of the head  -- Carotid  dopplers -- Echocardiogram -- ASA 81 mg po qd -- High intensity Statin if LDL > 70 -- HgbA1c, fasting lipid panel -- PT consult, OT consult, Speech consult -- Telemetry monitoring -- Frequent neuro checks -- Stroke swallow screen  -- BP goal: Normotensive. Out of permissive HTN time window -- Please page stroke NP  Or  PA  Or MD from 8am -4 pm  as this patient from this time will be  followed by the stroke.   You can look them up on [email or web address]  Password [email or web address]  I have seen and examined the patient. I have discussed the assessment and recommendations with Valentina Lucks, NP. 68 year old female with transient episode of aphasia. Most likely component of the DDx is TIA. Recommendations as above.  Electronically signed: Dr. Caryl Pina

## 2019-09-22 NOTE — ED Notes (Signed)
Patient transported to MRI 

## 2019-09-22 NOTE — ED Provider Notes (Signed)
[facility] EMERGENCY DEPARTMENT Provider Note   CSN: [id number] Arrival date & time: 09/22/19  1234     History Chief Complaint  Patient presents with  . Abdominal Pain  . Aphasia    Wendy Downs is a 68 y.o. female.  HPI Pt is a 68 year old female with PMH of DM, HTN, HLD, sarcoidosis who presents to the ED with concern for speech difficulty.  Patient reports approximately around 3:15 PM yesterday she was on the phone with her brother when she experienced 5 minutes of having word finding difficulty.  Patient reports she could think of what she needed to say but could not get the words out during this time.  Patient states that soon resolved and she noted a mild headache in the back of her neck that is similar to prior headaches she has had in the past.  No falls or recent trauma.  Patient has any fever or recent illness.  She states she went about her day without any recurrence.  Patient has history of glaucoma and has very little vision of her left eye.  She states she has not noticed any vision changes of her right eye.  No numbness, tingling, weakness.  Of note, patient has been dealing with chronic abdominal pain over the last few months and is following as an outpatient with GI.  She denies any worsening of her abdominal cramping.  No nausea, no vomiting.  Patient denies any urinary symptoms.  She states she is scheduled for an endoscopy next month.  Past Medical History:  Diagnosis Date  . Diabetes mellitus without complication ([place])   . Gout   . Hyperlipidemia   . Hypertension   . Morbid obesity ([place])   . Shingles     Patient Active Problem List   Diagnosis Date Noted  . TIA (transient ischemic attack) 09/22/2019  . Hyperlipidemia   . Gout   . Diabetes mellitus without complication ([place])   . Acute on chronic kidney failure ([place])   . Dyspnea on exertion 03/18/2015  . 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 HEADACHE, CHRONIC 02/20/2010  .  HYPERCHOLESTEROLEMIA 02/19/2010  . Morbid obesity due to excess calories ([place]) 02/19/2010  . ANEMIA, IRON DEFICIENCY 02/19/2010  . ANEMIA, PERNICIOUS 02/19/2010  . ANEMIA, B12 DEFICIENCY 02/19/2010  . ANXIETY 02/19/2010  . GLAUCOMA 02/19/2010  . Hypertension 02/19/2010  . ALLERGIC RHINITIS 02/19/2010  . GASTROESOPHAGEAL REFLUX DISEASE 02/19/2010    Past Surgical History:  Procedure Laterality Date  . APPENDECTOMY    . CARPAL TUNNEL RELEASE    . CHOLECYSTECTOMY       OB History   No obstetric history on file.     Family History  Problem Relation Age of Onset  . Heart attack Mother   . Hypertension Mother   . Stroke Father   . Heart disease Sister   . Stroke Sister   . Heart disease Brother   . Stroke Brother   . Stroke Sister   . Aneurysm Brother     Social History   Tobacco Use  . Smoking status: Never Smoker  . Smokeless tobacco: Never Used  Substance Use Topics  . Alcohol use: No    Alcohol/week: 0.0 standard drinks  . Drug use: No    Home Medications Prior to Admission medications   Medication Sig Start Date End Date Taking? Authorizing Provider  albuterol (PROVENTIL HFA;VENTOLIN HFA) 108 (90 Base) MCG/ACT inhaler TAKE 2 PUFFS BY MOUTH EVERY 4 HOURS AS NEEDED  Patient taking differently: Inhale 2 puffs into the lungs every 4 (four) hours as needed for wheezing or shortness of breath (seasonal allergies).  01/27/18  Yes Nyoka Cowden, MD  allopurinol (ZYLOPRIM) 100 MG tablet Take 200 mg by mouth daily.    Yes [provider]  brimonidine (ALPHAGAN P) 0.1 % SOLN Place 1 drop into the left eye 3 (three) times daily.    Yes [provider]  BYSTOLIC 20 MG TABS TAKE 1 TABLET (20 MG TOTAL) BY MOUTH DAILY. Patient taking differently: Take 20 mg by mouth at bedtime.  01/27/17  Yes Jake Bathe, MD  cetirizine (ZYRTEC) 10 MG tablet Take 10 mg by mouth daily as needed for allergies.   Yes [provider]  cyanocobalamin (,VITAMIN B-12,)  1000 MCG/ML injection Inject 1,000 mcg into the muscle every 30 (thirty) days.   Yes [provider]  famotidine (PEPCID) 10 MG tablet Take 20 mg by mouth at bedtime.   Yes [provider]  furosemide (LASIX) 20 MG tablet Take 20 mg by mouth daily.    Yes [provider]  ibuprofen (ADVIL) 200 MG tablet Take 400 mg by mouth every 6 (six) hours as needed (migraine headache).   Yes [provider]  latanoprost (XALATAN) 0.005 % ophthalmic solution Place 1 drop into the right eye at bedtime.    Yes [provider]  omeprazole (PRILOSEC) 40 MG capsule Take 40 mg by mouth daily.  08/21/19  Yes [provider]  prednisoLONE acetate (PRED FORTE) 1 % ophthalmic suspension Place 1 drop into the left eye 3 (three) times daily.  09/12/18  Yes [provider]  telmisartan (MICARDIS) 80 MG tablet Take 80 mg by mouth daily.  01/19/18  Yes [provider]  timolol (TIMOPTIC) 0.5 % ophthalmic solution Place 1 drop into both eyes 2 (two) times daily.  12/28/17  Yes [provider]  TURMERIC CURCUMIN PO Take 900 mg by mouth daily.   Yes [provider]    Allergies    Clonidine derivatives, Prednisone, Valtrex [valacyclovir hcl], and Vaseretic [enalapril-hydrochlorothiazide]  Review of Systems   Review of Systems  Constitutional: Negative for chills and fever.  HENT: Negative for congestion and sore throat.   Eyes: Negative for pain and visual disturbance.  Respiratory: Negative for cough and shortness of breath.   Cardiovascular: Negative for chest pain and palpitations.  Gastrointestinal: Positive for abdominal pain. Negative for diarrhea, nausea and vomiting.  Genitourinary: Negative for dysuria and hematuria.  Musculoskeletal: Negative for arthralgias and back pain.  Skin: Negative for color change and rash.  Neurological: Positive for speech difficulty and headaches. Negative for seizures and syncope.    Psychiatric/Behavioral: Negative for agitation and behavioral problems.  All other systems reviewed and are negative.   Physical Exam Updated Vital Signs BP (!) 156/81 (BP Location: Left Arm)   Pulse 69   Temp (!) 97.5 F (36.4 C) (Oral)   Resp 16   Ht 5\' 4"  (1.626 m)   Wt 91.6 kg   SpO2 100%   BMI 34.67 kg/m   Physical Exam Vitals and nursing note reviewed.  Constitutional:      General: She is not in acute distress.    Appearance: She is well-developed.  HENT:     Head: Normocephalic and atraumatic.  Eyes:     Conjunctiva/sclera: Conjunctivae normal.     Comments: Left eye with chronic findings from glaucoma/decreased vision Right pupil reactive to light   Cardiovascular:  Rate and Rhythm: Normal rate and regular rhythm.     Heart sounds: No murmur.  Pulmonary:     Effort: Pulmonary effort is normal. No respiratory distress.     Breath sounds: Normal breath sounds.  Abdominal:     Palpations: Abdomen is soft.     Tenderness: There is no abdominal tenderness.  Musculoskeletal:        General: No swelling or tenderness. Normal range of motion.     Cervical back: Neck supple.  Skin:    General: Skin is warm and dry.  Neurological:     General: No focal deficit present.     Mental Status: She is alert and oriented to person, place, and time. Mental status is at baseline.     Cranial Nerves: No cranial nerve deficit.     Sensory: No sensory deficit.     Motor: No weakness.     Coordination: Coordination normal.     Gait: Gait normal.     Comments: No aphasia or dysarthria Visual fields intact to confrontation 5/5 strength throughout Gross sensation intact   Psychiatric:        Mood and Affect: Mood normal.        Behavior: Behavior normal.     ED Results / Procedures / Treatments   Labs (all labs ordered are listed, but only abnormal results are displayed) Labs Reviewed  COMPREHENSIVE METABOLIC PANEL - Abnormal; Notable for the following components:       Result Value   Creatinine, Ser 1.29 (*)    Total Bilirubin 1.5 (*)    GFR calc non Af Amer 42 (*)    GFR calc Af Amer 49 (*)    All other components within normal limits  CREATININE, SERUM - Abnormal; Notable for the following components:   Creatinine, Ser 1.24 (*)    GFR calc non Af Amer 44 (*)    GFR calc Af Amer 51 (*)    All other components within normal limits  SARS CORONAVIRUS 2 (TAT 6-24 HRS)  PROTIME-INR  APTT  CBC  DIFFERENTIAL  HIV ANTIBODY (ROUTINE TESTING W REFLEX)  CBC  HEMOGLOBIN A1C  LIPID PANEL  CBG MONITORING, ED    EKG None  Radiology CT HEAD WO CONTRAST  Result Date: 09/22/2019 CLINICAL DATA:  Multiple episodes of expressive aphasia over the past 2 days, since resolved EXAM: CT HEAD WITHOUT CONTRAST TECHNIQUE: Contiguous axial images were obtained from the base of the skull through the vertex without intravenous contrast. COMPARISON:  CT head 02/08/2008, MR brain 05/23/2016 (images only), 08/23/2014 FINDINGS: Brain: No evidence of acute infarction, hemorrhage, hydrocephalus, extra-axial collection or mass lesion/mass effect. Symmetric prominence of the ventricles, cisterns and sulci compatible with parenchymal volume loss. Patchy areas of white matter hypoattenuation are most compatible with chronic microvascular angiopathy. Vascular: Atherosclerotic calcification of the carotid siphons. No hyperdense vessel. Skull: No calvarial fracture or suspicious osseous lesion. No scalp swelling or hematoma. Sinuses/Orbits: Minimal maxillary mural thickening without air-fluid levels. Remaining paranasal sinuses and mastoids are predominantly clear. Left ocular drainage device is noted, stable since 2009 CT. Orbits are otherwise unremarkable. Other: None IMPRESSION: 1. No acute intracranial abnormality. 2. Stable parenchymal volume loss and chronic microvascular angiopathy. Electronically Signed   By: Kreg Shropshire M.D.   On: 09/22/2019 14:33   MR ANGIO HEAD WO  CONTRAST  Result Date: 09/22/2019 CLINICAL DATA:  TIA.  Aphasia. EXAM: MRI HEAD WITHOUT CONTRAST MRA HEAD WITHOUT CONTRAST TECHNIQUE: Multiplanar, multiecho pulse sequences of the brain  and surrounding structures were obtained without intravenous contrast. Angiographic images of the head were obtained using MRA technique without contrast. COMPARISON:  CT head 09/22/2019 FINDINGS: MRI HEAD FINDINGS Brain: Ventricle size and cerebral volume normal. Negative for acute infarct. Scattered small white matter hyperintensities bilaterally. Hyperintensity in the pons bilaterally. Negative for hemorrhage or mass. Vascular: Normal arterial flow voids. Skull and upper cervical spine: No focal skeletal lesion. Cervical spondylosis. Sinuses/Orbits: Mild mucosal edema paranasal sinuses. Bilateral cataract surgery Other: None MRA HEAD FINDINGS Both vertebral arteries patent to the basilar. PICA patent bilaterally. Basilar widely patent. Basilar ends in the superior cerebellar arteries which are patent bilaterally. Fetal origin of the posterior cerebral artery bilaterally without stenosis Internal carotid artery patent bilaterally. Anterior and middle cerebral arteries patent without stenosis or large vessel occlusion. Negative for cerebral aneurysm. IMPRESSION: No acute intracranial abnormality. Mild chronic microvascular ischemic change in the white matter and pons Negative MRA head Electronically Signed   By: Marlan Palau M.D.   On: 09/22/2019 19:04   MR BRAIN WO CONTRAST  Result Date: 09/22/2019 CLINICAL DATA:  TIA.  Aphasia. EXAM: MRI HEAD WITHOUT CONTRAST MRA HEAD WITHOUT CONTRAST TECHNIQUE: Multiplanar, multiecho pulse sequences of the brain and surrounding structures were obtained without intravenous contrast. Angiographic images of the head were obtained using MRA technique without contrast. COMPARISON:  CT head 09/22/2019 FINDINGS: MRI HEAD FINDINGS Brain: Ventricle size and cerebral volume normal. Negative for  acute infarct. Scattered small white matter hyperintensities bilaterally. Hyperintensity in the pons bilaterally. Negative for hemorrhage or mass. Vascular: Normal arterial flow voids. Skull and upper cervical spine: No focal skeletal lesion. Cervical spondylosis. Sinuses/Orbits: Mild mucosal edema paranasal sinuses. Bilateral cataract surgery Other: None MRA HEAD FINDINGS Both vertebral arteries patent to the basilar. PICA patent bilaterally. Basilar widely patent. Basilar ends in the superior cerebellar arteries which are patent bilaterally. Fetal origin of the posterior cerebral artery bilaterally without stenosis Internal carotid artery patent bilaterally. Anterior and middle cerebral arteries patent without stenosis or large vessel occlusion. Negative for cerebral aneurysm. IMPRESSION: No acute intracranial abnormality. Mild chronic microvascular ischemic change in the white matter and pons Negative MRA head Electronically Signed   By: Marlan Palau M.D.   On: 09/22/2019 19:04    Procedures Procedures (including critical care time)  Medications Ordered in ED Medications   stroke: mapping our early stages of recovery book (has no administration in time range)  acetaminophen (TYLENOL) tablet 650 mg (has no administration in time range)    Or  acetaminophen (TYLENOL) 160 MG/5ML solution 650 mg (has no administration in time range)    Or  acetaminophen (TYLENOL) suppository 650 mg (has no administration in time range)  enoxaparin (LOVENOX) injection 40 mg (40 mg Subcutaneous Given 09/22/19 2132)  aspirin EC tablet 81 mg (81 mg Oral Given 09/22/19 2133)  atorvastatin (LIPITOR) tablet 80 mg (has no administration in time range)    ED Course  I have reviewed the triage vital signs and the nursing notes.  Pertinent labs & imaging results that were available during my care of the patient were reviewed by me and considered in my medical decision making (see chart for details).    MDM  Rules/Calculators/A&P  On arrival, pt is afebrile, HDS.  Considered: TIA/CVA, complicated migraine, anxiety/stress this patient reports she has been undergoing added stress with her family since her father was diagnosed with COVID-19; no current gross neuro deficits. Of note, abdomen is soft, benign, nonfocal and nontender with no change from chronic abdominal  discomfort that patient is being worked up as an outpatient.  Low suspicion for acute surgical intervention.  Do not feel imaging is necessary at this time.  CT head: no acute findings  Overall, concern for TIA.  Patient is currently not on daily aspirin/Plavix or a statin.  Discussed with neurology who recommends admission for further TIA work-up and management.  Neurology to see patient.  MRI brain ordered. Patient amenable to inpatient work-up.  MRI brain without evidence of acute abnormality. Hospitalist contacted for admission. They have assumed care of patient at this time.  No further acute events.  Final Clinical Impression(s) / ED Diagnoses Final diagnoses:  TIA (transient ischemic attack)    Rx / DC Orders ED Discharge Orders    None       Norton PastelKerby, Surie Suchocki, MD 09/22/19 [id number]    Milagros Lollykstra, Richard S, MD 09/23/19 1539

## 2019-09-22 NOTE — H&P (Addendum)
History and Physical    Wendy Downs:[id number] DOB: [birth year] DOA: 09/22/2019  PCP: Merri Brunette, MD  Patient coming from: Home  I have personally briefly reviewed patient's old medical records in [facility]  Chief Complaint: Slurred speech  HPI: Wendy Downs is a 68 y.o. female with medical history significant of hypertension, hyperlipidemia, diet-controlled diabetes mellitus, sarcoidosis, morbid obesity, gout presents to emergency department due to sudden onset of slurred speech while she was talking over the phone with her brother yesterday at 3 PM.  She tells me that she experienced 5 minutes of having word finding difficulty.  Her symptoms has resolved now and she is back to her baseline however her PCP advised her to go to the ED for TIA/stroke work-up.  Patient reports mild headache as she have not eaten anything since this morning.  She denies history of similar symptoms in the past, blurry vision, facial droop, lightheadedness, dizziness, head trauma, loss of consciousness, chest pain, shortness of breath, palpitation, leg swelling, nausea, vomiting, diarrhea, fever, chills, decreased appetite, urinary or bowel changes.  She denies history of smoking, alcohol, illicit drug use.  ED Course: Upon arrival to ED: Patient vital signs stable, initial work-up such as CBC, CT head without contrast came back negative.  CMP shows AKI on CKD.  EDP consulted neurology for further evaluation and management.  Review of Systems: As per HPI otherwise negative.    Past Medical History:  Diagnosis Date  . Diabetes mellitus without complication (HCC)   . Gout   . Hyperlipidemia   . Hypertension   . Morbid obesity (HCC)   . Shingles     Past Surgical History:  Procedure Laterality Date  . APPENDECTOMY    . CARPAL TUNNEL RELEASE    . CHOLECYSTECTOMY       reports that she has never smoked. She has never used smokeless tobacco. She reports that she does not drink  alcohol or use drugs.  Allergies  Allergen Reactions  . Clonidine Derivatives Other (See Comments)    Ear Pain, Dizziness, Insomnia, migraine, constipation. Patient stated that medicine kept her off balance.  . Prednisone Other (See Comments)    Mood swings  . Valtrex [Valacyclovir Hcl] Other (See Comments)    Doesn't remember   . Vaseretic [Enalapril-Hydrochlorothiazide] Other (See Comments)    Gout     Family History  Problem Relation Age of Onset  . Heart attack Mother   . Hypertension Mother   . Stroke Father   . Heart disease Sister   . Stroke Sister   . Heart disease Brother   . Stroke Brother   . Stroke Sister   . Aneurysm Brother     Prior to Admission medications   Medication Sig Start Date End Date Taking? Authorizing Provider  albuterol (PROVENTIL HFA;VENTOLIN HFA) 108 (90 Base) MCG/ACT inhaler TAKE 2 PUFFS BY MOUTH EVERY 4 HOURS AS NEEDED 01/27/18   Nyoka Cowden, MD  allopurinol (ZYLOPRIM) 100 MG tablet Take 100 mg by mouth 2 (two) times daily.    [provider]  B Complex Vitamins (VITAMIN B COMPLEX IJ) Inject 1 mL as directed every 30 (thirty) days. Vitamin b12 injection    [provider]  brimonidine (ALPHAGAN P) 0.1 % SOLN Place 1 drop into both eyes 3 (three) times daily.     [provider]  BYSTOLIC 20 MG TABS TAKE 1 TABLET (20 MG TOTAL) BY MOUTH DAILY. 01/27/17   Jake Bathe, MD  cetirizine (ZYRTEC) 10 MG tablet Take 10 mg by mouth daily as needed for allergies.    [provider]  furosemide (LASIX) 20 MG tablet Take 20 mg by mouth daily.     [provider]  latanoprost (XALATAN) 0.005 % ophthalmic solution Place 1 drop into the right eye at bedtime.     [provider]  Multiple Vitamins-Minerals (MEGA MULTI WOMEN PO) Take by mouth.    [provider]  prednisoLONE acetate (PRED FORTE) 1 % ophthalmic suspension Apply 1 drop to eye 3 (three) times daily. 09/12/18   [provider]    telmisartan (MICARDIS) 80 MG tablet Take 1 tablet by mouth daily. 01/19/18   [provider]  timolol (TIMOPTIC) 0.5 % ophthalmic solution Place 2 drops into both eyes 2 (two) times daily. 12/28/17   [provider]  TURMERIC CURCUMIN PO Take 900 mg by mouth daily.    [provider]    Physical Exam: Vitals:   09/22/19 1238  BP: (!) 138/114  Pulse: 71  Resp: 20  Temp: 98.3 F (36.8 C)  TempSrc: Oral  SpO2: 100%    Constitutional: NAD, calm, comfortable, communicating well Eyes: PERRL, lids and conjunctivae normal ENMT: Mucous membranes are moist. Posterior pharynx clear of any exudate or lesions.Normal dentition.  Neck: normal, supple, no masses, no thyromegaly Respiratory: clear to auscultation bilaterally, no wheezing, no crackles. Normal respiratory effort. No accessory muscle use.  Cardiovascular: Regular rate and rhythm, no murmurs / rubs / gallops. No extremity edema. 2+ pedal pulses. No carotid bruits.  Abdomen: no tenderness, no masses palpated. No hepatosplenomegaly. Bowel sounds positive.  Musculoskeletal: no clubbing / cyanosis. No joint deformity upper and lower extremities. Good ROM, no contractures. Normal muscle tone.  Skin: no rashes, lesions, ulcers. No induration Neurologic: CN 2-12 grossly intact.  No facial droop, no slurred speech, sensation intact, DTR normal. Strength 5/5 in all 4.  Psychiatric: Normal judgment and insight. Alert and oriented x 3. Normal mood.    Labs on Admission: I have personally reviewed following labs and imaging studies  CBC: Recent Labs  Lab 09/22/19 1305  WBC 5.9  NEUTROABS 2.8  HGB 12.9  HCT 39.0  MCV 93.3  PLT 227   Basic Metabolic Panel: Recent Labs  Lab 09/22/19 1305  NA 140  K 4.0  CL 103  CO2 27  GLUCOSE 97  BUN 15  CREATININE 1.29*  CALCIUM 9.2   GFR: CrCl cannot be calculated (Unknown ideal weight.). Liver Function Tests: Recent Labs  Lab 09/22/19 1305  AST 17  ALT 13   ALKPHOS 51  BILITOT 1.5*  PROT 6.8  ALBUMIN 3.8   No results for input(s): LIPASE, AMYLASE in the last 168 hours. No results for input(s): AMMONIA in the last 168 hours. Coagulation Profile: Recent Labs  Lab 09/22/19 1305  INR 1.0   Cardiac Enzymes: No results for input(s): CKTOTAL, CKMB, CKMBINDEX, TROPONINI in the last 168 hours. BNP (last 3 results) No results for input(s): PROBNP in the last 8760 hours. HbA1C: No results for input(s): HGBA1C in the last 72 hours. CBG: No results for input(s): GLUCAP in the last 168 hours. Lipid Profile: No results for input(s): CHOL, HDL, LDLCALC, TRIG, CHOLHDL, LDLDIRECT in the last 72 hours. Thyroid Function Tests: No results for input(s): TSH, T4TOTAL, FREET4, T3FREE, THYROIDAB in the last 72 hours. Anemia Panel: No results for input(s): VITAMINB12, FOLATE, FERRITIN, TIBC, IRON, RETICCTPCT in the last 72 hours. Urine analysis:    Component  Value Date/Time   COLORURINE YELLOW 02/08/2008 2009   APPEARANCEUR CLEAR 02/08/2008 2009   LABSPEC 1.011 02/08/2008 2009   PHURINE 6.0 02/08/2008 2009   GLUCOSEU NEGATIVE 02/08/2008 2009   HGBUR NEGATIVE 02/08/2008 2009   BILIRUBINUR NEGATIVE 02/08/2008 2009   KETONESUR NEGATIVE 02/08/2008 2009   PROTEINUR NEGATIVE 02/08/2008 2009   UROBILINOGEN 0.2 02/08/2008 2009   NITRITE NEGATIVE 02/08/2008 2009   LEUKOCYTESUR  02/08/2008 2009    NEGATIVE MICROSCOPIC NOT DONE ON URINES WITH NEGATIVE PROTEIN, BLOOD, LEUKOCYTES, NITRITE, OR GLUCOSE <1000 mg/dL.    Radiological Exams on Admission: CT HEAD WO CONTRAST  Result Date: 09/22/2019 CLINICAL DATA:  Multiple episodes of expressive aphasia over the past 2 days, since resolved EXAM: CT HEAD WITHOUT CONTRAST TECHNIQUE: Contiguous axial images were obtained from the base of the skull through the vertex without intravenous contrast. COMPARISON:  CT head 02/08/2008, MR brain 05/23/2016 (images only), 08/23/2014 FINDINGS: Brain: No evidence of acute  infarction, hemorrhage, hydrocephalus, extra-axial collection or mass lesion/mass effect. Symmetric prominence of the ventricles, cisterns and sulci compatible with parenchymal volume loss. Patchy areas of white matter hypoattenuation are most compatible with chronic microvascular angiopathy. Vascular: Atherosclerotic calcification of the carotid siphons. No hyperdense vessel. Skull: No calvarial fracture or suspicious osseous lesion. No scalp swelling or hematoma. Sinuses/Orbits: Minimal maxillary mural thickening without air-fluid levels. Remaining paranasal sinuses and mastoids are predominantly clear. Left ocular drainage device is noted, stable since 2009 CT. Orbits are otherwise unremarkable. Other: None IMPRESSION: 1. No acute intracranial abnormality. 2. Stable parenchymal volume loss and chronic microvascular angiopathy. Electronically Signed   By: Kreg ShropshirePrice  DeHay M.D.   On: 09/22/2019 14:33   MR ANGIO HEAD WO CONTRAST  Result Date: 09/22/2019 CLINICAL DATA:  TIA.  Aphasia. EXAM: MRI HEAD WITHOUT CONTRAST MRA HEAD WITHOUT CONTRAST TECHNIQUE: Multiplanar, multiecho pulse sequences of the brain and surrounding structures were obtained without intravenous contrast. Angiographic images of the head were obtained using MRA technique without contrast. COMPARISON:  CT head 09/22/2019 FINDINGS: MRI HEAD FINDINGS Brain: Ventricle size and cerebral volume normal. Negative for acute infarct. Scattered small white matter hyperintensities bilaterally. Hyperintensity in the pons bilaterally. Negative for hemorrhage or mass. Vascular: Normal arterial flow voids. Skull and upper cervical spine: No focal skeletal lesion. Cervical spondylosis. Sinuses/Orbits: Mild mucosal edema paranasal sinuses. Bilateral cataract surgery Other: None MRA HEAD FINDINGS Both vertebral arteries patent to the basilar. PICA patent bilaterally. Basilar widely patent. Basilar ends in the superior cerebellar arteries which are patent bilaterally.  Fetal origin of the posterior cerebral artery bilaterally without stenosis Internal carotid artery patent bilaterally. Anterior and middle cerebral arteries patent without stenosis or large vessel occlusion. Negative for cerebral aneurysm. IMPRESSION: No acute intracranial abnormality. Mild chronic microvascular ischemic change in the white matter and pons Negative MRA head Electronically Signed   By: Marlan Palauharles  Clark M.D.   On: 09/22/2019 19:04   MR BRAIN WO CONTRAST  Result Date: 09/22/2019 CLINICAL DATA:  TIA.  Aphasia. EXAM: MRI HEAD WITHOUT CONTRAST MRA HEAD WITHOUT CONTRAST TECHNIQUE: Multiplanar, multiecho pulse sequences of the brain and surrounding structures were obtained without intravenous contrast. Angiographic images of the head were obtained using MRA technique without contrast. COMPARISON:  CT head 09/22/2019 FINDINGS: MRI HEAD FINDINGS Brain: Ventricle size and cerebral volume normal. Negative for acute infarct. Scattered small white matter hyperintensities bilaterally. Hyperintensity in the pons bilaterally. Negative for hemorrhage or mass. Vascular: Normal arterial flow voids. Skull and upper cervical spine: No focal skeletal lesion. Cervical spondylosis. Sinuses/Orbits: Mild mucosal  edema paranasal sinuses. Bilateral cataract surgery Other: None MRA HEAD FINDINGS Both vertebral arteries patent to the basilar. PICA patent bilaterally. Basilar widely patent. Basilar ends in the superior cerebellar arteries which are patent bilaterally. Fetal origin of the posterior cerebral artery bilaterally without stenosis Internal carotid artery patent bilaterally. Anterior and middle cerebral arteries patent without stenosis or large vessel occlusion. Negative for cerebral aneurysm. IMPRESSION: No acute intracranial abnormality. Mild chronic microvascular ischemic change in the white matter and pons Negative MRA head Electronically Signed   By: Franchot Gallo M.D.   On: 09/22/2019 19:04     Assessment/Plan Principal Problem:   TIA (transient ischemic attack) Active Problems:   PULMONARY SARCOIDOSIS   Morbid obesity due to excess calories ([place])   Hypertension   Hyperlipidemia   Gout   Diabetes mellitus without complication (HCC)   Acute on chronic kidney failure ([place])    TIA: -Patient had a brief episode of slurred speech yesterday which lasted for about 5 minutes.  Her symptoms resolved and she is back to her baseline. -CT head without contrast came back negative. -Admit patient under observation on telemetry. -EDP consulted neurology-await recommendations. -MRI brain without contrast and MRA of head  came back negative for acute findings. -Carotid ultrasound is ordered and is pending. -Ordered echo, A1c, lipid panel. -We will start on aspirin, statin -Allow for permissive hypertension for the first 24-48h - only treat PRN if SBP >332 mmHg or diastolic blood pressure >951. Blood pressures can be gradually normalized to SBP<140 upon discharge. -Frequent neuro checks -We will keep her n.p.o. until she passes the bedside swallow evaluation. -PT/OT eval, Speech consult  AKI on CKD stage IIIa: -Baseline creatinine is 1.18 and GFR is 55 -Today creatinine is 1.29 and GFR 49. -Hold nephrotoxic medication-such as ARB -Monitor kidney function closely  Hypertension: Blood pressure is stable -Hold home antihypertensive medication for now to allow permissive hypertension.  Monitor blood pressure closely.  Diet-controlled diabetes mellitus: Check A1c -She does not take any medications/insulin at home.  Hyperlipidemia: Check lipid panel -Continue statin  Gout: Stable -Continue allopurinol  Pulmonary sarcoidosis: Stable -Maintaining oxygen saturation on room air, no wheezing noted on exam. -We will continue home inhalers.  Unable to start patient's home medication as pharmacy reconciliation is pending.  DVT prophylaxis: TED/SCD/Lovenox  code Status: Full  code Family Communication: None present at bedside.  Plan of care discussed with patient in length and she verbalized understanding and agreed with it. Disposition Plan: Likely home tomorrow Consults called: Neurology by EDP Admission status: Observation   Mckinley Jewel MD [facility] Pager [phone]  If 7PM-7AM, please contact night-coverage [email or web address] Password [facility]  09/22/2019, 7:55 PM

## 2019-09-22 NOTE — ED Triage Notes (Signed)
Patient states yesterday while on the phone at about 3:15 she had some aphasia intermittently. Patient has no neuro deficits noted at this time. Alert and orientated x4. Equal grip strength and equal sensation. 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 Patient also adds some abdominal discomfort that has been going on since July- currently taking omeprazole and Pepcid.

## 2019-09-23 ENCOUNTER — Observation Stay (HOSPITAL_BASED_OUTPATIENT_CLINIC_OR_DEPARTMENT_OTHER): Payer: 59

## 2019-09-23 DIAGNOSIS — E119 Type 2 diabetes mellitus without complications: Secondary | ICD-10-CM | POA: Diagnosis not present

## 2019-09-23 DIAGNOSIS — G459 Transient cerebral ischemic attack, unspecified: Secondary | ICD-10-CM

## 2019-09-23 LAB — ECHOCARDIOGRAM COMPLETE
Height: 64 in
Weight: 3232 [oz_av]

## 2019-09-23 LAB — HEMOGLOBIN A1C
Hgb A1c MFr Bld: 6.3 % — ABNORMAL HIGH (ref 4.8–5.6)
Mean Plasma Glucose: 134.11 mg/dL

## 2019-09-23 LAB — LIPID PANEL
Cholesterol: 164 mg/dL (ref 0–200)
HDL: 34 mg/dL — ABNORMAL LOW (ref >40)
LDL Cholesterol: 108 mg/dL — ABNORMAL HIGH (ref 0–99)
Total CHOL/HDL Ratio: 4.8 ratio
Triglycerides: 110 mg/dL (ref <150)
VLDL: 22 mg/dL (ref 0–40)

## 2019-09-23 LAB — GLUCOSE, CAPILLARY: Glucose-Capillary: 86 mg/dL (ref 70–99)

## 2019-09-23 LAB — SARS CORONAVIRUS 2 (TAT 6-24 HRS): SARS Coronavirus 2: NEGATIVE

## 2019-09-23 MED ORDER — CLOPIDOGREL BISULFATE 75 MG PO TABS: 75.0000 mg | ORAL_TABLET | Freq: Every day | ORAL | 0 refills | Status: AC

## 2019-09-23 MED ORDER — PERFLUTREN LIPID MICROSPHERE
1.0000 mL | INTRAVENOUS | Status: AC | PRN
  Administered 2019-09-23: 3 mL via INTRAVENOUS
  Filled 2019-09-23: qty 10

## 2019-09-23 MED ORDER — PRAVASTATIN SODIUM 20 MG PO TABS: 20.0000 mg | ORAL_TABLET | Freq: Every day | ORAL | 0 refills | Status: DC

## 2019-09-23 MED ORDER — ASPIRIN 81 MG PO TBEC: 81.0000 mg | DELAYED_RELEASE_TABLET | Freq: Every day | ORAL | 10 refills | Status: AC

## 2019-09-23 MED ORDER — PRAVASTATIN SODIUM 10 MG PO TABS: 20.0000 mg | ORAL_TABLET | Freq: Every day | ORAL | Status: DC

## 2019-09-23 MED ORDER — ATORVASTATIN CALCIUM 40 MG PO TABS: 40.0000 mg | ORAL_TABLET | Freq: Every day | ORAL | Status: DC

## 2019-09-23 MED ORDER — CLOPIDOGREL BISULFATE 75 MG PO TABS
75.0000 mg | ORAL_TABLET | Freq: Every day | ORAL | Status: DC
  Administered 2019-09-23: 75 mg via ORAL
  Filled 2019-09-23: qty 1

## 2019-09-23 NOTE — Procedures (Signed)
Echo attempted. Patient with vascular ultrasound. Will attempt again later.

## 2019-09-23 NOTE — Progress Notes (Signed)
Carotid duplex complete.  Please see CV Proc tab for preliminary results. [place], RVT 10:53 AM  09/23/2019

## 2019-09-23 NOTE — Discharge Summary (Signed)
Physician Discharge Summary  Wendy Downs:[id number] DOB: 1951-10-06 DOA: 09/22/2019  PCP: Merri Brunette, MD  Admit date: 09/22/2019 Discharge date: 09/23/2019  Time spent: 35 minutes  Recommendations for Outpatient Follow-up:  PCP in 1 week, continue aspirin Plavix for 3 weeks followed by aspirin alone Neurology in 1 month, 30day monitor  Discharge Diagnoses:  Principal Problem:   TIA (transient ischemic attack) Active Problems:   PULMONARY SARCOIDOSIS   Morbid obesity   Hypertension   Hyperlipidemia   Gout   Diabetes mellitus without complication (HCC) Chronic kidney disease stage III   Discharge Condition: Stable  Diet recommendation: Diabetic  Filed Weights   09/22/19 2021 09/23/19 0602  Weight: 91.6 kg 91.6 kg    History of present illness:  Wendy Downs is a 68 y.o. female with medical history significant of hypertension, hyperlipidemia, diet-controlled diabetes mellitus, sarcoidosis, morbid obesity, gout presents to emergency department due to sudden onset of slurred speech while she was talking over the phone with her brother 12/10 at 3 PM.  She tells me that she experienced 5 minutes of having word finding difficulty.  Her symptoms has resolved now and she is back to her baseline however her PCP advised her to go to the ED for TIA/stroke work-up  Hospital Course:   Suspected TIA versus migraine -Patient had a transient episode of slurred speech yesterday with this lasted for about 5 minutes, her symptoms resolved and back to baseline -MRI negative for acute infarct -Neurology consulted who felt this could be TIA versus migraine -2D echocardiogram and carotid duplex were unremarkable -Hemoglobin A1c is 6.3, LDL was 109, patient has poor tolerance to statins in the past with severe myopathy hence she was started on pravastatin by neurology today for discharge -She is not on antiplatelet agents at baseline, Dr.Xu  recommended aspirin and Plavix for 21  days followed by aspirin alone -PT OT evaluation completed, she does have mild balance deficits at baseline, home health PT recommended and set up at discharge -Neurology will also arrange 30-day monitor after discharge  Rest of her chronic medical problems namely CKD stage 3a, hypertension, diet-controlled diabetes, gout, pulmonary sarcoidosis all remained stable  Discharge Exam: Vitals:   09/23/19 0745 09/23/19 1202  BP: 124/62 (!) 153/102  Pulse: 64 62  Resp: 16 16  Temp: 97.9 F (36.6 C) 98.1 F (36.7 C)  SpO2: 98% 98%    General: AAOx3 Cardiovascular: S1S2/RRR Respiratory: CTAB  Discharge Instructions   Discharge Instructions    Diet - low sodium heart healthy   Complete by: As directed    Increase activity slowly   Complete by: As directed      Allergies as of 09/23/2019      Reactions   Clonidine Derivatives Other (See Comments)   Ear Pain, Dizziness, Insomnia, migraine, constipation. Patient stated that medicine kept her off balance.   Prednisone Other (See Comments)   Mood swings   Valtrex [valacyclovir Hcl] Other (See Comments)   Maybe stomach pains   Vaseretic [enalapril-hydrochlorothiazide] Other (See Comments)   Gout       Medication List    STOP taking these medications   ibuprofen 200 MG tablet Commonly known as: ADVIL     TAKE these medications   albuterol 108 (90 Base) MCG/ACT inhaler Commonly known as: VENTOLIN HFA TAKE 2 PUFFS BY MOUTH EVERY 4 HOURS AS NEEDED What changed: See the new instructions.   allopurinol 100 MG tablet Commonly known as: ZYLOPRIM Take 200 mg by mouth daily.  aspirin 81 MG EC tablet Take 1 tablet (81 mg total) by mouth daily. Start taking on: September 24, 2019   brimonidine 0.1 % Soln Commonly known as: ALPHAGAN P Place 1 drop into the left eye 3 (three) times daily.   Bystolic 20 MG Tabs Generic drug: Nebivolol HCl TAKE 1 TABLET (20 MG TOTAL) BY MOUTH DAILY. What changed: when to take this    cetirizine 10 MG tablet Commonly known as: ZYRTEC Take 10 mg by mouth daily as needed for allergies.   clopidogrel 75 MG tablet Commonly known as: PLAVIX Take 1 tablet (75 mg total) by mouth daily for 21 days. Start taking on: September 24, 2019   cyanocobalamin 1000 MCG/ML injection Commonly known as: (VITAMIN B-12) Inject 1,000 mcg into the muscle every 30 (thirty) days.   famotidine 10 MG tablet Commonly known as: PEPCID Take 20 mg by mouth at bedtime.   furosemide 20 MG tablet Commonly known as: LASIX Take 20 mg by mouth daily.   latanoprost 0.005 % ophthalmic solution Commonly known as: XALATAN Place 1 drop into the right eye at bedtime.   omeprazole 40 MG capsule Commonly known as: PRILOSEC Take 40 mg by mouth daily.   pravastatin 20 MG tablet Commonly known as: PRAVACHOL Take 1 tablet (20 mg total) by mouth daily at 6 PM.   prednisoLONE acetate 1 % ophthalmic suspension Commonly known as: PRED FORTE Place 1 drop into the left eye 3 (three) times daily.   telmisartan 80 MG tablet Commonly known as: MICARDIS Take 80 mg by mouth daily.   timolol 0.5 % ophthalmic solution Commonly known as: TIMOPTIC Place 1 drop into both eyes 2 (two) times daily.   TURMERIC CURCUMIN PO Take 900 mg by mouth daily.      Allergies  Allergen Reactions  . Clonidine Derivatives Other (See Comments)    Ear Pain, Dizziness, Insomnia, migraine, constipation. Patient stated that medicine kept her off balance.  . Prednisone Other (See Comments)    Mood swings  . Valtrex [Valacyclovir Hcl] Other (See Comments)    Maybe stomach pains  . Vaseretic [Enalapril-Hydrochlorothiazide] Other (See Comments)    Gout    Follow-up Information    Carol Ada, MD. Schedule an appointment as soon as possible for a visit in 1 week(s).   Specialty: Family Medicine Contact information: [address] [place] [address] [phone]        Neurologist. Schedule an  appointment as soon as possible for a visit in 1 month(s).            The results of significant diagnostics from this hospitalization (including imaging, microbiology, ancillary and laboratory) are listed below for reference.    Significant Diagnostic Studies: CT HEAD WO CONTRAST  Result Date: 09/22/2019 CLINICAL DATA:  Multiple episodes of expressive aphasia over the past 2 days, since resolved EXAM: CT HEAD WITHOUT CONTRAST TECHNIQUE: Contiguous axial images were obtained from the base of the skull through the vertex without intravenous contrast. COMPARISON:  CT head 02/08/2008, MR brain 05/23/2016 (images only), 08/23/2014 FINDINGS: Brain: No evidence of acute infarction, hemorrhage, hydrocephalus, extra-axial collection or mass lesion/mass effect. Symmetric prominence of the ventricles, cisterns and sulci compatible with parenchymal volume loss. Patchy areas of white matter hypoattenuation are most compatible with chronic microvascular angiopathy. Vascular: Atherosclerotic calcification of the carotid siphons. No hyperdense vessel. Skull: No calvarial fracture or suspicious osseous lesion. No scalp swelling or hematoma. Sinuses/Orbits: Minimal maxillary mural thickening without air-fluid levels. Remaining paranasal sinuses  and mastoids are predominantly clear. Left ocular drainage device is noted, stable since 2009 CT. Orbits are otherwise unremarkable. Other: None IMPRESSION: 1. No acute intracranial abnormality. 2. Stable parenchymal volume loss and chronic microvascular angiopathy. Electronically Signed   By: Kreg Shropshire M.D.   On: 09/22/2019 14:33   MR ANGIO HEAD WO CONTRAST  Result Date: 09/22/2019 CLINICAL DATA:  TIA.  Aphasia. EXAM: MRI HEAD WITHOUT CONTRAST MRA HEAD WITHOUT CONTRAST TECHNIQUE: Multiplanar, multiecho pulse sequences of the brain and surrounding structures were obtained without intravenous contrast. Angiographic images of the head were obtained using MRA technique  without contrast. COMPARISON:  CT head 09/22/2019 FINDINGS: MRI HEAD FINDINGS Brain: Ventricle size and cerebral volume normal. Negative for acute infarct. Scattered small white matter hyperintensities bilaterally. Hyperintensity in the pons bilaterally. Negative for hemorrhage or mass. Vascular: Normal arterial flow voids. Skull and upper cervical spine: No focal skeletal lesion. Cervical spondylosis. Sinuses/Orbits: Mild mucosal edema paranasal sinuses. Bilateral cataract surgery Other: None MRA HEAD FINDINGS Both vertebral arteries patent to the basilar. PICA patent bilaterally. Basilar widely patent. Basilar ends in the superior cerebellar arteries which are patent bilaterally. Fetal origin of the posterior cerebral artery bilaterally without stenosis Internal carotid artery patent bilaterally. Anterior and middle cerebral arteries patent without stenosis or large vessel occlusion. Negative for cerebral aneurysm. IMPRESSION: No acute intracranial abnormality. Mild chronic microvascular ischemic change in the white matter and pons Negative MRA head Electronically Signed   By: Marlan Palau M.D.   On: 09/22/2019 19:04   MR BRAIN WO CONTRAST  Result Date: 09/22/2019 CLINICAL DATA:  TIA.  Aphasia. EXAM: MRI HEAD WITHOUT CONTRAST MRA HEAD WITHOUT CONTRAST TECHNIQUE: Multiplanar, multiecho pulse sequences of the brain and surrounding structures were obtained without intravenous contrast. Angiographic images of the head were obtained using MRA technique without contrast. COMPARISON:  CT head 09/22/2019 FINDINGS: MRI HEAD FINDINGS Brain: Ventricle size and cerebral volume normal. Negative for acute infarct. Scattered small white matter hyperintensities bilaterally. Hyperintensity in the pons bilaterally. Negative for hemorrhage or mass. Vascular: Normal arterial flow voids. Skull and upper cervical spine: No focal skeletal lesion. Cervical spondylosis. Sinuses/Orbits: Mild mucosal edema paranasal sinuses.  Bilateral cataract surgery Other: None MRA HEAD FINDINGS Both vertebral arteries patent to the basilar. PICA patent bilaterally. Basilar widely patent. Basilar ends in the superior cerebellar arteries which are patent bilaterally. Fetal origin of the posterior cerebral artery bilaterally without stenosis Internal carotid artery patent bilaterally. Anterior and middle cerebral arteries patent without stenosis or large vessel occlusion. Negative for cerebral aneurysm. IMPRESSION: No acute intracranial abnormality. Mild chronic microvascular ischemic change in the white matter and pons Negative MRA head Electronically Signed   By: Marlan Palau M.D.   On: 09/22/2019 19:04   ECHOCARDIOGRAM COMPLETE  Result Date: 09/23/2019   ECHOCARDIOGRAM REPORT   Patient Name:   Wendy Downs Date of Exam: 09/23/2019 Medical Rec #:  [id number]         Height:       64.0 in Accession #:    [id number]        Weight:       202.0 lb Date of Birth:  1951-04-15         BSA:          1.96 m Patient Age:    68 years          BP:           124/62 mmHg Patient Gender: F  HR:           64 bpm. Exam Location:  Inpatient Procedure: 2D Echo and Intracardiac Opacification Agent Indications:    TIA 435.9/G45.9  History:        Patient has prior history of Echocardiogram examinations, most                 recent 02/25/2017. Risk Factors:Hypertension, Diabetes and                 Dyslipidemia. Sarcoidosis.  Sonographer:    Ross LudwigArthur Guy RDCS (AE) Referring Phys: [id number] Kasandra KnudsenRINKA R [facility] LL[facility]HWANI IMPRESSIONS  1. Left ventricular ejection fraction, by visual estimation, is 55 to 60%. The left ventricle has normal function. There is mildly increased left ventricular hypertrophy.  2. Definity contrast agent was given IV to delineate the left ventricular endocardial borders.  3. Left ventricular diastolic parameters are consistent with Grade I diastolic dysfunction (impaired relaxation).  4. The left ventricle has no regional wall motion  abnormalities.  5. Global right ventricle has normal systolic function.The right ventricular size is normal. No increase in right ventricular wall thickness.  6. Left atrial size was mildly dilated.  7. Right atrial size was normal.  8. The mitral valve is normal in structure. Trivial mitral valve regurgitation. No evidence of mitral stenosis.  9. The tricuspid valve is normal in structure. Tricuspid valve regurgitation is trivial. 10. The aortic valve has an indeterminant number of cusps. Aortic valve regurgitation is trivial. No evidence of aortic valve sclerosis or stenosis. 11. The pulmonic valve was not well visualized. Pulmonic valve regurgitation is not visualized. 12. Mildly elevated pulmonary artery systolic pressure. 13. The inferior vena cava is normal in size with greater than 50% respiratory variability, suggesting right atrial pressure of 3 mmHg. 14. The interatrial septum was not well visualized. FINDINGS  Left Ventricle: Left ventricular ejection fraction, by visual estimation, is 55 to 60%. The left ventricle has normal function. Definity contrast agent was given IV to delineate the left ventricular endocardial borders. The left ventricle has no regional wall motion abnormalities. The left ventricular internal cavity size was the left ventricle is normal in size. There is mildly increased left ventricular hypertrophy. Left ventricular diastolic parameters are consistent with Grade I diastolic dysfunction (impaired relaxation). Normal left atrial pressure. Right Ventricle: The right ventricular size is normal. No increase in right ventricular wall thickness. Global RV systolic function is has normal systolic function. The tricuspid regurgitant velocity is 2.41 m/s, and with an assumed right atrial pressure  of 8 mmHg, the estimated right ventricular systolic pressure is mildly elevated at 31.2 mmHg. Left Atrium: Left atrial size was mildly dilated. Right Atrium: Right atrial size was normal in size  Pericardium: There is no evidence of pericardial effusion. Mitral Valve: The mitral valve is normal in structure. Trivial mitral valve regurgitation. No evidence of mitral valve stenosis by observation. MV peak gradient, 4.6 mmHg. Tricuspid Valve: The tricuspid valve is normal in structure. Tricuspid valve regurgitation is trivial. Aortic Valve: The aortic valve has an indeterminant number of cusps. . There is mild thickening and mild calcification of the aortic valve. Aortic valve regurgitation is trivial. Aortic regurgitation PHT measures 551 msec. The aortic valve is structurally normal, with no evidence of sclerosis or stenosis. Mild aortic valve annular calcification. There is mild thickening of the aortic valve. There is mild calcification of the aortic valve. Aortic valve mean gradient measures 5.0 mmHg. Aortic valve peak gradient measures 9.2 mmHg. Aortic valve area, by  VTI measures 1.65 cm. Pulmonic Valve: The pulmonic valve was not well visualized. Pulmonic valve regurgitation is not visualized. Pulmonic regurgitation is not visualized. No evidence of pulmonic stenosis. Aorta: The aortic root is normal in size and structure. Pulmonary Artery: Indeterminant PASP, inadequate TR jet.  Venous: The inferior vena cava is normal in size with greater than 50% respiratory variability, suggesting right atrial pressure of 3 mmHg. IAS/Shunts: The interatrial septum was not well visualized.  LEFT VENTRICLE PLAX 2D LVIDd:         3.80 cm  Diastology LVIDs:         2.50 cm  LV e' lateral:   8.05 cm/s LV PW:         1.10 cm  LV E/e' lateral: 8.8 LV IVS:        1.10 cm  LV e' medial:    4.79 cm/s LVOT diam:     2.00 cm  LV E/e' medial:  14.8 LV SV:         40 ml LV SV Index:   19.12 LVOT Area:     3.14 cm  RIGHT VENTRICLE             IVC RV Basal diam:  2.50 cm     IVC diam: 1.60 cm RV S prime:     10.60 cm/s TAPSE (M-mode): 2.6 cm LEFT ATRIUM             Index       RIGHT ATRIUM           Index LA diam:        3.90 cm  1.99 cm/m  RA Area:     13.90 cm LA Vol (A2C):   67.4 ml 34.31 ml/m RA Volume:   30.80 ml  15.68 ml/m LA Vol (A4C):   50.1 ml 25.50 ml/m LA Biplane Vol: 62.3 ml 31.71 ml/m  AORTIC VALVE AV Area (Vmax):    1.71 cm AV Area (Vmean):   1.58 cm AV Area (VTI):     1.65 cm AV Vmax:           152.00 cm/s AV Vmean:          104.000 cm/s AV VTI:            0.381 m AV Peak Grad:      9.2 mmHg AV Mean Grad:      5.0 mmHg LVOT Vmax:         82.90 cm/s LVOT Vmean:        52.400 cm/s LVOT VTI:          0.200 m LVOT/AV VTI ratio: 0.52 AI PHT:            551 msec  AORTA Ao Root diam: 2.50 cm Ao Asc diam:  3.10 cm MITRAL VALVE                        TRICUSPID VALVE MV Area (PHT): 3.12 cm             TR Peak grad:   23.2 mmHg MV Peak grad:  4.6 mmHg             TR Vmax:        250.00 cm/s MV Mean grad:  1.0 mmHg MV Vmax:       1.07 m/s             SHUNTS MV Vmean:      51.2 cm/s  Systemic VTI:  0.20 m MV VTI:        0.34 m               Systemic Diam: 2.00 cm MV PHT:        70.47 msec MV Decel Time: 243 msec MV E velocity: 71.10 cm/s 103 cm/s MV A velocity: 92.55 cm/s 70.3 cm/s MV E/A ratio:  0.77       1.5  Dina Rich MD Electronically signed by Dina Rich MD Signature Date/Time: 09/23/2019/11:56:12 AM    Final    VAS US CAROTID  Result Date: 09/23/2019 Carotid Arterial Duplex Study Indications: TIA. Performing Technologist: Levada Schilling RDMS, RVT  Examination Guidelines: A complete evaluation includes B-mode imaging, spectral Doppler, color Doppler, and power Doppler as needed of all accessible portions of each vessel. Bilateral testing is considered an integral part of a complete examination. Limited examinations for reoccurring indications may be performed as noted.  Right Carotid Findings: +----------+--------+--------+--------+------------------+--------+           PSV cm/sEDV cm/sStenosisPlaque DescriptionComments +----------+--------+--------+--------+------------------+--------+  CCA Prox  64      19                                         +----------+--------+--------+--------+------------------+--------+ CCA Distal68      24                                         +----------+--------+--------+--------+------------------+--------+ ICA Prox  64      20                                         +----------+--------+--------+--------+------------------+--------+ ICA Distal74      28                                         +----------+--------+--------+--------+------------------+--------+ ECA       85      13                                         +----------+--------+--------+--------+------------------+--------+ +----------+--------+-------+----------------+-------------------+           PSV cm/sEDV cmsDescribe        Arm Pressure (mmHG) +----------+--------+-------+----------------+-------------------+ [id number]            Multiphasic, WNL                    +----------+--------+-------+----------------+-------------------+ +---------+--------+--+--------+--+---------+ VertebralPSV cm/s44EDV cm/s15Antegrade +---------+--------+--+--------+--+---------+  Left Carotid Findings: +----------+--------+--------+--------+------------------+--------+           PSV cm/sEDV cm/sStenosisPlaque DescriptionComments +----------+--------+--------+--------+------------------+--------+ CCA Prox  96      26                                         +----------+--------+--------+--------+------------------+--------+ CCA Distal73      24                                         +----------+--------+--------+--------+------------------+--------+  ICA Prox  88      29                                         +----------+--------+--------+--------+------------------+--------+ ICA Mid   84      32                                         +----------+--------+--------+--------+------------------+--------+ ICA Distal91      30                                          +----------+--------+--------+--------+------------------+--------+ ECA       73      17                                         +----------+--------+--------+--------+------------------+--------+ +----------+--------+--------+----------------+-------------------+           PSV cm/sEDV cm/sDescribe        Arm Pressure (mmHG) +----------+--------+--------+----------------+-------------------+ Subclavian175             Multiphasic, WNL                    +----------+--------+--------+----------------+-------------------+ +---------+--------+--+--------+--+---------+ VertebralPSV cm/s57EDV cm/s23Antegrade +---------+--------+--+--------+--+---------+  Summary: Right Carotid: Velocities in the right ICA are consistent with a 1-39% stenosis. Left Carotid: Velocities in the left ICA are consistent with a 1-39% stenosis. Vertebrals: Bilateral vertebral arteries demonstrate antegrade flow. *See table(s) above for measurements and observations.     Preliminary     Microbiology: Recent Results (from the past 240 hour(s))  SARS CORONAVIRUS 2 (TAT 6-24 HRS) Nasopharyngeal Nasopharyngeal Swab     Status: None   Collection Time: 09/22/19  8:19 PM   Specimen: Nasopharyngeal Swab  Result Value Ref Range Status   SARS Coronavirus 2 NEGATIVE NEGATIVE Final    Comment: (NOTE) SARS-CoV-2 target nucleic acids are NOT DETECTED. The SARS-CoV-2 RNA is generally detectable in upper and lower respiratory specimens during the acute phase of infection. Negative results do not preclude SARS-CoV-2 infection, do not rule out co-infections with other pathogens, and should not be used as the sole basis for treatment or other patient management decisions. Negative results must be combined with clinical observations, patient history, and epidemiological information. The expected result is Negative. Fact Sheet for  Patients: HairSlick.no Fact Sheet for Healthcare Providers: quierodirigir.com This test is not yet approved or cleared by the [place] FDA and  has been authorized for detection and/or diagnosis of SARS-CoV-2 by FDA under an Emergency Use Authorization (EUA). This EUA will remain  in effect (meaning this test can be used) for the duration of the COVID-19 declaration under Section 56 4(b)(1) of the Act, 21 U.S.C. section 360bbb-3(b)(1), unless the authorization is terminated or revoked sooner. Performed at [facility], [address] [place], [place] [address]      Labs: Basic Metabolic Panel: Recent Labs  Lab 09/22/19 1305 09/22/19 2019  NA 140  --   K 4.0  --   CL 103  --   CO2 27  --   GLUCOSE 97  --   BUN 15  --  CREATININE 1.29* 1.24*  CALCIUM 9.2  --    Liver Function Tests: Recent Labs  Lab 09/22/19 1305  AST 17  ALT 13  ALKPHOS 51  BILITOT 1.5*  PROT 6.8  ALBUMIN 3.8   No results for input(s): LIPASE, AMYLASE in the last 168 hours. No results for input(s): AMMONIA in the last 168 hours. CBC: Recent Labs  Lab 09/22/19 1305 09/22/19 2019  WBC 5.9 5.1  NEUTROABS 2.8  --   HGB 12.9 13.3  HCT 39.0 40.4  MCV 93.3 93.7  PLT 227 264   Cardiac Enzymes: No results for input(s): CKTOTAL, CKMB, CKMBINDEX, TROPONINI in the last 168 hours. BNP: BNP (last 3 results) No results for input(s): BNP in the last 8760 hours.  ProBNP (last 3 results) No results for input(s): PROBNP in the last 8760 hours.  CBG: Recent Labs  Lab 09/23/19 0634  GLUCAP 86       Signed:  Zannie Cove MD.  [facility] 09/23/2019, 1:44 PM

## 2019-09-23 NOTE — TOC Transition Note (Signed)
Transition of Care [facility]) - CM/SW Discharge Note   Patient Details  Name: Wendy Downs MRN: [id number] Date of Birth: Mar 08, 1951  Transition of Care [facility]) CM/SW Contact:  Carles Collet, RN Phone Number: 09/23/2019, 2:24 PM   Clinical Narrative:    [name] w patient over the phone. She politely declined [place] services at this time. She states she does not need DME. No other DC needs identified.           Patient Goals and CMS Choice        Discharge Placement                       Discharge Plan and Services                          HH Arranged: Patient Refused [facility]          Social Determinants of Health (SDOH) Interventions     Readmission Risk Interventions No flowsheet data found.

## 2019-09-23 NOTE — Evaluation (Signed)
Physical Therapy Evaluation Patient Details Name: Wendy Downs MRN: [id number] DOB: 11-Apr-1951 Today's Date: 09/23/2019   History of Present Illness  This 68 y.o. female with medical history significant of hypertension, hyperlipidemia, diet-controlled diabetes mellitus, sarcoidosis, morbid obesity, gout presents to emergency department due to sudden onset of slurred speech while she was talking over the phone with her brother yesterday 12/10 at 3 PM. MRI results: No acute intracranial abnormality. Mild chronic microvascular  Clinical Impression  Pt was seen for evaluation and instruction for ROM to manage chronic stiffness of LE's which is currently impacting her control of gait.  Dr Broadus John was in and was able to receive requests for cane and HHPT to follow briefly for balance and ongoing rehab to joints.  Follow acutely for these needs and progress to HHPT when ready.      Follow Up Recommendations Home health PT;Supervision for mobility/OOB    Equipment Recommendations  Cane    Recommendations for Other Services       Precautions / Restrictions Precautions Precautions: Fall Precaution Comments: has chronic sensory changes of feet Restrictions Weight Bearing Restrictions: No      Mobility  Bed Mobility Overal bed mobility: Modified Independent             General bed mobility comments: HOB up  Transfers Overall transfer level: Modified independent               General transfer comment: uses bedrail to stand  Ambulation/Gait Ambulation/Gait assistance: Supervision Gait Distance (Feet): 100 Feet Assistive device: 1 person hand held assist Gait Pattern/deviations: Step-through pattern;Shuffle;Decreased stride length;Wide base of support Gait velocity: reduced Gait velocity interpretation: <1.31 ft/sec, indicative of household ambulator General Gait Details: ROM in hips and ankles impacts gait  Stairs Stairs: Yes Stairs assistance: Min guard Stair  Management: One rail Right;Alternating pattern;Forwards Number of Stairs: 12 General stair comments: hard footfalls in landing on lower step  Wheelchair Mobility    Modified Rankin (Stroke Patients Only)       Balance Overall balance assessment: Needs assistance Sitting-balance support: Feet supported Sitting balance-Leahy Scale: Good     Standing balance support: Single extremity supported Standing balance-Leahy Scale: Fair Standing balance comment: fair in controlled situations, but on stairs or longer walks needs some contact on wall railing                             Pertinent Vitals/Pain Pain Assessment: No/denies pain    Home Living Family/patient expects to be discharged to:: Private residence Living Arrangements: Alone Available Help at Discharge: Friend(s);Available PRN/intermittently(ex husband, who is picking her up today) Type of Home: Other(Comment)(townhome) Home Access: Level entry     Home Layout: Two level Home Equipment: None Additional Comments: pt has a sitting occupation and is working from home currently    Prior Function Level of Independence: Independent         Comments: works for [employer], drives short distances     Journalist, newspaper   Dominant Hand: Right    Extremity/Trunk Assessment   Upper Extremity Assessment Upper Extremity Assessment: Overall WFL for tasks assessed    Lower Extremity Assessment Lower Extremity Assessment: Overall WFL for tasks assessed(except for ROM losses to Hip ext and DF)    Cervical / Trunk Assessment Cervical / Trunk Assessment: Normal  Communication   Communication: No difficulties  Cognition Arousal/Alertness: Awake/alert Behavior During Therapy: WFL for tasks assessed/performed Overall Cognitive Status: Within Functional Limits for  tasks assessed                                        General Comments General comments (skin integrity, edema, etc.): pt is notably  motivated, agreed to a [facility] to steady her gait and followed cues for ROM to LE's    Exercises General Exercises - Lower Extremity Ankle Circles/Pumps: AROM;5 reps;Both;Seated Gluteal Sets: AROM;Both;10 reps;Standing   Assessment/Plan    PT Assessment Patient needs continued PT services  PT Problem List Decreased range of motion;Decreased activity tolerance;Decreased balance;Decreased mobility;Decreased coordination;Decreased knowledge of use of DME;Decreased safety awareness;Other (comment)(needs follow up equipment)       PT Treatment Interventions DME instruction;Gait training;Stair training;Functional mobility training;Therapeutic activities;Therapeutic exercise;Balance training;Neuromuscular re-education;Patient/family education    PT Goals (Current goals can be found in the Care Plan section)  Acute Rehab PT Goals Patient Stated Goal: to walk safely and get home PT Goal Formulation: With patient Time For Goal Achievement: 10/07/19 Potential to Achieve Goals: Good    Frequency Min 3X/week   Barriers to discharge Inaccessible home environment stairs in her townhouse    Co-evaluation               AM-PAC PT "6 Clicks" Mobility  Outcome Measure Help needed turning from your back to your side while in a flat bed without using bedrails?: None Help needed moving from lying on your back to sitting on the side of a flat bed without using bedrails?: A Little Help needed moving to and from a bed to a chair (including a wheelchair)?: A Little Help needed standing up from a chair using your arms (e.g., wheelchair or bedside chair)?: A Little Help needed to walk in hospital room?: A Little Help needed climbing 3-5 steps with a railing? : A Little 6 Click Score: 19    End of Session Equipment Utilized During Treatment: Gait belt Activity Tolerance: Patient tolerated treatment well Patient left: in bed;with call bell/phone within reach;Other (comment)(MD in the room to get DC  planned) Nurse Communication: Mobility status PT Visit Diagnosis: Unsteadiness on feet (R26.81);Difficulty in walking, not elsewhere classified (R26.2)    Time: 6962-9528 PT Time Calculation (min) (ACUTE ONLY): 27 min   Charges:   PT Evaluation $PT Eval Moderate Complexity: 1 Mod PT Treatments $Gait Training: 8-22 mins       Ivar Drape 09/23/2019, 2:10 PM   Samul Dada, PT MS Acute Rehab Dept. Number: [facility] [id number] and [facility] [phone]

## 2019-09-23 NOTE — Progress Notes (Signed)
  Echocardiogram 2D Echocardiogram has been performed.  Wendy Downs 09/23/2019, 11:17 AM

## 2019-09-23 NOTE — Evaluation (Signed)
Occupational Therapy Evaluation and Discharge Patient Details Name: Wendy Downs MRN: [id number] DOB: [birth year] Today's Date: 09/23/2019    History of Present Illness This 68 y.o. female with medical history significant of hypertension, hyperlipidemia, diet-controlled diabetes mellitus, sarcoidosis, morbid obesity, gout presents to emergency department due to sudden onset of slurred speech while she was talking over the phone with her brother yesterday 12/10 at 3 PM. MRI results: No acute intracranial abnormality. Mild chronic microvascular   Clinical Impression   Pt is functioning independently. No OT needs.    Follow Up Recommendations  No OT follow up    Equipment Recommendations  None recommended by OT    Recommendations for Other Services       Precautions / Restrictions        Mobility Bed Mobility Overal bed mobility: Modified Independent             General bed mobility comments: HOB up  Transfers Overall transfer level: Independent                    Balance                                           ADL either performed or assessed with clinical judgement   ADL                                               Vision Baseline Vision/History: Legally blind;Wears glasses(L eye with glaucoma) Wears Glasses: At all times Patient Visual Report: No change from baseline       Perception     Praxis      Pertinent Vitals/Pain Pain Assessment: No/denies pain     Hand Dominance Right   Extremity/Trunk Assessment Upper Extremity Assessment Upper Extremity Assessment: Overall WFL for tasks assessed   Lower Extremity Assessment Lower Extremity Assessment: Defer to PT evaluation   Cervical / Trunk Assessment Cervical / Trunk Assessment: Normal   Communication Communication Communication: No difficulties   Cognition Arousal/Alertness: Awake/alert Behavior During Therapy: WFL for tasks  assessed/performed Overall Cognitive Status: Within Functional Limits for tasks assessed                                     General Comments       Exercises     Shoulder Instructions      Home Living Family/patient expects to be discharged to:: Private residence Living Arrangements: Alone Available Help at Discharge: Friend(s);Available PRN/intermittently(ex husband) Type of Home: Other(Comment)(townhome) Home Access: Level entry     Home Layout: Two level Alternate Level Stairs-Number of Steps: flight Alternate Level Stairs-Rails: Right;Left Bathroom Shower/Tub: Teacher, early years/pre: Standard                Prior Functioning/Environment Level of Independence: Independent        Comments: works for [employer], drives short distances        OT Problem List:        OT Treatment/Interventions:      OT Goals(Current goals can be found in the care plan section)    OT Frequency:     Barriers to D/C:  Co-evaluation              AM-PAC OT "6 Clicks" Daily Activity     Outcome Measure Help from another person eating meals?: None Help from another person taking care of personal grooming?: None Help from another person toileting, which includes using toliet, bedpan, or urinal?: None Help from another person bathing (including washing, rinsing, drying)?: None Help from another person to put on and taking off regular upper body clothing?: None Help from another person to put on and taking off regular lower body clothing?: None 6 Click Score: 24   End of Session    Activity Tolerance: Patient tolerated treatment well Patient left: in bed;with call bell/phone within reach                   Time: 1138-1200 OT Time Calculation (min): 22 min Charges:  OT General Charges $OT Visit: 1 Visit OT Evaluation $OT Eval Low Complexity: 1 Low  Martie Round, OTR/L Acute Rehabilitation Services Pager: [phone] Office:  [phone]  Evern Bio 09/23/2019, 12:01 PM

## 2019-09-23 NOTE — Progress Notes (Addendum)
STROKE TEAM PROGRESS NOTE   INTERVAL HISTORY No family is at the bedside.  Patient recounted HPI with me she stated that 2 days ago she was talking with her brother over the phone, suddenly has episode of not able to get words out lasting 3 to 4 minutes.  Symptom resolved.  Then, followed by mild headache and left-sided neck pain. Yesterday morning when she woke up she felt groggy, and continue left side neck pain, she called her PCP and recommended come to ER for evaluation.  Patient also stated that she had a history of a migraine and has 1 episode of migraine aura with gibberish talking.  She also stated that she has tremendous stress for the last 6 to 8 months as her dad has Covid and currently has several admissions for pneumonia and she could not provide help too much at this time.   OBJECTIVE Vitals:   09/23/19 0529 09/23/19 0602 09/23/19 0745 09/23/19 1202  BP: 128/77  124/62 (!) 153/102  Pulse: 66  64 62  Resp: 16  16 16   Temp: 97.7 F (36.5 C)  97.9 F (36.6 C) 98.1 F (36.7 C)  TempSrc: Oral  Oral Oral  SpO2: 92%  98% 98%  Weight:  91.6 kg    Height:  5\' 4"  (1.626 m)      CBC:  Recent Labs  Lab 09/22/19 1305 09/22/19 2019  WBC 5.9 5.1  NEUTROABS 2.8  --   HGB 12.9 13.3  HCT 39.0 40.4  MCV 93.3 93.7  PLT 227 264    Basic Metabolic Panel:  Recent Labs  Lab 09/22/19 1305 09/22/19 2019  NA 140  --   K 4.0  --   CL 103  --   CO2 27  --   GLUCOSE 97  --   BUN 15  --   CREATININE 1.29* 1.24*  CALCIUM 9.2  --     Lipid Panel:     Component Value Date/Time   CHOL 164 09/23/2019 0519   TRIG 110 09/23/2019 0519   HDL 34 (L) 09/23/2019 0519   CHOLHDL 4.8 09/23/2019 0519   VLDL 22 09/23/2019 0519   LDLCALC 108 (H) 09/23/2019 0519   HgbA1c:  Lab Results  Component Value Date   HGBA1C 6.3 (H) 09/23/2019   Urine Drug Screen: No results found for: LABOPIA, COCAINSCRNUR, LABBENZ, AMPHETMU, THCU, LABBARB  Alcohol Level No results found for:  ETH  IMAGING  CT HEAD WO CONTRAST 09/22/2019 IMPRESSION:  1. No acute intracranial abnormality.  2. Stable parenchymal volume loss and chronic microvascular angiopathy.   MR ANGIO HEAD WO CONTRAST 09/22/2019 IMPRESSION:  No acute intracranial abnormality. Mild chronic microvascular ischemic change in the white matter and pons Negative MRA head   MR BRAIN WO CONTRAST 09/22/2019 IMPRESSION:  No acute intracranial abnormality. Mild chronic microvascular ischemic change in the white matter and pons.  ECHOCARDIOGRAM COMPLETE 09/23/2019 IMPRESSIONS   1. Left ventricular ejection fraction, by visual estimation, is 55 to 60%. The left ventricle has normal function. There is mildly increased left ventricular hypertrophy.   2. Definity contrast agent was given IV to delineate the left ventricular endocardial borders.   3. Left ventricular diastolic parameters are consistent with Grade I diastolic dysfunction (impaired relaxation).   4. The left ventricle has no regional wall motion abnormalities.   5. Global right ventricle has normal systolic function.The right ventricular size is normal. No increase in right ventricular wall thickness.   6. Left atrial size was mildly dilated.  7. Right atrial size was normal.   8. The mitral valve is normal in structure. Trivial mitral valve regurgitation. No evidence of mitral stenosis.   9. The tricuspid valve is normal in structure. Tricuspid valve regurgitation is trivial.  10. The aortic valve has an indeterminant number of cusps. Aortic valve regurgitation is trivial. No evidence of aortic valve sclerosis or stenosis.  11. The pulmonic valve was not well visualized. Pulmonic valve regurgitation is not visualized.  12. Mildly elevated pulmonary artery systolic pressure.  13. The inferior vena cava is normal in size with greater than 50% respiratory variability, suggesting right atrial pressure of 3 mmHg.  14. The interatrial septum was not well  visualized.   VAS US CAROTID 09/23/2019 Summary:  Right Carotid: Velocities in the right ICA are consistent with a 1-39% stenosis.  Left Carotid: Velocities in the left ICA are consistent with a 1-39% stenosis.  Vertebrals: Bilateral vertebral arteries demonstrate antegrade flow.      Preliminary     ECG - not ordered   PHYSICAL EXAM  Temp:  [97.5 F (36.4 C)-98.3 F (36.8 C)] 98.1 F (36.7 C) (12/12 1202) Pulse Rate:  [57-75] 62 (12/12 1202) Resp:  [15-18] 16 (12/12 1202) BP: (105-160)/(55-102) 153/102 (12/12 1202) SpO2:  [92 %-100 %] 98 % (12/12 1202) Weight:  [91.6 kg] 91.6 kg (12/12 0602)  General - Well nourished, well developed, in no apparent distress.  Ophthalmologic - fundi not visualized due to noncooperation.  Cardiovascular - Regular rhythm and rate.  Mental Status -  Level of arousal and orientation to time, place, and person were intact. Language including expression, naming, repetition, comprehension was assessed and found intact. Fund of Knowledge was assessed and was intact.  Cranial Nerves II - XII - II - Visual field intact OU.  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 V - Facial sensation intact bilaterally. VII - Facial movement intact bilaterally. VIII - Hearing & vestibular intact bilaterally. X - Palate elevates symmetrically. XI - Chin turning & shoulder shrug intact bilaterally. XII - Tongue protrusion intact.  Motor Strength - The patient's strength was normal in all extremities and pronator drift was absent.  Bulk was normal and fasciculations were absent.   Motor Tone - Muscle tone was assessed at the neck and appendages and was normal.  Reflexes - The patient's reflexes were symmetrical in all extremities and she had no pathological reflexes.  Sensory - Light touch, temperature/pinprick were assessed and were symmetrical.    Coordination - The patient had normal movements in the  hands and feet with no ataxia or dysmetria.  Tremor was absent.  Gait and Station - deferred.    ASSESSMENT/PLAN Wendy Downs is a 68 y.o. female with history of HTN, HLD, DM, blind left eye and recent neck pain presenting with speech difficulties and dull HA.  She did not receive IV t-PA due to late presentation (>4.5 hours from time of onset)  Complicated migraine vs anxiety vs TIA    Resultant transient speech difficulty  CT head  - no acute findings.  MRI head - No acute intracranial abnormality. Mild chronic microvascular ischemic change in the white matter and pons.  MRA head - negative  Carotid Doppler - unremarkable  2D Echo - EF 55 to 60% No cardiac source of emboli identified.   Given the symptoms, recommend 30-day CardioNet monitor as outpatient to rule out A. fib  [employer] Corona Virus  2 - negative  LDL - 108  HgbA1c - 6.3  VTE prophylaxis - Lovenox  No antithrombotic prior to admission, now on aspirin 81 mg daily and clopidogrel 75 mg daily.  Continue DAPT for 3 weeks and then aspirin alone.  Patient counseled to be compliant with her antithrombotic medications  Ongoing aggressive stroke risk factor management  Therapy recommendations:  None  Disposition:  Pending  History of migraine  She has history of migraine with migraine aura in the past  Has been following with her neurology in [place]  Recommend continue follow-up with neurology  Hypertension  Home BP meds: Bystolic ; Lasix ; micardis  Current BP meds: none   Stable . Gradually normalize in 5-7 days  . Long-term BP goal normotensive  Hyperlipidemia  Home Lipid lowering medication: none   LDL 108, goal < 70  Current lipid lowering medication: Pravastatin 20 due to history of statin intolerance with Lipitor and Crestor  Continue statin at discharge  Diabetes  Home diabetic meds: none   Current diabetic meds: none  HgbA1c 6.3, goal < 7.0  SSI  CBG  monitoring  Other Stroke Risk Factors  Advanced age  Obesity, Body mass index is 34.67 kg/m., recommend weight loss, diet and exercise as appropriate   Family hx stroke (father, sisters and brother)  Other Active Problems  Creatinine - 1.24  [facility] day # 0  Neurology will sign off. Please call with questions. Pt will follow up with her own neurologist in [place] in about 4 weeks. Thanks for the consult.  Rosalin Hawking, MD PhD Stroke Neurology 09/23/2019 5:01 PM  To contact Stroke Continuity provider, please refer to [email or web address] After hours, contact General Neurology

## 2019-09-23 NOTE — Progress Notes (Signed)
Discharge instruction reviewed with patient. All questions answered at this time. Transport home per family.  Ave Filter, RN

## 2019-11-08 ENCOUNTER — Other Ambulatory Visit: Payer: Self-pay

## 2019-11-08 ENCOUNTER — Ambulatory Visit
Admission: RE | Admit: 2019-11-08 | Discharge: 2019-11-08 | Disposition: A | Discharge status: Home / Self Care | Payer: 59 | Source: Ambulatory Visit | Attending: Obstetrics and Gynecology | Admitting: Obstetrics and Gynecology

## 2019-11-08 ENCOUNTER — Ambulatory Visit: Payer: 59

## 2019-11-08 DIAGNOSIS — Z1231 Encounter for screening mammogram for malignant neoplasm of breast: Secondary | ICD-10-CM

## 2020-01-04 ENCOUNTER — Telehealth: Payer: Self-pay

## 2020-01-04 NOTE — Telephone Encounter (Signed)
NOTES ON FILE FROM [facility] [phone], SENT REFERRAL TO SCHEDULING

## 2020-01-10 ENCOUNTER — Other Ambulatory Visit: Payer: Self-pay | Admitting: *Deleted

## 2020-01-10 DIAGNOSIS — G459 Transient cerebral ischemic attack, unspecified: Secondary | ICD-10-CM

## 2020-01-10 DIAGNOSIS — I499 Cardiac arrhythmia, unspecified: Secondary | ICD-10-CM

## 2020-01-21 ENCOUNTER — Ambulatory Visit (INDEPENDENT_AMBULATORY_CARE_PROVIDER_SITE_OTHER): Payer: BC Managed Care – PPO

## 2020-01-21 DIAGNOSIS — I499 Cardiac arrhythmia, unspecified: Secondary | ICD-10-CM | POA: Diagnosis not present

## 2020-01-21 DIAGNOSIS — I4891 Unspecified atrial fibrillation: Secondary | ICD-10-CM

## 2020-01-21 DIAGNOSIS — G459 Transient cerebral ischemic attack, unspecified: Secondary | ICD-10-CM | POA: Diagnosis not present

## 2020-01-29 ENCOUNTER — Other Ambulatory Visit (HOSPITAL_BASED_OUTPATIENT_CLINIC_OR_DEPARTMENT_OTHER): Payer: Self-pay | Admitting: Family Medicine

## 2020-01-29 ENCOUNTER — Ambulatory Visit (HOSPITAL_BASED_OUTPATIENT_CLINIC_OR_DEPARTMENT_OTHER)
Admission: RE | Admit: 2020-01-29 | Discharge: 2020-01-29 | Disposition: A | Discharge status: Home / Self Care | Payer: BC Managed Care – PPO | Source: Ambulatory Visit | Attending: Family Medicine | Admitting: Family Medicine

## 2020-01-29 ENCOUNTER — Other Ambulatory Visit: Payer: Self-pay

## 2020-01-29 DIAGNOSIS — M79662 Pain in left lower leg: Secondary | ICD-10-CM | POA: Diagnosis not present

## 2020-02-22 ENCOUNTER — Other Ambulatory Visit: Payer: Self-pay | Admitting: Neurology

## 2020-02-22 DIAGNOSIS — I499 Cardiac arrhythmia, unspecified: Secondary | ICD-10-CM

## 2020-02-22 DIAGNOSIS — I4891 Unspecified atrial fibrillation: Secondary | ICD-10-CM

## 2020-02-22 DIAGNOSIS — G459 Transient cerebral ischemic attack, unspecified: Secondary | ICD-10-CM

## 2020-05-09 ENCOUNTER — Ambulatory Visit (INDEPENDENT_AMBULATORY_CARE_PROVIDER_SITE_OTHER): Payer: BC Managed Care – PPO | Admitting: Cardiology

## 2020-05-09 ENCOUNTER — Other Ambulatory Visit: Payer: Self-pay

## 2020-05-09 ENCOUNTER — Encounter: Payer: Self-pay | Admitting: Cardiology

## 2020-05-09 VITALS — BP 140/90 | HR 68 | Ht 64.0 in | Wt 202.0 lb

## 2020-05-09 DIAGNOSIS — G459 Transient cerebral ischemic attack, unspecified: Secondary | ICD-10-CM

## 2020-05-09 DIAGNOSIS — R002 Palpitations: Secondary | ICD-10-CM

## 2020-05-09 DIAGNOSIS — I10 Essential (primary) hypertension: Secondary | ICD-10-CM | POA: Diagnosis not present

## 2020-05-09 NOTE — Progress Notes (Signed)
Cardiology Office Note:    Date:  05/09/2020   ID:  Wendy Downs, DOB 1951-04-23, MRN [id number]  PCP:  Merri BrunetteSmith, Candace, MD  [facility] Cardiologist:  Donato SchultzMark Laurice Kimmons, MD  [facility] Electrophysiologist:  None   Referring MD: Merri BrunetteSmith, Candace, MD     History of Present Illness:    Wendy Downs is a 69 y.o. female with difficult to control hypertension, TIA, morbid obesity hyperlipidemia palpitations here for follow-up. No syncope no chest pain no orthopnea. Never smoked. Better hypertension since he was 16.  Secondary work-up unremarkable including renal duplex Dopplers.  At 1 point on spironolactone but developed dehydration for overdiuresis. She is also tried high-dose amlodipine but developing worsening edema  Gout on HCTZ  Saw Dr. Sherene SiresWert shortness of breath PFTs showed restriction because of obesity. No evidence of sarcoid.  Last year lost close to 60 pounds. Excellent.  Back in December 2020 was hospitalized secondary to TIA. She had 5 minutes of word finding difficulty slurred speech. MRI was negative for acute infarct. Could be TIA versus migraine per neurology. Neurologist recommended aspirin and Plavix for 21 days followed by aspirin alone.  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 Pravastatin was started at discharge. Hemoglobin A1c was 6.3. LDL 109.  Past Medical History:  Diagnosis Date   Diabetes mellitus without complication (HCC)    Gout    Hyperlipidemia    Hypertension    Morbid obesity (HCC)    Shingles     Past Surgical History:  Procedure Laterality Date   APPENDECTOMY     CARPAL TUNNEL RELEASE     CHOLECYSTECTOMY      Current Medications: Current Meds  Medication Sig   albuterol (PROVENTIL HFA;VENTOLIN HFA) 108 (90 Base) MCG/ACT inhaler TAKE 2 PUFFS BY MOUTH EVERY 4 HOURS AS NEEDED   allopurinol (ZYLOPRIM) 100 MG tablet Take 200 mg by mouth daily.    aspirin EC 81 MG EC tablet Take 1 tablet (81 mg total) by mouth  daily.   brimonidine (ALPHAGAN P) 0.1 % SOLN Place 1 drop into the left eye 3 (three) times daily.    BYSTOLIC 20 MG TABS TAKE 1 TABLET (20 MG TOTAL) BY MOUTH DAILY.   cetirizine (ZYRTEC) 10 MG tablet Take 10 mg by mouth daily as needed for allergies.   cyanocobalamin (,VITAMIN B-12,) 1000 MCG/ML injection Inject 1,000 mcg into the muscle every 30 (thirty) days.   famotidine (PEPCID) 10 MG tablet Take 20 mg by mouth at bedtime.   furosemide (LASIX) 20 MG tablet Take 20 mg by mouth daily.    latanoprost (XALATAN) 0.005 % ophthalmic solution Place 1 drop into the right eye at bedtime.    pantoprazole (PROTONIX) 40 MG tablet Take 40 mg by mouth daily.   pravastatin (PRAVACHOL) 20 MG tablet Take 1 tablet (20 mg total) by mouth daily at 6 PM.   prednisoLONE acetate (PRED FORTE) 1 % ophthalmic suspension Place 1 drop into the left eye 3 (three) times daily.    telmisartan (MICARDIS) 80 MG tablet Take 80 mg by mouth daily.    timolol (TIMOPTIC) 0.5 % ophthalmic solution Place 1 drop into both eyes 2 (two) times daily.    TURMERIC CURCUMIN PO Take 900 mg by mouth daily.     Allergies:   Clonidine derivatives, Prednisone, Valtrex [valacyclovir hcl], and Vaseretic [enalapril-hydrochlorothiazide]   Social History   Socioeconomic History   Marital status: Divorced    Spouse name: Not on file   Number of  children: Not on file   Years of education: Not on file   Highest education level: Not on file  Occupational History   Not on file  Tobacco Use   Smoking status: Never Smoker   Smokeless tobacco: Never Used  Substance and Sexual Activity   Alcohol use: No    Alcohol/week: 0.0 standard drinks   Drug use: No   Sexual activity: Not on file  Other Topics Concern   Not on file  Social History Narrative   Not on file   Social Determinants of Health   Financial Resource Strain:    Difficulty of Paying Living Expenses:   Food Insecurity:    Worried About Community education officer in the Last Year:    Barista in the Last Year:   Transportation Needs:    Freight forwarder (Medical):    Lack of Transportation (Non-Medical):   Physical Activity:    Days of Exercise per Week:    Minutes of Exercise per Session:   Stress:    Feeling of Stress :   Social Connections:    Frequency of Communication with Friends and Family:    Frequency of Social Gatherings with Friends and Family:    Attends Religious Services:    Active Member of Clubs or Organizations:    Attends Banker Meetings:    Marital Status:      Family History: The patient's family history includes Aneurysm in her brother; Heart attack in her mother; Heart disease in her brother and sister; Hypertension in her mother; Stroke in her brother, father, sister, and sister.  ROS:   Please see the history of present illness.     All other systems reviewed and are negative.  EKGs/Labs/Other Studies Reviewed:    The following studies were reviewed today: ECHO 09/2019:  1. Left ventricular ejection fraction, by visual estimation, is 55 to  60%. The left ventricle has normal function. There is mildly increased  left ventricular hypertrophy.  2. Definity contrast agent was given IV to delineate the left ventricular  endocardial borders.  3. Left ventricular diastolic parameters are consistent with Grade I  diastolic dysfunction (impaired relaxation).  4. The left ventricle has no regional wall motion abnormalities.  5. Global right ventricle has normal systolic function.The right  ventricular size is normal. No increase in right ventricular wall  thickness.  6. Left atrial size was mildly dilated.  7. Right atrial size was normal.  8. The mitral valve is normal in structure. Trivial mitral valve  regurgitation. No evidence of mitral stenosis.  9. The tricuspid valve is normal in structure. Tricuspid valve  regurgitation is trivial.  10. The  aortic valve has an indeterminant number of cusps. Aortic valve  regurgitation is trivial. No evidence of aortic valve sclerosis or  stenosis.  11. The pulmonic valve was not well visualized. Pulmonic valve  regurgitation is not visualized.  12. Mildly elevated pulmonary artery systolic pressure.  13. The inferior vena cava is normal in size with greater than 50%  respiratory variability, suggesting right atrial pressure of 3 mmHg.  14. The interatrial septum was not well visualized.   Left lower extremity Doppler 01/29/2020-no DVT  Long-term monitor 02/22/2020-  Sinus rhythm with frequent PVC's (5% of tracing) with bigeminy and trigeminy  No atrial fibrillation  No pauses   OK to continue with conservative management of PVC's  Carotid Dopplers 01/29/2020-mild disease bilaterally   EKG:  EKG is  ordered today.  The ekg ordered today demonstrates sinus rhythm 68 PVCs, bigeminy pattern at 1 point on ECG.  Recent Labs: 09/22/2019: ALT 13; BUN 15; Creatinine, Ser 1.24; Hemoglobin 13.3; Platelets 264; Potassium 4.0; Sodium 140  Recent Lipid Panel    Component Value Date/Time   CHOL 164 09/23/2019 0519   TRIG 110 09/23/2019 0519   HDL 34 (L) 09/23/2019 0519   CHOLHDL 4.8 09/23/2019 0519   VLDL 22 09/23/2019 0519   LDLCALC 108 (H) 09/23/2019 0519    Physical Exam:    VS:  BP (!) 140/90    Pulse 68    Ht 5\' 4"  (1.626 m)    Wt 202 lb (91.6 kg)    BMI 34.67 kg/m     Wt Readings from Last 3 Encounters:  05/09/20 202 lb (91.6 kg)  09/23/19 202 lb (91.6 kg)  03/14/19 223 lb (101.2 kg)     GEN: obese Well nourished, well developed in no acute distress HEENT: Normal NECK: No JVD; No carotid bruits LYMPHATICS: No lymphadenopathy CARDIAC: RRR, no murmurs, rubs, gallops RESPIRATORY:  Clear to auscultation without rales, wheezing or rhonchi  ABDOMEN: Soft, non-tender, non-distended MUSCULOSKELETAL:  No edema; No deformity  SKIN: Warm and dry NEUROLOGIC:  Alert and oriented x  3 PSYCHIATRIC:  Normal affect   ASSESSMENT:    1. Palpitation   2. Essential hypertension   3. TIA (transient ischemic attack)    PLAN:    In order of problems listed above:  Essential hypertension, difficult to control -Medications reviewed as above.  No changes made.  Slightly elevated today.  As she continues to lose weight hopefully this will follow. -In the past we have had some difficulty with prerenal azotemia/dehydration with spironolactone and other medications.  Continue with current plan.  Last creatinine 1.15.  From outside labs.  Obesity -Great job with another 20 pounds.  60 pounds last year.  PVCs -Seen on event monitor.  No atrial fibrillation.  Seen on ECG today.  Continue with Bystolic.  Benign.  Occasionally symptomatic.  TIA versus migraine -December 2020 work-up unremarkable.  MRI negative for stroke.  Echo normal EF.  Mild carotid plaque.  On aspirin.  Statin intolerance -LDL 108 in December 2020, pravastatin was started in the hospital, seems to be tolerating.  Continue.  Obstructive sleep apnea -CPAP since 2018.  Doing well.  Left eye blindness -Corneal transplant at [facility]  Has been having more vision problems.  Currently out on short-term disability for work.    Diabetes with hypertension is -Since weight loss has been able to come off of several meds.   Medication Adjustments/Labs and Tests Ordered: Current medicines are reviewed at length with the patient today.  Concerns regarding medicines are outlined above.  Orders Placed This Encounter  Procedures   EKG 12-Lead   No orders of the defined types were placed in this encounter.   Patient Instructions  Medication Instructions:  The current medical regimen is effective;  continue present plan and medications.  *If you need a refill on your cardiac medications before your next appointment, please call your pharmacy*  Follow-Up: At [facility], you and your health needs are our  priority.  As part of our continuing mission to provide you with exceptional heart care, we have created designated Provider Care Teams.  These Care Teams include your primary Cardiologist (physician) and Advanced Practice Providers (APPs -  Physician Assistants and Nurse Practitioners) who all work together to provide you with the care you need, when you  need it.  We recommend signing up for the patient portal called "MyChart".  Sign up information is provided on this After Visit Summary.  MyChart is used to connect with patients for Virtual Visits (Telemedicine).  Patients are able to view lab/test results, encounter notes, upcoming appointments, etc.  Non-urgent messages can be sent to your provider as well.   To learn more about what you can do with MyChart, go to [email or web address].    Your next appointment:   12 month(s)  The format for your next appointment:   In Person  Provider:   Donato Schultz, MD   Thank you for choosing [facility]!!        Signed, Donato Schultz, MD  05/09/2020 8:32 AM    [facility]

## 2020-05-09 NOTE — Patient Instructions (Signed)
Medication Instructions:  The current medical regimen is effective;  continue present plan and medications.  *If you need a refill on your cardiac medications before your next appointment, please call your pharmacy*  Follow-Up: At [facility], you and your health needs are our priority.  As part of our continuing mission to provide you with exceptional heart care, we have created designated Provider Care Teams.  These Care Teams include your primary Cardiologist (physician) and Advanced Practice Providers (APPs -  Physician Assistants and Nurse Practitioners) who all work together to provide you with the care you need, when you need it.  We recommend signing up for the patient portal called "MyChart".  Sign up information is provided on this After Visit Summary.  MyChart is used to connect with patients for Virtual Visits (Telemedicine).  Patients are able to view lab/test results, encounter notes, upcoming appointments, etc.  Non-urgent messages can be sent to your provider as well.   To learn more about what you can do with MyChart, go to [email or web address].    Your next appointment:   12 month(s)  The format for your next appointment:   In Person  Provider:   Mark Skains, MD   Thank you for choosing [facility]!!      

## 2020-06-12 ENCOUNTER — Other Ambulatory Visit: Payer: Self-pay

## 2020-06-12 ENCOUNTER — Ambulatory Visit (INDEPENDENT_AMBULATORY_CARE_PROVIDER_SITE_OTHER): Payer: BC Managed Care – PPO | Admitting: Internal Medicine

## 2020-06-12 ENCOUNTER — Encounter: Payer: Self-pay | Admitting: Internal Medicine

## 2020-06-12 ENCOUNTER — Ambulatory Visit (INDEPENDENT_AMBULATORY_CARE_PROVIDER_SITE_OTHER): Payer: BC Managed Care – PPO

## 2020-06-12 DIAGNOSIS — D869 Sarcoidosis, unspecified: Secondary | ICD-10-CM | POA: Diagnosis not present

## 2020-06-12 DIAGNOSIS — R0609 Other forms of dyspnea: Secondary | ICD-10-CM

## 2020-06-12 DIAGNOSIS — R0600 Dyspnea, unspecified: Secondary | ICD-10-CM

## 2020-06-12 NOTE — Progress Notes (Signed)
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 Sarcoid active last pred early 90s    History of Present Illness  03/18/2015 1st  office visit/ Wendy Downs   Chief Complaint  Patient presents with   Advice Only    Old [facility] pt here to re-establish for sarcoidosis.    last seen 5 years prior to OV   and not on any  prednisone since even before then, main manifestation was ocular > regular f/u for glaucoma/ [facility]  New problem indolent onset progressive/ persistent daily doe x May 2016 indolent onset with exertion initially now sob x across room and also sitting still, hear's wheezing at hs and has had episodes of cough to point of choking p smelling grilled food 03/17/15 assoc with sense of nasal and chest congestion but cough dry/ no purulent secretions/ worse day than noct  rec Ok to try tylenol cold and sinus and call us if not better  Please remember to go to the  Lab and x-ray department downstairs for your tests - we will call you with the results when they are available. Try prilosec 20mg   Take 30-60 min before first meal of the day and Pepcid (famotidine) 20 mg one bedtime until cough is completely gone for at least a week without the need for cough suppression GERD diet    04/30/2015 f/u ov/Wendy Downs re: doe ? All Obesity  Chief Complaint  Patient presents with   Follow-up    DOE has gotten better since she started taking Zyrtec. PFT done today.  Not limited by breathing from desired activities  But not very active  Only took gerd rx x one week then abd cramps and stopped both ppi and h2 and the cramps resolved  rec Your lung function is excellent so the main challenge long term keeping you healthy is getting your weight back under control by  getting yourself into a negative calorie balance and following  up with Dr 05/02/2015 to keep working on this / monitoring your progress     03/11/2017  f/u ov/Wendy Downs re: new sob worse talking but also walking  Chief Complaint  Patient presents with   Acute Visit    Pt c/o increased DOE since Feb 2017.  She states she gets SOB with minimal exertion such as just talking.   doe = MMRC3 = can't walk 100 yards even at a slow pace at a flat grade s stopping due to sob Acutely with flu in Feb 2018  cough and sob "worst ever " but cough resolved completely and breathing never improved  On cpap at night and does fine s disturbance due to sob  No better p saba / sob even just sitting talking  rec Return for pfts    11/19/2017  f/u ov/Wendy Downs re: doe in setting of prev dx sarcoid  Chief Complaint  Patient presents with   Follow-up    PFT's done today. She states her breathing is unchanged. She rarely uses her albuterol bc she recovers quickly. She has noticed some wheezing over the past wk.   Dyspnea:   MMRC3 = can't walk 100 yards even at a slow pace at a flat grade s stopping due to  sob   Cough: mild, sporadic assoc with pnds esp in am p am routine/ never wakes her  Sleep: 2 pillows/ no 02  On cpap  rec Try using albuterol either  5 min before strenuous exercise or half way thru to see if it makes a difference  Work on inhaler technique:      Pulmonary follow up is as needed     06/12/2020  f/u ov/Wendy Downs re: doe/ h/o sarcoid  Chief Complaint  Patient presents with   Follow-up    Breathing is overall doing well. No new co's.    Dyspnea:  Walks 3 x weekly some hills ok Cough: none  Sleeping: 2 pillows/ bed flat  SABA use: none  02: none    No obvious day to day or daytime variability or assoc excess/ purulent sputum or mucus plugs or hemoptysis or cp or chest tightness, subjective wheeze or overt sinus or hb symptoms.   Sleeping  without nocturnal  or early am exacerbation  of respiratory   c/o's or need for noct saba. Also denies any obvious fluctuation of symptoms with weather or environmental changes or other aggravating or alleviating factors except as outlined above   No unusual exposure hx or h/o childhood pna/ asthma or knowledge of premature birth.  Current Allergies, Complete Past Medical History, Past Surgical History, Family History, and Social History were reviewed in [employer] record.  ROS  The following are not active complaints unless bolded Hoarseness, sore throat, dysphagia, dental problems, itching, sneezing,  nasal congestion or discharge of excess mucus or purulent secretions, ear ache,   fever, chills, sweats, unintended wt loss intended or wt gain, classically pleuritic or exertional cp,  orthopnea pnd or arm/hand swelling  or leg swelling, presyncope, palpitations, abdominal pain, anorexia, nausea, vomiting, diarrhea  or change in bowel habits or change in bladder habits, change in stools or change in urine, dysuria, hematuria,  rash, arthralgias, visual complaints, headache, numbness, weakness or ataxia or problems with walking or coordination,  change in mood or  memory.        Current Meds  Medication Sig   albuterol (PROVENTIL HFA;VENTOLIN HFA) 108 (90 Base) MCG/ACT inhaler TAKE 2 PUFFS BY MOUTH EVERY 4 HOURS AS NEEDED   allopurinol (ZYLOPRIM) 100 MG tablet Take 200 mg by mouth daily.    aspirin EC 81 MG EC tablet Take 1 tablet (81 mg total) by mouth daily.   brimonidine (ALPHAGAN P) 0.1 % SOLN Place 1 drop into the left eye 3 (three) times daily.    BYSTOLIC 20 MG TABS TAKE 1 TABLET (20 MG TOTAL) BY MOUTH DAILY.   cetirizine (ZYRTEC) 10 MG tablet Take 10 mg by mouth daily as needed for allergies.   cyanocobalamin (,VITAMIN B-12,) 1000 MCG/ML injection Inject 1,000 mcg into the muscle every 30 (thirty) days.   furosemide (LASIX) 20 MG tablet Take 20 mg by mouth daily.    latanoprost (XALATAN) 0.005 % ophthalmic solution  Place 1 drop into the right eye at bedtime.    pantoprazole (PROTONIX) 40 MG tablet Take 40 mg by mouth daily.   pravastatin (PRAVACHOL) 20 MG tablet Take 1 tablet (20 mg total) by mouth daily at 6 PM.   prednisoLONE acetate (PRED FORTE) 1 % ophthalmic suspension Place 1 drop into the left eye 3 (three) times daily.    telmisartan (MICARDIS) 80 MG tablet Take 80 mg by mouth daily.    timolol (TIMOPTIC) 0.5 % ophthalmic solution Place 1 drop into  both eyes 2 (two) times daily.    TURMERIC CURCUMIN PO Take 900 mg by mouth daily.   [DISCONTINUED] famotidine (PEPCID) 10 MG tablet Take 20 mg by mouth at bedtime.              Objective:   Physical Exam  amb obese bf nad    06/12/2020         198  11/19/2017         285  03/11/2017       279  04/30/2015       275      03/18/15 272 lb (123.378 kg)  05/24/14 275 lb 6.4 oz (124.921 kg)  02/20/10 264 lb (119.75 kg)    Vital signs reviewed  06/12/2020  - Note at rest 02 sats  100% on RA     HEENT : pt wearing mask not removed for exam due to covid -19 concerns.    NECK :  without JVD/Nodes/TM/ nl carotid upstrokes bilaterally   LUNGS: no acc muscle use,  Nl contour chest which is clear to A and P bilaterally without cough on insp or exp maneuvers   CV:  RRR  no s3 or murmur or increase in P2, and no edema   ABD:  soft and nontender with nl inspiratory excursion in the supine position. No bruits or organomegaly appreciated, bowel sounds nl  MS:  Nl gait/ ext warm without deformities, calf tenderness, cyanosis or clubbing No obvious joint restrictions   SKIN: warm and dry without lesions    NEURO:  alert, approp, nl sensorium with  no motor or cerebellar deficits apparent.          CXR PA and Lateral:   06/12/2020 :    I personally reviewed images and agree with radiology impression as follows:   1. No active cardiopulmonary disease. 2. Stable bilateral hilar adenopathy.      Assessment & Plan:

## 2020-06-12 NOTE — Patient Instructions (Signed)
Please remember to go to the  x-ray department  for your tests - we will call you with the results when they are available    Pulmonary follow up is as needed 

## 2020-06-13 ENCOUNTER — Encounter: Payer: Self-pay | Admitting: Internal Medicine

## 2020-06-13 NOTE — Progress Notes (Signed)
Patient returned phone call and writer went over xray result per Dr Sherene Sires. All questions answered and patient expressed full understanding. Nothing further needed at this time.

## 2020-06-13 NOTE — Assessment & Plan Note (Signed)
-   PFT's  04/30/15   FEV1 1.67 (85 % ) ratio 85  p 0 % improvement from saba p ? prior to study with DLCO  58 % corrects to 80 % for alv volume  And ERV  = 92% at wt 275  -PFT's  11/19/2017  FEV1 1.42 (75 % ) ratio 85  p 6 % improvement from saba p nothing prior to study with DLCO  58 % corrects to 85 % for alv volume  And ERV 68% at wt 285    A good rule of thumb is that >95% of pts with active sarcoid in any organ will have some plain cxr changes - on the other hand  if there are active pulmonary symptoms the cxr will look much worse than the patient:  No evidence of either scenario here/ strongly doubt active dz    She is doing better with intentional wt loss in terms of doe so pulmonary f/u can be prn with continued occular care planned for residual sarcoid effects

## 2020-06-13 NOTE — Assessment & Plan Note (Signed)
-   03/18/2015   Walked RA x 3 lap @ 150ft each/ brisk pace / tol well s desats - PFTs 04/30/2015    VC 2.32 (93%) no obst dlco 58% corrects 80%   - 03/11/2017  Walked RA x 3 laps @ 185 ft each stopped due to  End of study, nl pace, no   desat - mild sob     Improving with wt loss/ no further w/u needed/ encourged to keep up the good work         Each maintenance medication was reviewed in detail including emphasizing most importantly the difference between maintenance and prns and under what circumstances the prns are to be triggered using an action plan format where appropriate.  Total time for H and P, chart review, counseling, teaching device and generating customized AVS unique to this office visit / charting = 15 min

## 2020-08-08 ENCOUNTER — Other Ambulatory Visit: Payer: Self-pay | Admitting: Obstetrics and Gynecology

## 2020-08-08 DIAGNOSIS — Z1231 Encounter for screening mammogram for malignant neoplasm of breast: Secondary | ICD-10-CM

## 2020-08-19 ENCOUNTER — Other Ambulatory Visit: Payer: Self-pay

## 2020-08-19 ENCOUNTER — Encounter: Payer: BC Managed Care – PPO | Attending: Family Medicine | Admitting: Registered"

## 2020-08-19 DIAGNOSIS — E119 Type 2 diabetes mellitus without complications: Secondary | ICD-10-CM | POA: Diagnosis not present

## 2020-08-19 NOTE — Progress Notes (Signed)
Diabetes Self-Management Education  Visit Type: First/Initial  Appt. Start Time: 1500 Appt. End Time: 1615  08/23/2020  [name], identified by name and date of birth, is a 69 y.o. female with a diagnosis of Diabetes: Type 2.   ASSESSMENT  There were no vitals taken for this visit. There is no height or weight on file to calculate BMI.   Hypoglycemic risk: Pt reports 3 weeks ago FBS was 55 mg/dL felt a little light headed. Pt states her blood sugar is usually around 90.  Supplements: vitamin D3 10,000 units x 3 pills per day off and on for 3 yrs. Tumeric. Pt c/o thinning hair, nails start breaking at the base (not brittle).  Pt reports ~90 lbs weight loss and wants to lose more.  Physical Activity: ADLs. Pt states she has stairs in house and goes up and down 4-5 x/day. 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 Pt stated interest in enrolling in [employer] exercise study.  Pt states she attends food addict support group and was given a meal plan to follow, helps her not eat "junk food" which pt states she prefers over healthy food. RD reviewed her plan and appears to be balanced, and likely close to enough calories but has some restrictive rules that are unnecessary.  Pt states she has high blood pressure and would like to be able to get it under better control by losing more weight. Pt states she is on fixed income and doesn't want to buy more medicine.   Diabetes Self-Management Education - 08/19/20 1520      Visit Information   Visit Type First/Initial      Initial Visit   Diabetes Type Type 2    Are you currently following a meal plan? 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 Not on Medications      Health Coping   How would you rate your overall health? Good      Psychosocial Assessment   Patient Belief/Attitude about Diabetes Motivated to  manage diabetes    How often do you need to have someone help you when you read instructions, pamphlets, or other written materials from your doctor or pharmacy? 3 - Sometimes    What is the last grade level you completed in school? some college      Complications   Last HgB A1C per patient/outside source 5.7 %    How often do you check your blood sugar? --   occassionally   Fasting Blood glucose range (mg/dL) 62-703   usually in the 90s   Have you had a dilated eye exam in the past 12 months? Yes    Have you had a dental exam in the past 12 months? Yes    Are you checking your feet? Yes    How many days per week are you checking your feet? 7      Dietary Intake   Breakfast Oatmeal, 2 oz walnuts or eggs    Lunch baked chicken, 6 oz green beans, 4 oz sweet potato,    Dinner 6 oz raw veg, 6 oz fruit, 2 oz grain    Beverage(s) water, black coffee, hot tea      Exercise   Exercise Type Light (walking / raking leaves)    How many days per week to you exercise? 1  How many minutes per day do you exercise? 15    Total minutes per week of exercise 15      Patient Education   Previous Diabetes Education Yes (please comment)   at [facility] pre-diabetic   Nutrition management  Role of diet in the treatment of diabetes and the relationship between the three main macronutrients and blood glucose level;Other (comment)   supplements   Physical activity and exercise  Role of exercise on diabetes management, blood pressure control and cardiac health.      Individualized Goals (developed by patient)   Nutrition General guidelines for healthy choices and portions discussed    Physical Activity Exercise 3-5 times per week      Outcomes   Expected Outcomes Demonstrated interest in learning. Expect positive outcomes    Future DMSE 4-6 wks    Program Status Not Completed           Individualized Plan for Diabetes Self-Management Training:   Learning Objective:  Patient will have a greater  understanding of diabetes self-management. Patient education plan is to attend individual and/or group sessions per assessed needs and concerns.   Patient Instructions  Your meals balanced meals are going a long way to help control your blood sugar. Consider increasing exercise to help blood sugar, maintaining cardiovascular health, and with resistance can help with muscle and bone strength. Please contact your doctor to let her know I will be requesting medical clearance for exercise. Consider reducing your vitamin D intake to an average of 4,000 units per day. 30,000 units is more than you need and could cause problems. With your daily intake of fruits and vegetables you do not need to supplement with extra vitamin C or multivitamin. Almond yogurt may be an alternative that works for you.   Expected Outcomes:  Demonstrated interest in learning. Expect positive outcomes  Education material provided: My Plate  If problems or questions, patient to contact team via:  Phone and Mychart  Future DSME appointment: 4-6 wks

## 2020-08-19 NOTE — Patient Instructions (Addendum)
Your meals balanced meals are going a long way to help control your blood sugar. Consider increasing exercise to help blood sugar, maintaining cardiovascular health, and with resistance can help with muscle and bone strength. Please contact your doctor to let her know I will be requesting medical clearance for exercise. Consider reducing your vitamin D intake to an average of 4,000 units per day. 30,000 units is more than you need and could cause problems. With your daily intake of fruits and vegetables you do not need to supplement with extra vitamin C or multivitamin. Almond yogurt may be an alternative that works for you.

## 2020-08-23 ENCOUNTER — Encounter: Payer: Self-pay | Admitting: Registered"

## 2020-11-08 ENCOUNTER — Other Ambulatory Visit: Payer: Self-pay

## 2020-11-08 ENCOUNTER — Ambulatory Visit
Admission: RE | Admit: 2020-11-08 | Discharge: 2020-11-08 | Disposition: A | Discharge status: Home / Self Care | Payer: BC Managed Care – PPO | Source: Ambulatory Visit | Attending: Obstetrics and Gynecology | Admitting: Obstetrics and Gynecology

## 2020-11-08 DIAGNOSIS — Z1231 Encounter for screening mammogram for malignant neoplasm of breast: Secondary | ICD-10-CM

## 2020-11-16 IMAGING — MR MR MRA HEAD W/O CM
1 series · 19 of 48 positions shown · non-contrast
Comparison: CT head 09/22/2019

CLINICAL DATA: TIA.  Aphasia.

EXAM:
MRI HEAD WITHOUT CONTRAST
MRA HEAD WITHOUT CONTRAST
TECHNIQUE: Multiplanar, multiecho pulse sequences of the brain and surrounding
structures were obtained without intravenous contrast. Angiographic
images of the head were obtained using MRA technique without
contrast.

[Series 1: 3d cow · axial · 0.5mm · 0.43mm/px · z∈[-108,-30]mm · 19 of 172 slices shown]
[im 1/172]
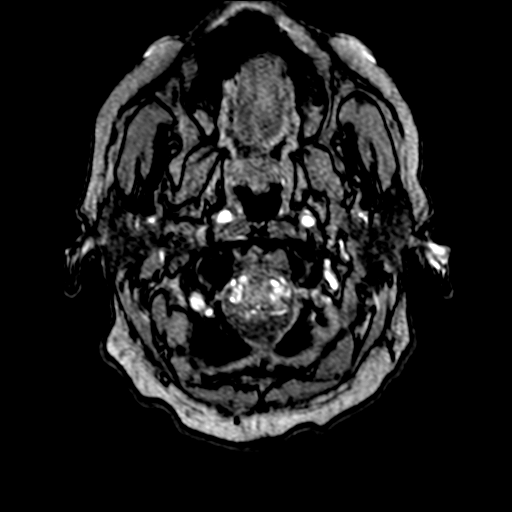
[im 4/172]
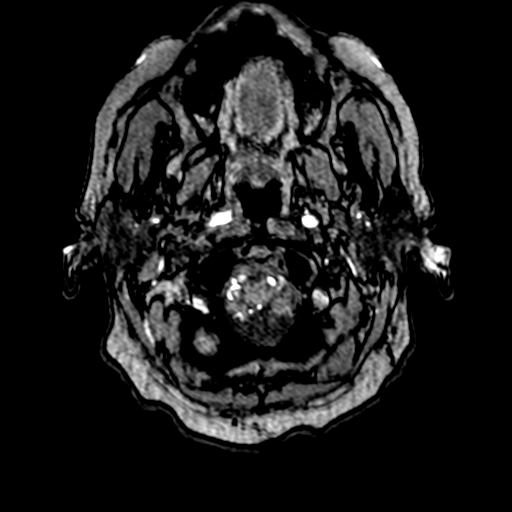
[im 8/172]
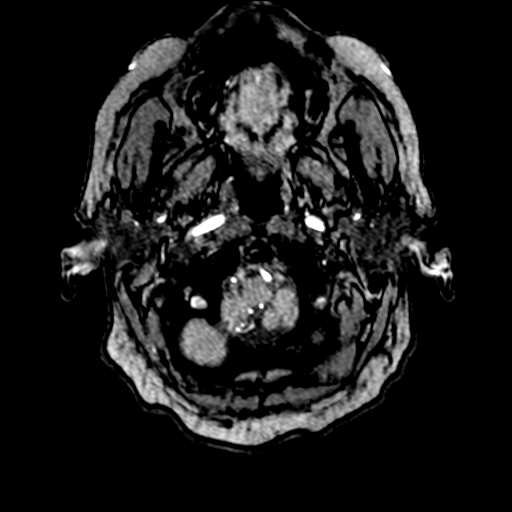
[im 11/172]
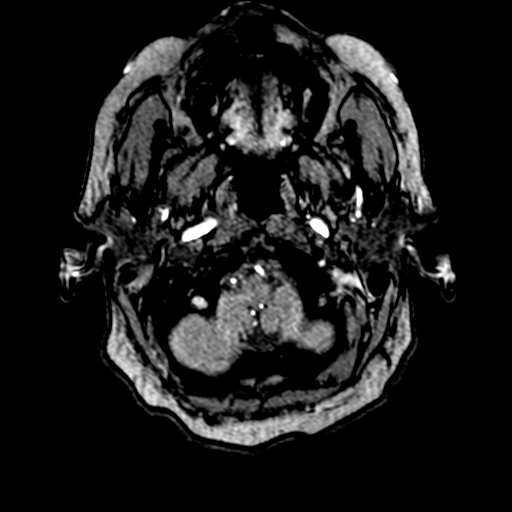
[im 15/172]
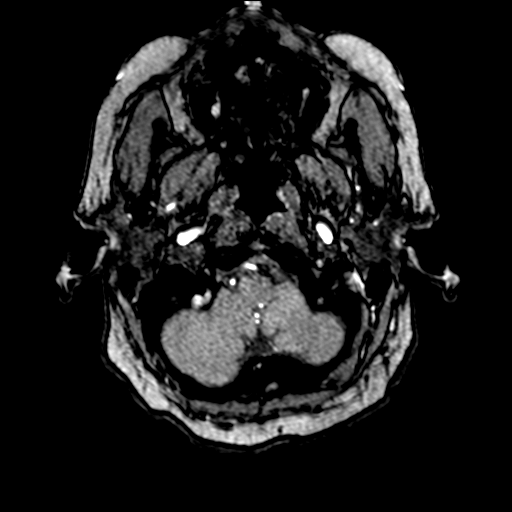
[im 19/172]
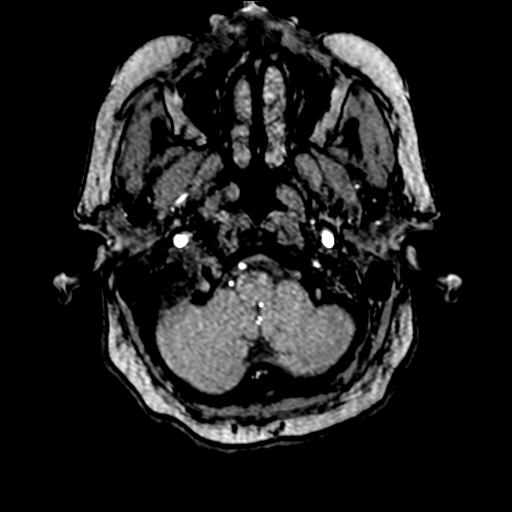
[im 22/172]
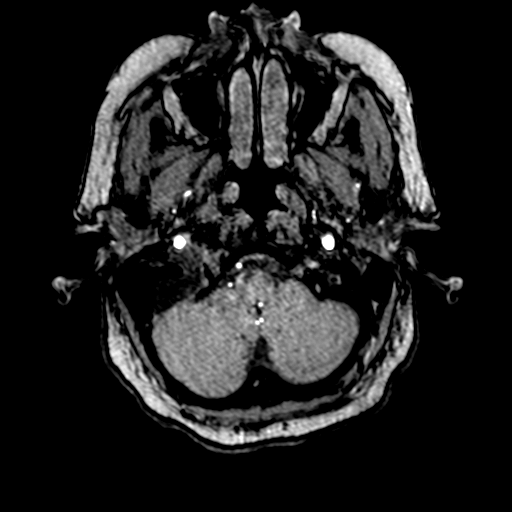
[im 26/172]
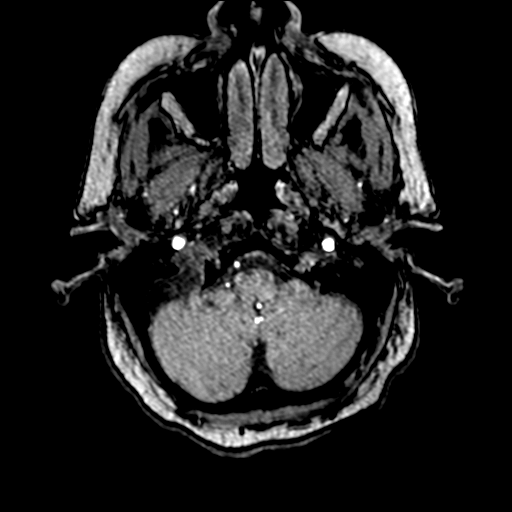
[im 30/172]
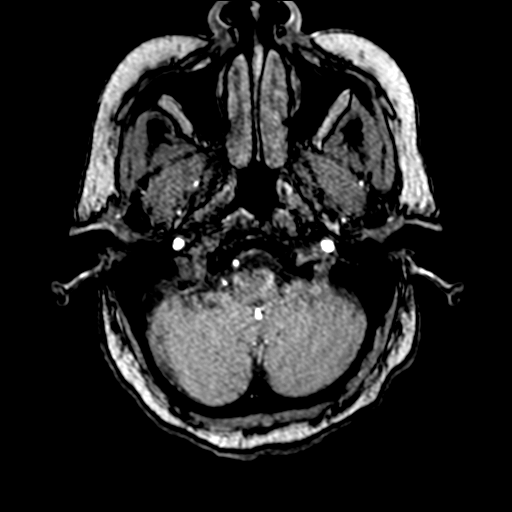
[im 33/172]
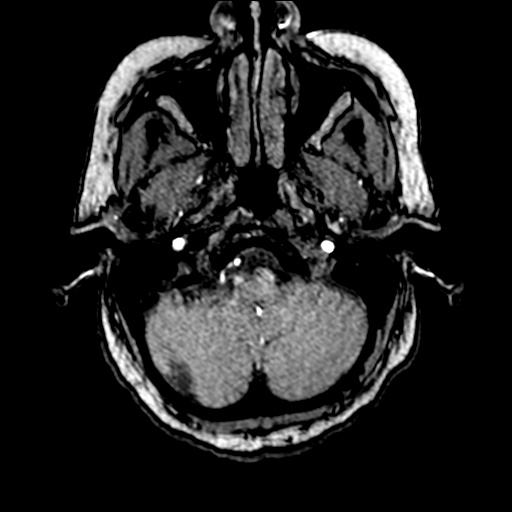
[im 37/172]
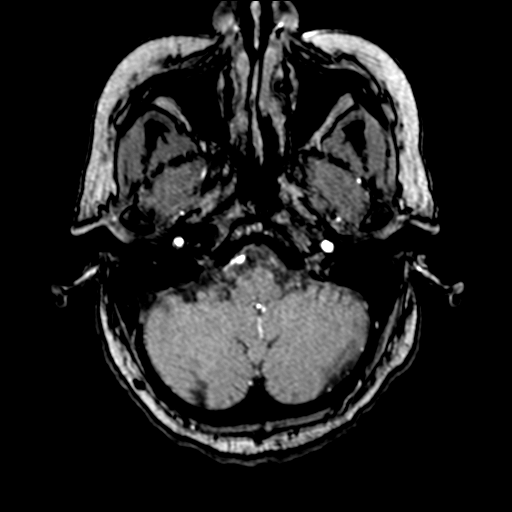
[im 55/172]
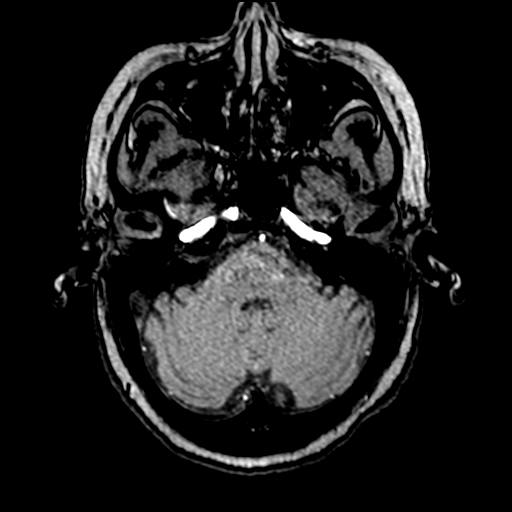
[im 77/172]
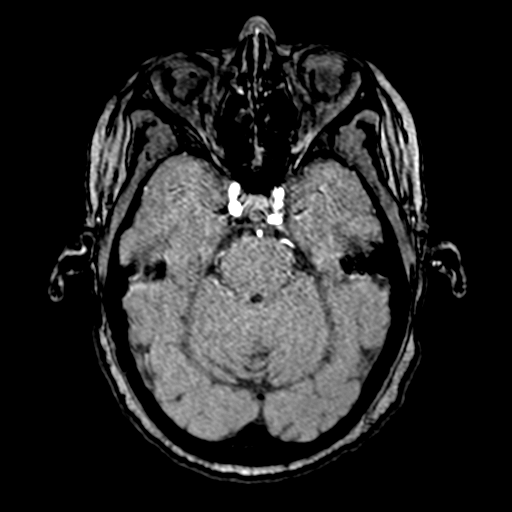
[im 88/172]
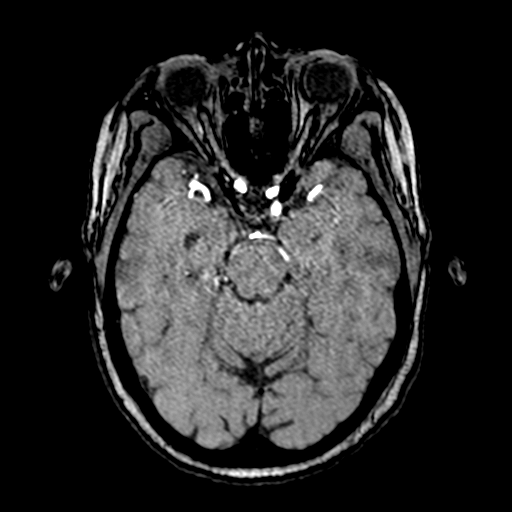
[im 99/172]
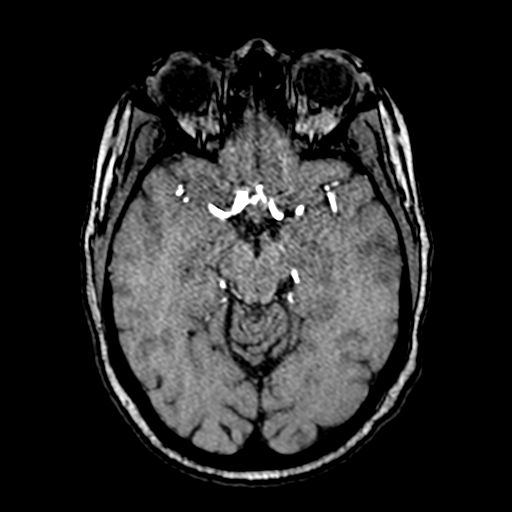
[im 121/172]
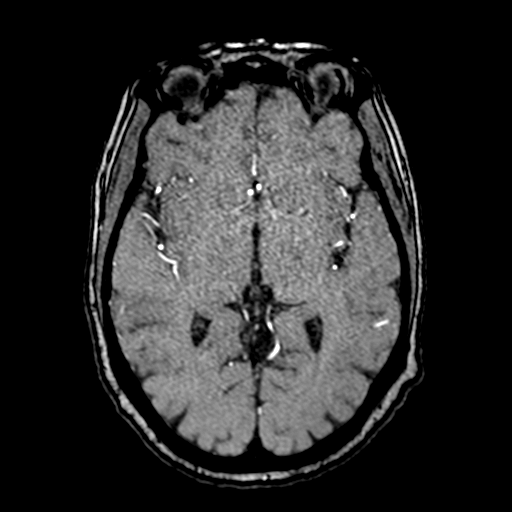
[im 142/172]
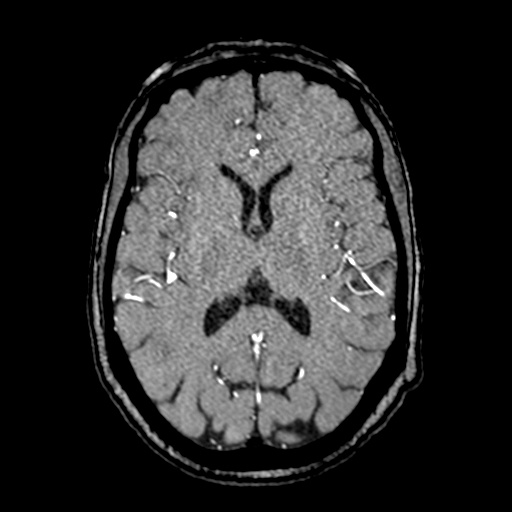
[im 146/172]
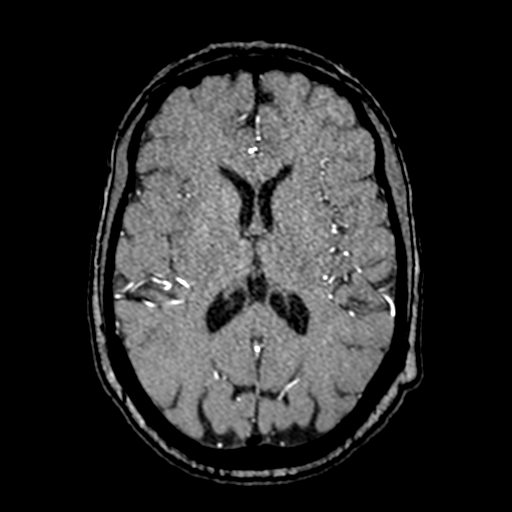
[im 164/172]
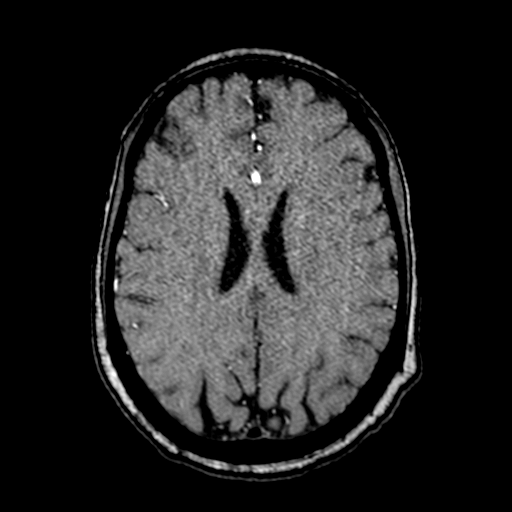

[19 of 48 positions shown; findings below may reference images not displayed]

FINDINGS: MRI HEAD FINDINGS

Brain: Ventricle size and cerebral volume normal. Negative for acute
infarct. Scattered small white matter hyperintensities bilaterally.
Hyperintensity in the pons bilaterally. Negative for hemorrhage or
mass.

Vascular: Normal arterial flow voids.

Skull and upper cervical spine: No focal skeletal lesion. Cervical
spondylosis.

Sinuses/Orbits: Mild mucosal edema paranasal sinuses. Bilateral
cataract surgery

Other: None

MRA HEAD FINDINGS

Both vertebral arteries patent to the basilar. PICA patent
bilaterally. Basilar widely patent. Basilar ends in the superior
cerebellar arteries which are patent bilaterally. Fetal origin of
the posterior cerebral artery bilaterally without stenosis

Internal carotid artery patent bilaterally. Anterior and middle
cerebral arteries patent without stenosis or large vessel occlusion.

Negative for cerebral aneurysm.
IMPRESSION: No acute intracranial abnormality. Mild chronic microvascular
ischemic change in the white matter and pons

Negative MRA head

## 2021-02-07 DIAGNOSIS — E538 Deficiency of other specified B group vitamins: Secondary | ICD-10-CM | POA: Diagnosis not present

## 2021-02-25 DIAGNOSIS — E1151 Type 2 diabetes mellitus with diabetic peripheral angiopathy without gangrene: Secondary | ICD-10-CM | POA: Diagnosis not present

## 2021-02-25 DIAGNOSIS — B351 Tinea unguium: Secondary | ICD-10-CM | POA: Diagnosis not present

## 2021-02-25 DIAGNOSIS — L603 Nail dystrophy: Secondary | ICD-10-CM | POA: Diagnosis not present

## 2021-02-25 DIAGNOSIS — L84 Corns and callosities: Secondary | ICD-10-CM | POA: Diagnosis not present

## 2021-03-07 DIAGNOSIS — E538 Deficiency of other specified B group vitamins: Secondary | ICD-10-CM | POA: Diagnosis not present

## 2021-04-09 DIAGNOSIS — E538 Deficiency of other specified B group vitamins: Secondary | ICD-10-CM | POA: Diagnosis not present

## 2021-04-22 DIAGNOSIS — H401123 Primary open-angle glaucoma, left eye, severe stage: Secondary | ICD-10-CM | POA: Diagnosis not present

## 2021-04-22 DIAGNOSIS — H02886 Meibomian gland dysfunction of left eye, unspecified eyelid: Secondary | ICD-10-CM | POA: Diagnosis not present

## 2021-04-22 DIAGNOSIS — Z961 Presence of intraocular lens: Secondary | ICD-10-CM | POA: Diagnosis not present

## 2021-04-22 DIAGNOSIS — H02883 Meibomian gland dysfunction of right eye, unspecified eyelid: Secondary | ICD-10-CM | POA: Diagnosis not present

## 2021-05-09 DIAGNOSIS — E538 Deficiency of other specified B group vitamins: Secondary | ICD-10-CM | POA: Diagnosis not present

## 2021-06-09 DIAGNOSIS — E538 Deficiency of other specified B group vitamins: Secondary | ICD-10-CM | POA: Diagnosis not present

## 2021-07-10 DIAGNOSIS — E538 Deficiency of other specified B group vitamins: Secondary | ICD-10-CM | POA: Diagnosis not present

## 2021-07-29 DIAGNOSIS — R600 Localized edema: Secondary | ICD-10-CM | POA: Diagnosis not present

## 2021-07-29 DIAGNOSIS — E1169 Type 2 diabetes mellitus with other specified complication: Secondary | ICD-10-CM | POA: Diagnosis not present

## 2021-07-29 DIAGNOSIS — I129 Hypertensive chronic kidney disease with stage 1 through stage 4 chronic kidney disease, or unspecified chronic kidney disease: Secondary | ICD-10-CM | POA: Diagnosis not present

## 2021-07-29 DIAGNOSIS — Z23 Encounter for immunization: Secondary | ICD-10-CM | POA: Diagnosis not present

## 2021-08-06 DIAGNOSIS — G4733 Obstructive sleep apnea (adult) (pediatric): Secondary | ICD-10-CM | POA: Diagnosis not present

## 2021-08-06 DIAGNOSIS — E1169 Type 2 diabetes mellitus with other specified complication: Secondary | ICD-10-CM | POA: Diagnosis not present

## 2021-08-06 DIAGNOSIS — E782 Mixed hyperlipidemia: Secondary | ICD-10-CM | POA: Diagnosis not present

## 2021-08-06 DIAGNOSIS — N183 Chronic kidney disease, stage 3 unspecified: Secondary | ICD-10-CM | POA: Diagnosis not present

## 2021-08-06 DIAGNOSIS — Z0000 Encounter for general adult medical examination without abnormal findings: Secondary | ICD-10-CM | POA: Diagnosis not present

## 2021-08-06 DIAGNOSIS — D860 Sarcoidosis of lung: Secondary | ICD-10-CM | POA: Diagnosis not present

## 2021-08-06 DIAGNOSIS — K219 Gastro-esophageal reflux disease without esophagitis: Secondary | ICD-10-CM | POA: Diagnosis not present

## 2021-08-06 DIAGNOSIS — I129 Hypertensive chronic kidney disease with stage 1 through stage 4 chronic kidney disease, or unspecified chronic kidney disease: Secondary | ICD-10-CM | POA: Diagnosis not present

## 2021-08-13 ENCOUNTER — Other Ambulatory Visit: Payer: Self-pay | Admitting: Obstetrics and Gynecology

## 2021-08-13 DIAGNOSIS — Z1231 Encounter for screening mammogram for malignant neoplasm of breast: Secondary | ICD-10-CM

## 2021-08-14 ENCOUNTER — Other Ambulatory Visit: Payer: Self-pay | Admitting: Family Medicine

## 2021-08-14 DIAGNOSIS — E2839 Other primary ovarian failure: Secondary | ICD-10-CM

## 2021-08-28 ENCOUNTER — Telehealth: Payer: Self-pay | Admitting: Registered"

## 2021-08-28 ENCOUNTER — Other Ambulatory Visit: Payer: Self-pay

## 2021-08-28 ENCOUNTER — Encounter: Payer: Medicare Other | Attending: Family Medicine | Admitting: Registered"

## 2021-08-28 DIAGNOSIS — E119 Type 2 diabetes mellitus without complications: Secondary | ICD-10-CM | POA: Insufficient documentation

## 2021-08-28 NOTE — Progress Notes (Addendum)
Diabetes Self-Management Education  Visit Type: First/Initial  Appt. Start Time: 1020 Appt. End Time: 1150  08/28/21  [name], identified by name and date of birth, is a 70 y.o. female with a diagnosis of Diabetes: Type 2. 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 Pt saw dietitian Wendy Downs, RDN, LDN in 2021.   ASSESSMENT  There were no vitals taken for this visit. There is no height or weight on file to calculate BMI.  Pt was dx with IBS over past year. Reports last year had a lot of constipation. 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 Reports then started a probiotic and since then has had diarrhea often. Reports cannot have artificial sweeteners, sugar or berries. Also feels certain nuts, specifically walnuts cause diarrhea. Reports as soon as she starts eating them she feels need to have diarrhea. Pt reports she eats 2-3 meals per day. Reports wakes around 6 AM but won't eat often until afternoon. Needs help with a planning a diet she can stick with and wants to lose 30 lb. Reports was weighing and measuring foods but not lately. Has been a part of different wt loss groups. Reports was in [employer]. Reports has been part of some low carb programs and doesn't include pasta and bread regularly but no longer avoiding them completley. Drinks 4 bottles water and some decaffeinated teas, sometimes coffee but not as often. Feels coffee elevates blood pressure and feels it makes her want dessert because she used to have it with dessert. Reports she always has loved "junk" food and dessert but tries to stay away from them.   Pt reports she was unable to have lab work done recently due to being too dehydrated. Denies regular headaches, reports sometimes feeling off balance but not often and reports may be due to shoes. Reports a few weeks ago felt "woosey" and checked blood sugar and  it was around 80. Went to doctor the next day and said may have been dehydrated. Reports some days has diarrhea all day and other days may not have it at all. Reports she feels she has it more often than not. Reports sometimes afraid to eat out due to fear of having diarrhea.   SMBG: Pt doesn't usually check unless feeling symptoms of low. Today fasting was 85.   Pertinent Lab Values: 12/09/20:  HgbA1c: 5.6 WNL Uric Acid: 7.2 H eGFR: 38 AAeGFR: 46  Other Signs/Symptoms: Reports sometimes feeling off balance but feels may be due to shoes rather than physical issue. No other concerns reported today.   GI: Pt reports having diarrhea, not daily but more days than not. Pt dx with IBS. Pt also with hx of bariatric surgery (stomach stapled).   Food Allergies/Intolerances: kiwi allergy; lactose intolerance. Pt reports diarrhea with sugar, artificial sugars, berries and walnuts. Pt also avoid nitrates and rice.   Medications: See list.   Supplements: tumeric; receives monthly B12 shots; no other vitamin or mineral supplements currently. Was taking vitamin D and vitamin C previously.    Diabetes Self-Management Education - 08/28/21 1029       Visit Information   Visit Type First/Initial      Initial Visit   Diabetes Type Type 2    Are you currently following a meal plan? --   Avoiding high sugar foods.   Are you taking your medications as prescribed? Yes  Date Diagnosed 2019      Health Coping   How would you rate your overall health? Good      Psychosocial Assessment   Patient Belief/Attitude about Diabetes Motivated to manage diabetes    Self-care barriers Impaired vision    Self-management support Doctor's office    Other persons present Patient;Family Member    Patient Concerns Nutrition/Meal planning;Weight Control    Special Needs Large print   Some visual impairment   Preferred Learning Style No preference indicated    Learning Readiness Ready    How often do you need to  have someone help you when you read instructions, pamphlets, or other written materials from your doctor or pharmacy? 1 - Never    What is the last grade level you completed in school? Some college      Pre-Education Assessment   Patient understands the diabetes disease and treatment process. Needs Review    Patient understands incorporating nutritional management into lifestyle. Needs Review    Patient undertands incorporating physical activity into lifestyle. Needs Review    Patient understands using medications safely. Demonstrates understanding / competency    Patient understands monitoring blood glucose, interpreting and using results Demonstrates understanding / competency    Patient understands prevention, detection, and treatment of acute complications. Needs Review    Patient understands prevention, detection, and treatment of chronic complications. Demonstrates understanding / competency    Patient understands how to develop strategies to address psychosocial issues. Demonstrates understanding / competency    Patient understands how to develop strategies to promote health/change behavior. Demonstrates understanding / competency      Complications   Last HgB A1C per patient/outside source 5.6 %    How often do you check your blood sugar? 0 times/day (not testing)    Fasting Blood glucose range (mg/dL) 70-129   Pt is not currently checking blood sugar.   Postprandial Blood glucose range (mg/dL) --   Pt doesn't usually check blood sugar postprandially.   Number of hypoglycemic episodes per month 0    Number of hyperglycemic episodes per week 0    Have you had a dilated eye exam in the past 12 months? Yes    Have you had a dental exam in the past 12 months? --    Are you checking your feet? --      Dietary Intake   Breakfast 4 strips bacon, 1 scrambled egg with olive oil spray and half oz cheddar cheese, 1 oz oatmeal before cooking, cooked apples, cup of green tea with lemon   11 AM    Snack (afternoon) popcorn, BBQ chips   5 PM   Dinner BBQ chips, roasted peanuts, less than 1 oz cheese, 6 Townhouse crackers, 2 bananas, water   8 PM   Beverage(s) green tea, water      Exercise   Exercise Type Light (walking / raking leaves);ADL's    How many days per week to you exercise? --   No set amount of days or minutes.     Patient Education   Previous Diabetes Education Yes (please comment)   Several years prior.   Nutrition management  Carbohydrate counting;Reviewed blood glucose goals for pre and post meals and how to evaluate the patients' food intake on their blood glucose level.;Role of diet in the treatment of diabetes and the relationship between the three main macronutrients and blood glucose level    Physical activity and exercise  Role of exercise on diabetes management, blood pressure control  and cardiac health.    Monitoring Purpose and frequency of SMBG.;Identified appropriate SMBG and/or A1C goals.    Acute complications Taught treatment of hypoglycemia - the 15 rule.    Chronic complications Relationship between chronic complications and blood glucose control      Individualized Goals (developed by patient)   Nutrition Follow meal plan discussed;General guidelines for healthy choices and portions discussed   Add multivitamin and calcium supplement   Physical Activity Exercise 3-5 times per week;15 minutes per day;30 minutes per day;Exercise 1-2 times per week      Post-Education Assessment   Patient understands the diabetes disease and treatment process. Demonstrates understanding / competency    Patient understands incorporating nutritional management into lifestyle. Demonstrates understanding / competency    Patient undertands incorporating physical activity into lifestyle. Demonstrates understanding / competency    Patient understands using medications safely. Demonstrates understanding / competency    Patient understands monitoring blood glucose, interpreting  and using results Demonstrates understanding / competency    Patient understands prevention, detection, and treatment of acute complications. Demonstrates understanding / competency    Patient understands prevention, detection, and treatment of chronic complications. Demonstrates understanding / competency    Patient understands how to develop strategies to address psychosocial issues. Demonstrates understanding / competency    Patient understands how to develop strategies to promote health/change behavior. Demonstrates understanding / competency      Outcomes   Expected Outcomes Demonstrated interest in learning. Expect positive outcomes    Future DMSE 4-6 wks    Program Status Completed             Individualized Plan for Diabetes Self-Management Training:   Learning Objective:  Patient will have a greater understanding of diabetes self-management. Patient education plan is to attend individual and/or group sessions per assessed needs and concerns.   Plan: Dietitian provided review of relationship between nutrition and blood sugar, blood sugar goals, symptoms and treatment of low blood sugar, carbohydrate counting, and benefits of activity. Provided education regarding balanced nutrition for management of pt's chronic conditions. Discussed importance of eating consistently for blood sugar management and also IBS management. Discussed common triggers for IBS. Discussed need for supplementation with pt's history of bariatric surgery (stomach stapling in 1980s). Emailed handout of recommended multivitamins and calcium regimen to pt following visit. Recommend using food and symptom log to help identify foods that may be contributing to IBS diarrhea. Feel likely changing eating schedule to more consistent meals alone may greatly improve symptoms as well. Also recommend pt have labs updated and also include labs for zinc, B12, thiamin as well as A1c, CBC, and BMP to assess for possible nutrient  deficiencies given pt's hx of bariatric surgery without consistent supplementation and ongoing diarrhea which could be a symptom of deficiency and/or increase risk of deficiency. Pt appeared agreeable to information/goals discussed.   Instructions/Goals:  Eat every 3-5 hours. Recommend 2-3 carbohydrate servings at each meal. For snacks, recommend 1 carbohydrate serving.   If having any dizziness or feeling off, check blood sugar. If 70 or below consume half cup juice and recheck in 15 minutes. Repeat if still 70 or less.   Continue with minimum of 4 bottles water daily. If having diarrhea, try for at least 5 bottles water. If having very frequent may include Gatorade or Pedialyte if tolerated.    Recommend food and symptom tracking for at least 2 weeks to assess for any associations between diarrhea and food intake. If associations are found will  discuss FODMAP diet.   Make physical activity a part of your week. Try to include at least 30 minutes of physical activity 5 days each week or at least 150 minutes per week. Regular physical activity promotes overall health-including helping to reduce risk for heart disease and diabetes, promoting mental health, and helping [place] sleep better.    Recommend trying walking videos on You Tube to add in activity.  Patient Instructions  Instructions/Goals:  Eat every 3-5 hours. Recommend 2-3 carbohydrate servings at each meal. For snacks, recommend 1 carbohydrate serving.   If having any dizziness or feeling off, check blood sugar. If 70 or below consume half cup juice and recheck in 15 minutes. Repeat if still 70 or less.   Continue with minimum of 4 bottles water daily. If having diarrhea, try for at least 5 bottles water. If having very frequent may include Gatorade or Pedialyte if tolerated.    Recommend food and symptom tracking for at least 2 weeks to assess for any associations between diarrhea and food intake. If associations are found will discuss  FODMAP diet.   Make physical activity a part of your week. Try to include at least 30 minutes of physical activity 5 days each week or at least 150 minutes per week. Regular physical activity promotes overall health-including helping to reduce risk for heart disease and diabetes, promoting mental health, and helping [place] sleep better.    Recommend trying walking videos on You Tube to add in activity.  Expected Outcomes:  Demonstrated interest in learning. Expect positive outcomes  Education material provided: ADA - How to Thrive: A Guide for Your Journey with Diabetes, Meal plan card, My Plate, and Snack sheet  If problems or questions, patient to contact team via:  Phone and Email  Future DSME appointment: 4-6 wks

## 2021-08-28 NOTE — Patient Instructions (Addendum)
Instructions/Goals:  Eat every 3-5 hours. Recommend 2-3 carbohydrate servings at each meal. For snacks, recommend 1 carbohydrate serving.   If having any dizziness or feeling off, check blood sugar. If 70 or below consume half cup juice and recheck in 15 minutes. Repeat if still 70 or less.   Continue with minimum of 4 bottles water daily. If having diarrhea, try for at least 5 bottles water. If having very frequent may include Gatorade or Pedialyte if tolerated.    Recommend food and symptom tracking for at least 2 weeks to assess for any associations between diarrhea and food intake. If associations are found will discuss FODMAP diet.   Make physical activity a part of your week. Try to include at least 30 minutes of physical activity 5 days each week or at least 150 minutes per week. Regular physical activity promotes overall health-including helping to reduce risk for heart disease and diabetes, promoting mental health, and helping [place] sleep better.    Recommend trying walking videos on You Tube to add in activity.

## 2021-08-28 NOTE — Telephone Encounter (Addendum)
Dietitian called pt to follow up regarding supplement recommendations. No answer. LVM for pt to return call at earliest convenience.

## 2021-09-02 DIAGNOSIS — H401123 Primary open-angle glaucoma, left eye, severe stage: Secondary | ICD-10-CM | POA: Diagnosis not present

## 2021-09-06 ENCOUNTER — Encounter: Payer: Self-pay | Admitting: Registered"

## 2021-09-06 NOTE — Telephone Encounter (Addendum)
On 11/17 pt returned dietitian's phone call. Dietitian let pt know about need for specialized supplementation due to hx of bariatric surgery and recommendation for additional lab work (B12, thiamin, and zinc) in addition to A1c, BMP, and CBC to assess for possible deficiencies. Dietitian let pt know she will email handout on supplement recommendations to pt and let her physician know about concern about possible nutrient deficiencies and about lab work. Pt was agreeable to information and had no further questions at this time.

## 2021-09-08 DIAGNOSIS — E782 Mixed hyperlipidemia: Secondary | ICD-10-CM | POA: Diagnosis not present

## 2021-09-08 DIAGNOSIS — E538 Deficiency of other specified B group vitamins: Secondary | ICD-10-CM | POA: Diagnosis not present

## 2021-09-08 DIAGNOSIS — E1169 Type 2 diabetes mellitus with other specified complication: Secondary | ICD-10-CM | POA: Diagnosis not present

## 2021-09-16 ENCOUNTER — Telehealth: Payer: Self-pay | Admitting: Registered"

## 2021-09-16 ENCOUNTER — Encounter: Payer: Self-pay | Admitting: Registered"

## 2021-09-16 NOTE — Telephone Encounter (Signed)
Dietitian returned pt's phone message regarding request to resend emailed document. Pt reports she lost emails over the past week including email from dietitian regarding supplement and lab work recommendations. Dietitian resent information via email and pt confirmed she received email. Pt reports her doctor did not receive information about requested labs by dietitian. Dietitian let me pt know she sent message via Epic and also sent visit note but can fax over to doctor if that may work better. Dietitian emphasized importance of supplement and lab work. Pt agreeable with plan. Pt has no other questions or concerns at this time.

## 2021-09-23 DIAGNOSIS — K580 Irritable bowel syndrome with diarrhea: Secondary | ICD-10-CM | POA: Diagnosis not present

## 2021-09-23 DIAGNOSIS — R197 Diarrhea, unspecified: Secondary | ICD-10-CM | POA: Diagnosis not present

## 2021-09-25 DIAGNOSIS — Z780 Asymptomatic menopausal state: Secondary | ICD-10-CM | POA: Diagnosis not present

## 2021-10-02 DIAGNOSIS — R0982 Postnasal drip: Secondary | ICD-10-CM | POA: Diagnosis not present

## 2021-10-02 DIAGNOSIS — R0981 Nasal congestion: Secondary | ICD-10-CM | POA: Diagnosis not present

## 2021-10-02 DIAGNOSIS — J069 Acute upper respiratory infection, unspecified: Secondary | ICD-10-CM | POA: Diagnosis not present

## 2021-10-02 DIAGNOSIS — M7989 Other specified soft tissue disorders: Secondary | ICD-10-CM | POA: Diagnosis not present

## 2021-10-02 DIAGNOSIS — Z03818 Encounter for observation for suspected exposure to other biological agents ruled out: Secondary | ICD-10-CM | POA: Diagnosis not present

## 2021-10-07 DIAGNOSIS — E538 Deficiency of other specified B group vitamins: Secondary | ICD-10-CM | POA: Diagnosis not present

## 2021-10-07 DIAGNOSIS — Z9884 Bariatric surgery status: Secondary | ICD-10-CM | POA: Diagnosis not present

## 2021-10-09 DIAGNOSIS — Z9884 Bariatric surgery status: Secondary | ICD-10-CM | POA: Diagnosis not present

## 2021-10-09 DIAGNOSIS — E538 Deficiency of other specified B group vitamins: Secondary | ICD-10-CM | POA: Diagnosis not present

## 2021-11-04 ENCOUNTER — Encounter: Payer: Medicare Other | Attending: Family Medicine | Admitting: Registered"

## 2021-11-04 ENCOUNTER — Other Ambulatory Visit: Payer: Self-pay

## 2021-11-04 DIAGNOSIS — E1169 Type 2 diabetes mellitus with other specified complication: Secondary | ICD-10-CM | POA: Diagnosis not present

## 2021-11-04 NOTE — Progress Notes (Signed)
Diabetes Self-Management Education  Visit Type:    Appt. Start Time: 1145 Appt. End Time: 1215  11/04/21  [name], identified by name and date of birth, is a 71 y.o. female with a diagnosis of Diabetes:  . 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   ASSESSMENT  There were no vitals taken for this visit. There is no height or weight on file to calculate BMI.  Pt reports she was put on a new medication (pt cannot find name of it) to control her diarrhea but had to discontinue it due to side effects. Pt reports she often has trouble with tolerating medications. Pt reports not having a solid stool often but no longer getting diarrhea as often. Reports trying to stay away from the problem foods and now having much improvement. Reports no longer having coffee or tea in the morning lately. May have diarrhea 1 time weekly now and typically if she does have diarrhea much less frequent and not often all day like was common before.   Pt reports eating more consistently as well, now having 3 meals per day. Reports over the past week she has been doing a fast with her church which includes only eating fruits, vegetables, walnuts (no longer having trouble with those), eggs, and seafood and cannot have sweets, dairy, meats, or grains. Reports she was 208 lb when she started the fast and 205.9 this morning. Reports she also cannot eat after 7 pm while on fast. The fast is through this Sunday. Reports having the cut off time for eating in the evening has helped her not eat late at night and no longer eating in the bed or while reading like before. Reports doing pretty good with fluids and avoiding dehydration. Reports 2 bottles water so far today. Pt had lab work completed as discussed at last appointment. Reports her doctor said all was WNL except vitamin D and recommended OTC vitamin D supplement. Reports she hasn't gotten  any other supplements as discussed at last visit yet due to cost.   Pt has questions regarding how to purchase whole grains using the food label. Pt would also like to know if she could try plant based yogurts.   Pt reports she has been trying to walk more around inside her home and up and down the stairs. Reports this works well for her and much better than walking outdoors where it is hard for her due to her visual impairment.   SMBG: Pt doesn't usually check unless feeling symptoms of low. Reports blood sugar this morning was 82 was feeling a little lightheaded. Reports no longer feeling this way after eating.   Pertinent Lab Values: 12/09/20:  HgbA1c: 5.6 WNL Uric Acid: 7.2 H eGFR: 72 AAeGFR: 46  Other Signs/Symptoms: Pt reports her hair is now growing better than in the past.  Reports energy level is better now.   GI: Pt reports improvement in diarrhea, may have it 1 time weekly now and when it occurs typically not as frequent as before.   Food Allergies/Intolerances: kiwi allergy; lactose intolerance. Pt reports diarrhea with sugar, artificial sugars, and berries. Pt also avoid nitrates and rice.   Medications: See list.   Supplements: tumeric; receives monthly B12 shots; no other vitamin or mineral supplements currently. Was taking vitamin D and vitamin C previously. Plans to start vitamin D supplement OTC.   24 Hour Recall: Breakfast: boiled  eggs, grits, salmon cake  Snack:  Lunch: banana, potato soup (few spoons), honey dew melon Snack:  Dinner: turnips, half sweet potato, pinto beans  Snack:  Beverages: around 4 bottles water   Individualized Plan for Diabetes Self-Management Training:   Learning Objective:  Patient will have a greater understanding of diabetes self-management. Patient education plan is to attend individual and/or group sessions per assessed needs and concerns.   Plan: Dietitian praised pt for working to have 3 meals per day. Discussed how eating  consistently throughout the day and cutting out coffee and tea is likely why pt is having improvement in diarrhea. Discussed spacing beverages apart from meals may also help. Discussed how to look for whole grains using the nutrition label. Discussed recommended supplements due to history of bariatric surgery, discussed starting calcium is especially important with pt having low vitamin D. Pt appeared agreeable to information/goals discussed.   Instructions/Goals:  Eat every 3-5 hours. Recommend 2-3 carbohydrate servings at each meal. For snacks, recommend 1 carbohydrate serving and a protein.   If having any dizziness or feeling off, check blood sugar. If 70 or below consume half cup juice and recheck in 15 minutes. Repeat if still 70 or less.   Continue with minimum of 4-5 bottles water daily. If having diarrhea, try for at least 5 bottles water. If having very frequent may include Gatorade or Pedialyte if tolerated.  Avoid fluids 15 minutes before, during, and 30 minutes after meals  Foods to Try:  May try Silk almondmilk unflavored yogurt-can add nuts and fruit to flavor and increase protein.  For grains, look for the word "whole" as first ingredient on ingredients label   Make physical activity a part of your week. Try to include at least 30 minutes of physical activity 5 days each week or at least 150 minutes per week. Regular physical activity promotes overall health-including helping to reduce risk for heart disease and diabetes, promoting mental health, and helping [place] sleep better.    Add in extra walking when at home. Try for adding 10 minutes daily and working from there.   Supplements:  Bariatric multivitamin (See list of those that meet needs) Vitamin D as recommended by your doctor Calcium 500 mg x 3 daily spaced 2 hours apart and spaced 2 hours from multivitamin. Can take with vitamin D.   Patient Instructions  Instructions/Goals:  Eat every 3-5 hours. Recommend 2-3  carbohydrate servings at each meal. For snacks, recommend 1 carbohydrate serving and a protein.   If having any dizziness or feeling off, check blood sugar. If 70 or below consume half cup juice and recheck in 15 minutes. Repeat if still 70 or less.   Continue with minimum of 4-5 bottles water daily. If having diarrhea, try for at least 5 bottles water. If having very frequent may include Gatorade or Pedialyte if tolerated.  Avoid fluids 15 minutes before, during, and 30 minutes after meals  Foods to Try:  May try Silk almondmilk unflavored yogurt-can add nuts and fruit to flavor and increase protein.  For grains, look for the word "whole" as first ingredient on ingredients label   Make physical activity a part of your week. Try to include at least 30 minutes of physical activity 5 days each week or at least 150 minutes per week. Regular physical activity promotes overall health-including helping to reduce risk for heart disease and diabetes, promoting mental health, and helping [place] sleep better.    Add in extra walking when at  home. Try for adding 10 minutes daily and working from there.   Supplements:  Bariatric multivitamin (See list of those that meet needs) Vitamin D as recommended by your doctor Calcium 500 mg x 3 daily spaced 2 hours apart and spaced 2 hours from multivitamin. Can take with vitamin D.   Expected Outcomes:   Expect good outcomes.   Education material provided: Post Bariatric Surgery Supplement Handout.   If problems or questions, patient to contact team via:  Phone and Email  Future DSME appointment:  1-2 months follow-up.

## 2021-11-04 NOTE — Patient Instructions (Addendum)
Instructions/Goals:  Eat every 3-5 hours. Recommend 2-3 carbohydrate servings at each meal. For snacks, recommend 1 carbohydrate serving and a protein.   If having any dizziness or feeling off, check blood sugar. If 70 or below consume half cup juice and recheck in 15 minutes. Repeat if still 70 or less.   Continue with minimum of 4-5 bottles water daily. If having diarrhea, try for at least 5 bottles water. If having very frequent may include Gatorade or Pedialyte if tolerated.  Avoid fluids 15 minutes before, during, and 30 minutes after meals  Foods to Try:  May try Silk almondmilk unflavored yogurt-can add nuts and fruit to flavor and increase protein.  For grains, look for the word "whole" as first ingredient on ingredients label   Make physical activity a part of your week. Try to include at least 30 minutes of physical activity 5 days each week or at least 150 minutes per week. Regular physical activity promotes overall health-including helping to reduce risk for heart disease and diabetes, promoting mental health, and helping [place] sleep better.    Add in extra walking when at home. Try for adding 10 minutes daily and working from there.   Supplements:  Bariatric multivitamin (See list of those that meet needs) Vitamin D as recommended by your doctor Calcium 500 mg x 3 daily spaced 2 hours apart and spaced 2 hours from multivitamin. Can take with vitamin D.

## 2021-11-05 DIAGNOSIS — M79642 Pain in left hand: Secondary | ICD-10-CM | POA: Diagnosis not present

## 2021-11-05 DIAGNOSIS — M720 Palmar fascial fibromatosis [Dupuytren]: Secondary | ICD-10-CM | POA: Diagnosis not present

## 2021-11-07 ENCOUNTER — Encounter: Payer: Self-pay | Admitting: Registered"

## 2021-11-07 DIAGNOSIS — E538 Deficiency of other specified B group vitamins: Secondary | ICD-10-CM | POA: Diagnosis not present

## 2021-11-10 ENCOUNTER — Ambulatory Visit
Admission: RE | Admit: 2021-11-10 | Discharge: 2021-11-10 | Disposition: A | Discharge status: Home / Self Care | Payer: Medicare Other | Source: Ambulatory Visit | Attending: Obstetrics and Gynecology | Admitting: Obstetrics and Gynecology

## 2021-11-10 DIAGNOSIS — Z1231 Encounter for screening mammogram for malignant neoplasm of breast: Secondary | ICD-10-CM | POA: Diagnosis not present

## 2021-11-11 ENCOUNTER — Encounter: Payer: Self-pay | Admitting: Registered"

## 2021-12-09 DIAGNOSIS — E538 Deficiency of other specified B group vitamins: Secondary | ICD-10-CM | POA: Diagnosis not present

## 2021-12-16 ENCOUNTER — Encounter: Payer: Medicare Other | Attending: Family Medicine | Admitting: Registered"

## 2021-12-16 ENCOUNTER — Other Ambulatory Visit: Payer: Self-pay

## 2021-12-16 DIAGNOSIS — E119 Type 2 diabetes mellitus without complications: Secondary | ICD-10-CM | POA: Insufficient documentation

## 2021-12-16 NOTE — Progress Notes (Unsigned)
Diabetes Self-Management Education  Visit Type:    Appt. Start Time: 1145 Appt. End Time: 1215  11/04/21  [name], identified by name and date of birth, is a 71 y.o. female with a diagnosis of Diabetes:  . 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   ASSESSMENT  There were no vitals taken for this visit. There is no height or weight on file to calculate BMI.  Pt reports she started a new blood pressure medication Verapamil 120 mg and she has been feeling more tired since starting. Reports yesterday her stomach was also feeling worse. Reports tried a blueberry silk yogurt and banana and felt better. Reports pain and swelling in legs, stinging in left side of back sporadic. Past m cholestyramine (didn't work well). In past was on amlodipine and caused similar symptoms. Reports otherwise stomach has been better. Reports yesterday was having a lot of bowel movements but not diarrhea. Reports diarrhea about once weekly-feels depends on what she eats. Reports for lent she gave up sugar. Reports since then has been good. Reports getting 3 meals has been hard due to bad habit up staying up late. Reports will be in the bed watching TV ([address] [employer], [place]), until 3 AM. Pt wakes around 6 AM then back to sleep and up again around 11 AM. Usually doesn't eat until about 130 AM.   Yogurt, banana. Prior has done oatmeal, walnuts, fruit (apple, blackberries) OR scrambled eggs, some bacon (uncured and low sodium).  Today 208.3 lb   Reports having reunion and class reunion  Reports last week was hard due to unexpected death of friend last week due to breast cancer.   Reports walking in doors and outdoors. Reports it was so nice outside.   Pt reports she was put on a new medication (pt cannot find name of it) to control her diarrhea but had to discontinue it due to side effects. Pt reports she often has trouble with  tolerating medications. Pt reports not having a solid stool often but no longer getting diarrhea as often. Reports trying to stay away from the problem foods and now having much improvement. Reports no longer having coffee or tea in the morning lately. May have diarrhea 1 time weekly now and typically if she does have diarrhea much less frequent and not often all day like was common before.   Pt reports eating more consistently as well, now having 3 meals per day. Reports over the past week she has been doing a fast with her church which includes only eating fruits, vegetables, walnuts (no longer having trouble with those), eggs, and seafood and cannot have sweets, dairy, meats, or grains. Reports she was 208 lb when she started the fast and 205.9 this morning. Reports she also cannot eat after 7 pm while on fast. The fast is through this Sunday. Reports having the cut off time for eating in the evening has helped her not eat late at night and no longer eating in the bed or while reading like before. Reports doing pretty good with fluids and avoiding dehydration. Reports 2 bottles water so far today. Pt had lab work completed as discussed at last appointment. Reports her doctor said all was WNL except vitamin D and recommended OTC vitamin D supplement. Reports she hasn't gotten any other supplements as discussed at last visit yet due to cost.   Pt has questions regarding how  to purchase whole grains using the food label. Pt would also like to know if she could try plant based yogurts.   Pt reports she has been trying to walk more around inside her home and up and down the stairs. Reports this works well for her and much better than walking outdoors where it is hard for her due to her visual impairment.   No symptoms of a low blood sugar.   SMBG: Pt doesn't usually check unless feeling symptoms of low. Reports blood sugar this morning was 82 was feeling a little lightheaded. Reports no longer feeling this  way after eating.   Pertinent Lab Values: 12/09/20:  HgbA1c: 5.6 WNL Uric Acid: 7.2 H eGFR: 28 AAeGFR: 46  Other Signs/Symptoms: Pt reports her hair is now growing better than in the past.  Reports energy level is better now.   GI: Pt reports improvement in diarrhea, may have it 1 time weekly now and when it occurs typically not as frequent as before.   Food Allergies/Intolerances: kiwi allergy; lactose intolerance. Pt reports diarrhea with sugar, artificial sugars, and berries. Pt also avoid nitrates and rice.   Medications: See list.   Supplements: tumeric; receives monthly B12 shots; no other vitamin or mineral supplements currently. Was taking vitamin D and vitamin C previously. Plans to start vitamin D supplement OTC.   24 Hour Recall: Final wake up around 11 AM Breakfast:  Snack:  Lunch 12 noon: couple strips bacon, air fried potatoes, egg, hot lemon water  Snack 430 PM: Silk yogurt, banana  (stomach issues subsided)  Dinner 7 PM: clam chowder soup, Cheez Its crackers  Snack: sourdough toast with peanut butter and a banana  Beverages: around 4 bottles water   Individualized Plan for Diabetes Self-Management Training:   Learning Objective:  Patient will have a greater understanding of diabetes self-management. Patient education plan is to attend individual and/or group sessions per assessed needs and concerns.   Plan: Dietitian praised pt for starting all recommended supplements. Encouraged pt to continue to let her doctor know about changes in symptoms since starting new medication. Discussed meal timing goals-eating within 1 hour of waking and every 3-5 hours after while awake and avoiding within 3 hours of laying down. Pt appeared agreeable to information/goals discussed.   Instructions/Goals:  Eat every 3-5 hours. Recommend 2-3 carbohydrate servings at each meal. For snacks, recommend 1 carbohydrate serving and a protein. Eat 1st meal within 1 hour of waking Eat every  3-5 hours while awake Avoid eating within 3 hours of laying down  Include balance at meals-protein, 2-3 carbohydrate servings, + half plate vegetables which are well tolerated.   If having any dizziness or feeling off, check blood sugar. If 70 or below consume half cup juice and recheck in 15 minutes. Repeat if still 70 or less.   Continue with minimum of 4-5 bottles water daily. If having diarrhea, try for at least 5 bottles water. If having very frequent may include Gatorade or Pedialyte if tolerated.  Avoid fluids 15 minutes before, during, and 30 minutes after meals. Great job with fluids!  Make physical activity a part of your week. Try to include at least 30 minutes of physical activity 5 days each week or at least 150 minutes per week. Regular physical activity promotes overall health-including helping to reduce risk for heart disease and diabetes, promoting mental health, and helping [place] sleep better.    Add in extra walking when at home. Try for adding 10 minutes daily  and working from there. Great job! Continue!  Supplements: Continue. Doing well! Bariatric multivitamin (See list of those that meet needs) Vitamin D as recommended by your doctor Calcium 500 mg x 3 daily spaced 2 hours apart and spaced 2 hours from multivitamin. Can take with vitamin D.   Patient Instructions  Instructions/Goals:  Eat every 3-5 hours. Recommend 2-3 carbohydrate servings at each meal. For snacks, recommend 1 carbohydrate serving and a protein. Eat 1st meal within 1 hour of waking Eat every 3-5 hours while awake Avoid eating within 3 hours of laying down  Include balance at meals-protein, 2-3 carbohydrate servings, + half plate vegetables which are well tolerated.   If having any dizziness or feeling off, check blood sugar. If 70 or below consume half cup juice and recheck in 15 minutes. Repeat if still 70 or less.   Continue with minimum of 4-5 bottles water daily. If having diarrhea, try for at  least 5 bottles water. If having very frequent may include Gatorade or Pedialyte if tolerated.  Avoid fluids 15 minutes before, during, and 30 minutes after meals. Great job with fluids!  Make physical activity a part of your week. Try to include at least 30 minutes of physical activity 5 days each week or at least 150 minutes per week. Regular physical activity promotes overall health-including helping to reduce risk for heart disease and diabetes, promoting mental health, and helping [place] sleep better.    Add in extra walking when at home. Try for adding 10 minutes daily and working from there. Great job! Continue!  Supplements: Continue. Doing well! Bariatric multivitamin (See list of those that meet needs) Vitamin D as recommended by your doctor Calcium 500 mg x 3 daily spaced 2 hours apart and spaced 2 hours from multivitamin. Can take with vitamin D.   Expected Outcomes:   Expect good outcomes.   Education material provided: None today.   If problems or questions, patient to contact team via:  Phone and Email  Future DSME appointment:  3 months follow-up.

## 2021-12-16 NOTE — Patient Instructions (Addendum)
Instructions/Goals: ? ?Eat every 3-5 hours. Recommend 2-3 carbohydrate servings at each meal. For snacks, recommend 1 carbohydrate serving and a protein. ?Eat 1st meal within 1 hour of waking ?Eat every 3-5 hours while awake ?Avoid eating within 3 hours of laying down ? ?Include balance at meals-protein, 2-3 carbohydrate servings, + half plate vegetables which are well tolerated.  ? ?If having any dizziness or feeling off, check blood sugar. If 70 or below consume half cup juice and recheck in 15 minutes. Repeat if still 70 or less.  ? ?Continue with minimum of 4-5 bottles water daily. If having diarrhea, try for at least 5 bottles water. If having very frequent may include Gatorade or Pedialyte if tolerated.  Avoid fluids 15 minutes before, during, and 30 minutes after meals. Great job with fluids! ? ?Make physical activity a part of your week. Try to include at least 30 minutes of physical activity 5 days each week or at least 150 minutes per week. Regular physical activity promotes overall health-including helping to reduce risk for heart disease and diabetes, promoting mental health, and helping [place] sleep better.    ?Add in extra walking when at home. Try for adding 10 minutes daily and working from there. Great job! Continue! ? ?Supplements: Continue. Doing well! ?Bariatric multivitamin (See list of those that meet needs) ?Vitamin D as recommended by your doctor ?Calcium 500 mg x 3 daily spaced 2 hours apart and spaced 2 hours from multivitamin. Can take with vitamin D.  ?

## 2021-12-23 ENCOUNTER — Encounter: Payer: Self-pay | Admitting: Registered"

## 2022-01-06 DIAGNOSIS — E538 Deficiency of other specified B group vitamins: Secondary | ICD-10-CM | POA: Diagnosis not present

## 2022-01-21 ENCOUNTER — Ambulatory Visit
Admission: RE | Admit: 2022-01-21 | Discharge: 2022-01-21 | Disposition: A | Discharge status: Home / Self Care | Payer: Medicare Other | Source: Ambulatory Visit | Attending: Family Medicine | Admitting: Family Medicine

## 2022-01-21 DIAGNOSIS — E2839 Other primary ovarian failure: Secondary | ICD-10-CM

## 2022-01-21 DIAGNOSIS — Z780 Asymptomatic menopausal state: Secondary | ICD-10-CM | POA: Diagnosis not present

## 2022-02-04 DIAGNOSIS — E782 Mixed hyperlipidemia: Secondary | ICD-10-CM | POA: Diagnosis not present

## 2022-02-04 DIAGNOSIS — M109 Gout, unspecified: Secondary | ICD-10-CM | POA: Diagnosis not present

## 2022-02-04 DIAGNOSIS — N183 Chronic kidney disease, stage 3 unspecified: Secondary | ICD-10-CM | POA: Diagnosis not present

## 2022-02-04 DIAGNOSIS — D508 Other iron deficiency anemias: Secondary | ICD-10-CM | POA: Diagnosis not present

## 2022-02-04 DIAGNOSIS — R7302 Impaired glucose tolerance (oral): Secondary | ICD-10-CM | POA: Diagnosis not present

## 2022-02-04 DIAGNOSIS — I10 Essential (primary) hypertension: Secondary | ICD-10-CM | POA: Diagnosis not present

## 2022-02-04 DIAGNOSIS — K219 Gastro-esophageal reflux disease without esophagitis: Secondary | ICD-10-CM | POA: Diagnosis not present

## 2022-03-03 DIAGNOSIS — H401123 Primary open-angle glaucoma, left eye, severe stage: Secondary | ICD-10-CM | POA: Diagnosis not present

## 2022-03-03 DIAGNOSIS — H401112 Primary open-angle glaucoma, right eye, moderate stage: Secondary | ICD-10-CM | POA: Diagnosis not present

## 2022-03-05 DIAGNOSIS — E538 Deficiency of other specified B group vitamins: Secondary | ICD-10-CM | POA: Diagnosis not present

## 2022-03-17 ENCOUNTER — Encounter: Payer: Medicare Other | Attending: Family Medicine | Admitting: Registered"

## 2022-03-17 DIAGNOSIS — E119 Type 2 diabetes mellitus without complications: Secondary | ICD-10-CM | POA: Insufficient documentation

## 2022-03-17 NOTE — Progress Notes (Signed)
Diabetes Self-Management Education  Visit Type:    Appt. Start Time: 1015 Appt. End Time: 1100  03/17/22  [name], identified by name and date of birth, is a 71 y.o. female with a diagnosis of Diabetes:  . 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   ASSESSMENT  There were no vitals taken for this visit. There is no height or weight on file to calculate BMI.   Pt reports she had a family reunion in [place] last weekend and went well. Also had class reunion in [place] since last appointment which went well.   Pt reports she has been feeling ok. Reports she tried IBS Clear (pt had heard about it from someone else) but felt it caused headaches so she discontinued. Reports she notices usually when she eats around 1 hour later will have to have a bowel movement and sometimes will be in the bathroom for 1 hour. Reports other times if she goes too long without eating she will have GI issues. Feels the diarrhea may have increased since last visit. Reports will then want comfort foods after having diarrhea when her stomach feels empty. Reports salads if eaten on empty stomach will cause issues. Feels at last visit when she was off high sugar foods it had improved. Reports during one time she was eating oatmeal cream pies. Yesterday threw out all the oatmeal cream pies in her home. Reports she cannot moderate sugar. 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 Reports diarrhea varies, some days may have 3 times. One night had ice cream and an oatmeal cookie and was having diarrhea all evening and early morning. Feels it was due to both. Notices if eating peanuts has issues as well. Reports she ate some watermelon last week and it caused GI distress as well.   Pt reports she has been having 2 regular meals and meal 3 is often junk foods. Has been using sourdough due to whole grain bothering her. Reports during May was doing a  lot of [place] (1 per day) .   Reports no stress lately. Reports drinking more water lately. Denies having any issues with water causing diarrhea.   Pt wants to know if it is ok to take Imodium regularly.   SMBG: Pt doesn't usually check unless feeling symptoms of low. No readings to report today. Denies any feelings of low blood sugar.   Pertinent Lab Values: 12/09/20:  HgbA1c: 5.6 WNL Uric Acid: 7.2 H eGFR: 88 AAeGFR: 46  Other Signs/Symptoms: Pt reports her hair is now growing better than in the past.  Reports energy level is better now.   GI: Pt reports having more frequent diarrhea than at last visit. May be due to increased intake of sugary foods.   Food Allergies/Intolerances: kiwi allergy; lactose intolerance. Pt reports diarrhea with sugar, artificial sugars, and berries. Pt also avoid nitrates and rice.   Medications: See list.   Supplements: tumeric; receives monthly B12 shots; OTC calcium 500 mg x 3 times daily, vitamin D, and bariatric multivitamin. Reports getting her supplements in most days.   24 Hour Recall: Breakfast 10 AM: sour dough bread toast, egg, cheese, 1 slice bacon, water (had a bowel movement but no diarrhea. Reports was in BR for 1 hour). Snack: None reported.  Lunch 4 PM: egg roll, wonton soup (took Imodium beforehand), leomonade  Snack: None reported.  Dinner 830 PM: Box of crunch and [place],  water Snack: None reported.  Beverages: [place] Lemonade 8 oz, water   Walking about 2 days per week.   Individualized Plan for Diabetes Self-Management Training:   Learning Objective:  Patient will have a greater understanding of diabetes self-management. Patient education plan is to attend individual and/or group sessions per assessed needs and concerns.   Plan: Discussed focusing in on eating schedule and having balance at meals to help with meeting needs and also IBS. Discussed working to limit foods high in sugar as these are likely resulting in  dumping syndrome for pt leading to diarrhea. Recommend pt talk with her doctor regarding questions about taking Imodium. Pt appeared agreeable to information/goals discussed.   Instructions/Goals:  Eat every 3-4 hours. Recommend 2-3 carbohydrate servings at each meal. For snacks, recommend 1 carbohydrate serving and a protein. Eat 1st meal within 1 hour of waking **Eat every 3-4 hours while awake** **Have balanced meals: protein + starch + veggies** Avoid eating within 3 hours of laying down **Limit eating sweets and sugar sweetened beverages as much as possible** **If having sweets, have with a balanced meal rather than alone.**  If having any dizziness or feeling off, check blood sugar. If 70 or below consume half cup juice and recheck in 15 minutes. Repeat if still 70 or less.   Continue with minimum of 4-5 bottles water daily. If having diarrhea, try for at least 5 bottles water. **If having very frequent diarrhea (multiple times in a day) sip Gatorade or Pedialyte if tolerated.**  Avoid fluids 15 minutes before, during, and 30 minutes after meals.  Make physical activity a part of your week. Try to include at least 30 minutes of physical activity 5 days each week or at least 150 minutes per week. Regular physical activity promotes overall health-including helping to reduce risk for heart disease and diabetes, promoting mental health, and helping [place] sleep better.    Continue trying to work in 10 minute walks most days of the week.   Supplements: Continue. Doing well! Bariatric multivitamin (See list of those that meet needs) Vitamin D as recommended by your doctor Calcium 500 mg x 3 daily spaced 2 hours apart and spaced 2 hours from multivitamin. Can take with vitamin D.   Patient Instructions  Instructions/Goals:  Eat every 3-4 hours. Recommend 2-3 carbohydrate servings at each meal. For snacks, recommend 1 carbohydrate serving and a protein. Eat 1st meal within 1 hour of  waking **Eat every 3-4 hours while awake** **Have balanced meals: protein + starch + veggies** Avoid eating within 3 hours of laying down **Limit eating sweets and sugar sweetened beverages as much as possible** **If having sweets, have with a balanced meal rather than alone.**  If having any dizziness or feeling off, check blood sugar. If 70 or below consume half cup juice and recheck in 15 minutes. Repeat if still 70 or less.   Continue with minimum of 4-5 bottles water daily. If having diarrhea, try for at least 5 bottles water. **If having very frequent diarrhea (multiple times in a day) sip Gatorade or Pedialyte if tolerated.**  Avoid fluids 15 minutes before, during, and 30 minutes after meals.  Make physical activity a part of your week. Try to include at least 30 minutes of physical activity 5 days each week or at least 150 minutes per week. Regular physical activity promotes overall health-including helping to reduce risk for heart disease and diabetes, promoting mental health, and helping [place] sleep better.    Continue trying  to work in 10 minute walks most days of the week.   Supplements: Continue. Doing well! Bariatric multivitamin (See list of those that meet needs) Vitamin D as recommended by your doctor Calcium 500 mg x 3 daily spaced 2 hours apart and spaced 2 hours from multivitamin. Can take with vitamin D.   Expected Outcomes:   Expect good outcomes.   Education material provided: None today.   If problems or questions, patient to contact team via:  Phone and Email  Future DSME appointment:  3 months follow-up.

## 2022-03-17 NOTE — Patient Instructions (Addendum)
Instructions/Goals:  Eat every 3-4 hours. Recommend 2-3 carbohydrate servings at each meal. For snacks, recommend 1 carbohydrate serving and a protein. Eat 1st meal within 1 hour of waking **Eat every 3-4 hours while awake** **Have balanced meals: protein + starch + veggies** Avoid eating within 3 hours of laying down **Limit eating sweets and sugar sweetened beverages as much as possible** **If having sweets, have with a balanced meal rather than alone.**  If having any dizziness or feeling off, check blood sugar. If 70 or below consume half cup juice and recheck in 15 minutes. Repeat if still 70 or less.   Continue with minimum of 4-5 bottles water daily. If having diarrhea, try for at least 5 bottles water. **If having very frequent diarrhea (multiple times in a day) sip Gatorade or Pedialyte if tolerated.**  Avoid fluids 15 minutes before, during, and 30 minutes after meals.  Make physical activity a part of your week. Try to include at least 30 minutes of physical activity 5 days each week or at least 150 minutes per week. Regular physical activity promotes overall health-including helping to reduce risk for heart disease and diabetes, promoting mental health, and helping [place] sleep better.    Continue trying to work in 10 minute walks most days of the week.   Supplements: Continue. Doing well! Bariatric multivitamin (See list of those that meet needs) Vitamin D as recommended by your doctor Calcium 500 mg x 3 daily spaced 2 hours apart and spaced 2 hours from multivitamin. Can take with vitamin D.

## 2022-03-20 ENCOUNTER — Encounter: Payer: Self-pay | Admitting: Registered"

## 2022-03-22 DIAGNOSIS — R059 Cough, unspecified: Secondary | ICD-10-CM | POA: Diagnosis not present

## 2022-03-22 DIAGNOSIS — Z03818 Encounter for observation for suspected exposure to other biological agents ruled out: Secondary | ICD-10-CM | POA: Diagnosis not present

## 2022-03-22 DIAGNOSIS — R0982 Postnasal drip: Secondary | ICD-10-CM | POA: Diagnosis not present

## 2022-03-22 DIAGNOSIS — B349 Viral infection, unspecified: Secondary | ICD-10-CM | POA: Diagnosis not present

## 2022-04-06 DIAGNOSIS — E538 Deficiency of other specified B group vitamins: Secondary | ICD-10-CM | POA: Diagnosis not present

## 2022-04-28 DIAGNOSIS — K580 Irritable bowel syndrome with diarrhea: Secondary | ICD-10-CM | POA: Diagnosis not present

## 2022-05-06 DIAGNOSIS — E538 Deficiency of other specified B group vitamins: Secondary | ICD-10-CM | POA: Diagnosis not present

## 2022-06-02 DIAGNOSIS — Z03818 Encounter for observation for suspected exposure to other biological agents ruled out: Secondary | ICD-10-CM | POA: Diagnosis not present

## 2022-06-02 DIAGNOSIS — R5383 Other fatigue: Secondary | ICD-10-CM | POA: Diagnosis not present

## 2022-06-02 DIAGNOSIS — Z20822 Contact with and (suspected) exposure to covid-19: Secondary | ICD-10-CM | POA: Diagnosis not present

## 2022-06-08 ENCOUNTER — Encounter (HOSPITAL_COMMUNITY): Payer: Self-pay

## 2022-06-08 ENCOUNTER — Other Ambulatory Visit: Payer: Self-pay

## 2022-06-08 ENCOUNTER — Emergency Department (HOSPITAL_COMMUNITY)
Admission: EM | Admit: 2022-06-08 | Discharge: 2022-06-08 | Discharge status: Against Medical Advice | Payer: Medicare Other | Attending: Emergency Medicine | Admitting: Emergency Medicine

## 2022-06-08 DIAGNOSIS — E538 Deficiency of other specified B group vitamins: Secondary | ICD-10-CM | POA: Diagnosis not present

## 2022-06-08 DIAGNOSIS — E11649 Type 2 diabetes mellitus with hypoglycemia without coma: Secondary | ICD-10-CM | POA: Diagnosis not present

## 2022-06-08 DIAGNOSIS — R197 Diarrhea, unspecified: Secondary | ICD-10-CM | POA: Insufficient documentation

## 2022-06-08 DIAGNOSIS — R5383 Other fatigue: Secondary | ICD-10-CM | POA: Diagnosis not present

## 2022-06-08 DIAGNOSIS — Z5321 Procedure and treatment not carried out due to patient leaving prior to being seen by health care provider: Secondary | ICD-10-CM | POA: Insufficient documentation

## 2022-06-08 DIAGNOSIS — E162 Hypoglycemia, unspecified: Secondary | ICD-10-CM | POA: Diagnosis not present

## 2022-06-08 DIAGNOSIS — I10 Essential (primary) hypertension: Secondary | ICD-10-CM | POA: Insufficient documentation

## 2022-06-08 LAB — URINALYSIS, ROUTINE W REFLEX MICROSCOPIC
Bacteria, UA: NONE SEEN
Bilirubin Urine: NEGATIVE
Glucose, UA: NEGATIVE mg/dL
Hgb urine dipstick: NEGATIVE
Ketones, ur: NEGATIVE mg/dL
Leukocytes,Ua: NEGATIVE
Nitrite: NEGATIVE
Protein, ur: NEGATIVE mg/dL
Specific Gravity, Urine: 1.002 — ABNORMAL LOW (ref 1.005–1.030)
pH: 5 (ref 5.0–8.0)

## 2022-06-08 LAB — CBG MONITORING, ED: Glucose-Capillary: 75 mg/dL (ref 70–99)

## 2022-06-08 NOTE — ED Triage Notes (Signed)
Patient states when she woke this AM her CBG was 29 and she ate breakfast. Patient states her CBG at her PCP's office it was 87. Triage CBG-75. Patient also reports that she has a history of IBS and has been having frequent episodes of diarrhea.  Patient also reports hypertension. BP in triage 149/85.

## 2022-06-08 NOTE — ED Provider Triage Note (Signed)
Emergency Medicine Provider Triage Evaluation Note  Wendy Downs , a 71 y.o. female  was evaluated in triage.  Pt complains of hypoglycemia.  Reports having diarrhea over the past few days and that this morning she woke up with a blood sugar in the 30s.  It has been slowly climbing and she was seen by her PCP earlier who sent her here.  History of IBS.  Review of Systems  Positive: Diarrhea Negative: Abdominal pain, nausea vomiting  Physical Exam  BP (!) 149/85   Pulse 66   Temp 98.3 F (36.8 C) (Oral)   Resp 18   Ht 5\' 3"  (1.6 m)   Wt 99.8 kg   SpO2 100%   BMI 38.97 kg/m  Gen:   Awake, no distress   Resp:  Normal effort  MSK:   Moves extremities without difficulty  Other:  Soft and nontender abdomen  Medical Decision Making  Medically screening exam initiated at 1:33 PM.  Appropriate orders placed.  WOODROW DULSKI was informed that the remainder of the evaluation will be completed by another provider, this initial triage assessment does not replace that evaluation, and the importance of remaining in the ED until their evaluation is complete.     [name], PA-C 06/08/22 1334

## 2022-06-17 ENCOUNTER — Ambulatory Visit: Payer: Medicare Other | Admitting: Registered"

## 2022-06-18 DIAGNOSIS — E1169 Type 2 diabetes mellitus with other specified complication: Secondary | ICD-10-CM | POA: Diagnosis not present

## 2022-06-18 DIAGNOSIS — I129 Hypertensive chronic kidney disease with stage 1 through stage 4 chronic kidney disease, or unspecified chronic kidney disease: Secondary | ICD-10-CM | POA: Diagnosis not present

## 2022-07-02 DIAGNOSIS — I10 Essential (primary) hypertension: Secondary | ICD-10-CM | POA: Diagnosis not present

## 2022-07-08 DIAGNOSIS — E538 Deficiency of other specified B group vitamins: Secondary | ICD-10-CM | POA: Diagnosis not present

## 2022-07-09 ENCOUNTER — Ambulatory Visit: Payer: Medicare Other | Attending: Cardiology | Admitting: Cardiology

## 2022-07-09 ENCOUNTER — Encounter: Payer: Self-pay | Admitting: Cardiology

## 2022-07-09 VITALS — BP 152/92 | HR 50 | Ht 63.0 in | Wt 221.0 lb

## 2022-07-09 DIAGNOSIS — R0609 Other forms of dyspnea: Secondary | ICD-10-CM

## 2022-07-09 DIAGNOSIS — I10 Essential (primary) hypertension: Secondary | ICD-10-CM | POA: Diagnosis not present

## 2022-07-09 NOTE — Progress Notes (Signed)
Cardiology Office Note:    Date:  07/09/2022   ID:  Wendy Downs, DOB October 24, 1950, MRN [id number]  PCP:  Wendy Brunette, MD  [facility] Cardiologist:  Wendy Schultz, MD  [facility] Electrophysiologist:  None   Referring MD: Wendy Brunette, MD     History of Present Illness:    Wendy FELT is a 71 y.o. female with difficult to control hypertension, TIA, morbid obesity hyperlipidemia palpitations here for follow-up. No syncope no chest pain no orthopnea. Never smoked. Better hypertension since she was 16.   At visit on 05/09/2020 reported weight loss of close to 60 pounds over the previous year.  Secondary work-up was unremarkable including renal duplex Dopplers.  Saw Dr. Sherene Downs for shortness of breath, PFTs showed restriction because of obesity. No evidence of sarcoid.  In December 2020 was hospitalized secondary to TIA. She had 5 minutes of word finding difficulty slurred speech. MRI was negative for acute infarct. Could be TIA versus migraine per neurology. Neurologist recommended aspirin and Plavix for 21 days followed by aspirin alone.  She reported that she is intolerant to statins in the past, severe myopathy. At 1 point on spironolactone but developed dehydration for overdiuresis. She also tried high-dose amlodipine but developing worsening edema. At this time Pravastatin was started. Hemoglobin A1c was 6.3. LDL 109. She also reported gout on HCTZ.  She was seen at ED 06/08/2022 for hypoglycemia and diarrhea. That morning her blood glucose was in the 30s when she woke. Blood sugar slowly climbed after eating breakfast but did not improve rapidly. When she went to PCP, blood glucose was 87, due to the lack of improvement she was sent to the ED. Triage CBG was 75. Blood pressure at triage was 149/85.    Today she reports that she has been recently diagnoses with IBS and struggling with the symptoms. Every once in awhile she has experienced a quick spell of burning chest  pain, especially at night. It has only happened a couple times of month. It may acid reflux but she is monitoring it if it changes.  She could no tolerate the blood pressure medication she was on for blood pressure initially, she was experiencing headaches and LE edema.. She is now on hydralazine which she is tolerating well.   Dr. Katrinka Downs is currently working on her blood pressure medications.  She tried verapamil but this caused more swelling in her legs.  She is now on hydralazine and nebivolol  She denies any palpitations, shortness of breath, or peripheral edema. No lightheadedness, headaches, syncope, orthopnea, or PND.    Past Medical History:  Diagnosis Date   Diabetes mellitus without complication (HCC)    Gout    Hyperlipidemia    Hypertension    IBS (irritable bowel syndrome)    Morbid obesity (HCC)    Shingles     Past Surgical History:  Procedure Laterality Date   ABDOMINAL HYSTERECTOMY     APPENDECTOMY     CARPAL TUNNEL RELEASE     CHOLECYSTECTOMY      Current Medications: Current Meds  Medication Sig   albuterol (PROVENTIL HFA;VENTOLIN HFA) 108 (90 Base) MCG/ACT inhaler TAKE 2 PUFFS BY MOUTH EVERY 4 HOURS AS NEEDED   allopurinol (ZYLOPRIM) 100 MG tablet Take 200 mg by mouth daily.    ASPIRIN 81 PO Take by mouth.   Calcium Carbonate (CALCIUM 500 PO) Take by mouth. 3 times daily spaced 2 hours apart.   cetirizine (ZYRTEC) 10 MG tablet Take 10 mg  by mouth daily as needed for allergies.   cyanocobalamin (,VITAMIN B-12,) 1000 MCG/ML injection Inject 1,000 mcg into the muscle every 30 (thirty) days.   furosemide (LASIX) 20 MG tablet Take 20 mg by mouth daily.    hydrALAZINE (APRESOLINE) 25 MG tablet Take 25 mg by mouth 3 (three) times daily.   latanoprost (XALATAN) 0.005 % ophthalmic solution Place 1 drop into the right eye at bedtime.    Multiple Vitamins-Minerals (MULTIVITAMIN ADULTS PO) Take by mouth. Bariatric multivitamin   pantoprazole (PROTONIX) 40 MG tablet  Take 80 mg by mouth daily.   prednisoLONE acetate (PRED FORTE) 1 % ophthalmic suspension Place 1 drop into the left eye 3 (three) times daily.    Probiotic Product (PROBIOTIC-10 PO) Take by mouth.   telmisartan (MICARDIS) 80 MG tablet Take 80 mg by mouth daily.    timolol (TIMOPTIC) 0.5 % ophthalmic solution Place 1 drop into both eyes 2 (two) times daily.    TURMERIC CURCUMIN PO Take 900 mg by mouth daily.   VITAMIN D PO Take by mouth.     Allergies:   Clonidine derivatives, Kiwi extract, Lactose intolerance (gi), Other, Prednisone, Valtrex [valacyclovir hcl], Vaseretic [enalapril-hydrochlorothiazide], and Cholestyramine   Social History   Socioeconomic History   Marital status: Divorced    Spouse name: Not on file   Number of children: Not on file   Years of education: Not on file   Highest education level: Not on file  Occupational History   Not on file  Tobacco Use   Smoking status: Never   Smokeless tobacco: Never  Vaping Use   Vaping Use: Never used  Substance and Sexual Activity   Alcohol use: No    Alcohol/week: 0.0 standard drinks of alcohol   Drug use: No   Sexual activity: Not on file  Other Topics Concern   Not on file  Social History Narrative   Not on file   Social Determinants of Health   Financial Resource Strain: Not on file  Food Insecurity: No Food Insecurity (09/06/2021)   Hunger Vital Sign    Worried About Running Out of Food in the Last Year: Never true    Ran Out of Food in the Last Year: Never true  Transportation Needs: Not on file  Physical Activity: Not on file  Stress: Not on file  Social Connections: Not on file     Family History: The patient's family history includes Aneurysm in her brother; Heart attack in her mother; Heart disease in her brother and sister; Hypertension in her mother; Stroke in her brother, father, sister, and sister.  ROS:   Please see the history of present illness.  (+) diarrhea (+) chest pain    All other  systems reviewed and are negative.  EKGs/Labs/Other Studies Reviewed:    The following studies were reviewed today: ECHO 09/2019:   1. Left ventricular ejection fraction, by visual estimation, is 55 to  60%. The left ventricle has normal function. There is mildly increased  left ventricular hypertrophy.   2. Definity contrast agent was given IV to delineate the left ventricular  endocardial borders.   3. Left ventricular diastolic parameters are consistent with Grade I  diastolic dysfunction (impaired relaxation).   4. The left ventricle has no regional wall motion abnormalities.   5. Global right ventricle has normal systolic function.The right  ventricular size is normal. No increase in right ventricular wall  thickness.   6. Left atrial size was mildly dilated.   7. Right  atrial size was normal.   8. The mitral valve is normal in structure. Trivial mitral valve  regurgitation. No evidence of mitral stenosis.   9. The tricuspid valve is normal in structure. Tricuspid valve  regurgitation is trivial.  10. The aortic valve has an indeterminant number of cusps. Aortic valve  regurgitation is trivial. No evidence of aortic valve sclerosis or  stenosis.  11. The pulmonic valve was not well visualized. Pulmonic valve  regurgitation is not visualized.  12. Mildly elevated pulmonary artery systolic pressure.  13. The inferior vena cava is normal in size with greater than 50%  respiratory variability, suggesting right atrial pressure of 3 mmHg.  14. The interatrial septum was not well visualized.    Carotid Dopplers 01/29/2020 Mild disease bilaterally.   Left lower extremity Doppler 01/29/2020-  IMPRESSION: No femoropopliteal DVT nor evidence of DVT within the visualized calf veins.  Long-term monitor 02/22/2020- Sinus rhythm with frequent PVC's (5% of tracing) with bigeminy and trigeminy No atrial fibrillation No pauses   OK to continue with conservative management of  PVC's   EKG:  EKG is personally reviewed.   07/09/2022: Sin rhythm right axis deviation, nonspecific intraventricular block.  05/09/2020: Sinus rhythm 68 PVCs, bigeminy pattern at 1 point on ECG.  Recent Labs: No results found for requested labs within last 365 days.  Recent Lipid Panel    Component Value Date/Time   CHOL 164 09/23/2019 0519   TRIG 110 09/23/2019 0519   HDL 34 (L) 09/23/2019 0519   CHOLHDL 4.8 09/23/2019 0519   VLDL 22 09/23/2019 0519   LDLCALC 108 (H) 09/23/2019 0519    Physical Exam:    VS:  BP (!) 152/92   Pulse (!) 50   Ht 5\' 3"  (1.6 m)   Wt 221 lb (100.2 kg)   SpO2 99%   BMI 39.15 kg/m     Wt Readings from Last 3 Encounters:  07/09/22 221 lb (100.2 kg)  06/08/22 220 lb (99.8 kg)  06/12/20 198 lb 9.6 oz (90.1 kg)     GEN: obese Well nourished, well developed in no acute distress HEENT: Normal NECK: No JVD; No carotid bruits LYMPHATICS: No lymphadenopathy CARDIAC: RRR, no murmurs, rubs, gallops RESPIRATORY:  Clear to auscultation without rales, wheezing or rhonchi  ABDOMEN: Soft, non-tender, non-distended MUSCULOSKELETAL:  No edema; No deformity  SKIN: Warm and dry NEUROLOGIC:  Alert and oriented x 3 PSYCHIATRIC:  Normal affect   ASSESSMENT:    1. Hypertension, unspecified type   2. Dyspnea on exertion   3. Morbid obesity due to excess calories (HCC)     PLAN:    In order of problems listed above:  Essential hypertension, difficult to control - Dr. Tamala Julian has been working on medication management.  Hydralazine and nebivolol.  Verapamil tried but caused lower extremity edema -In the past we have had some difficulty with prerenal azotemia/dehydration with spironolactone and other medications.  Continue with current plan.  Last creatinine 1.15.  From outside labs.  Obesity -Great job with another 20 pounds.  60 pounds last year.  Keep up the good work  PVCs -Seen on event monitor.  No atrial fibrillation.  Seen on ECG today.  Continue  with Bystolic.  Benign.  Occasionally symptomatic.  Discussed again today.  No syncope  TIA versus migraine -December 2020 work-up unremarkable.  MRI negative for stroke.  Echo normal EF.  Mild carotid plaque.  On aspirin.  Statin intolerance -LDL 108 in December 2020, pravastatin was stopped because  of side effects.  Obstructive sleep apnea -CPAP since 2018.  However she has not been using the past few years because of sinusitis.  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  Has been having more vision problems.  Currently out on short-term disability for work.  Irritable bowel syndrome - Per primary team.  Diarrhea   Follow Up: 1 year  Medication Adjustments/Labs and Tests Ordered: Current medicines are reviewed at length with the patient today.  Concerns regarding medicines are outlined above.  Orders Placed This Encounter  Procedures   EKG 12-Lead   No orders of the defined types were placed in this encounter.   Patient Instructions  Medication Instructions:  Your physician recommends that you continue on your current medications as directed. Please refer to the Current Medication list given to you today.  *If you need a refill on your cardiac medications before your next appointment, please call your pharmacy*   Follow-Up: At [facility], you and your health needs are our priority.  As part of our continuing mission to provide you with exceptional heart care, we have created designated Provider Care Teams.  These Care Teams include your primary Cardiologist (physician) and Advanced Practice Providers (APPs -  Physician Assistants and Nurse Practitioners) who all work together to provide you with the care you need, when you need it.  1 year(s)  The format for your next appointment:   In Person  Provider:   Candee Furbish, MD     Important Information About [place],acting as a scribe for Candee Furbish, MD.,have documented all  relevant documentation on the behalf of Candee Furbish, MD,as directed by  Candee Furbish, MD while in the presence of Candee Furbish, MD.   I, Candee Furbish, MD, have reviewed all documentation for this visit. The documentation on 07/09/22 for the exam, diagnosis, procedures, and orders are all accurate and complete.   Signed, Candee Furbish, MD  07/09/2022 9:13 AM    [facility]

## 2022-07-09 NOTE — Patient Instructions (Signed)
Medication Instructions:  Your physician recommends that you continue on your current medications as directed. Please refer to the Current Medication list given to you today.  *If you need a refill on your cardiac medications before your next appointment, please call your pharmacy*   Follow-Up: At [facility], you and your health needs are our priority.  As part of our continuing mission to provide you with exceptional heart care, we have created designated Provider Care Teams.  These Care Teams include your primary Cardiologist (physician) and Advanced Practice Providers (APPs -  Physician Assistants and Nurse Practitioners) who all work together to provide you with the care you need, when you need it.  1 year(s)  The format for your next appointment:   In Person  Provider:   Candee Furbish, MD     Important Information About Sugar

## 2022-07-21 DIAGNOSIS — I129 Hypertensive chronic kidney disease with stage 1 through stage 4 chronic kidney disease, or unspecified chronic kidney disease: Secondary | ICD-10-CM | POA: Diagnosis not present

## 2022-07-21 DIAGNOSIS — Z23 Encounter for immunization: Secondary | ICD-10-CM | POA: Diagnosis not present

## 2022-08-06 DIAGNOSIS — E538 Deficiency of other specified B group vitamins: Secondary | ICD-10-CM | POA: Diagnosis not present

## 2022-08-13 ENCOUNTER — Encounter: Payer: Medicare Other | Admitting: Registered"

## 2022-08-18 DIAGNOSIS — K219 Gastro-esophageal reflux disease without esophagitis: Secondary | ICD-10-CM | POA: Diagnosis not present

## 2022-08-18 DIAGNOSIS — K580 Irritable bowel syndrome with diarrhea: Secondary | ICD-10-CM | POA: Diagnosis not present

## 2022-08-20 ENCOUNTER — Encounter: Payer: Medicare Other | Attending: Family Medicine | Admitting: Registered"

## 2022-08-20 ENCOUNTER — Encounter: Payer: Self-pay | Admitting: Registered"

## 2022-08-20 DIAGNOSIS — E119 Type 2 diabetes mellitus without complications: Secondary | ICD-10-CM | POA: Diagnosis not present

## 2022-08-20 NOTE — Progress Notes (Signed)
Diabetes Self-Management Education  Visit Type:    Appt. Start Time: 0911 Appt. End Time: 0945  08/20/22  [name], identified by name and date of birth, is a 71 y.o. female with a diagnosis of Diabetes:  . 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   ASSESSMENT  There were no vitals taken for this visit. There is no height or weight on file to calculate BMI.  Reported Weight: 216.7 lb (without clothing taken at home)   Pt reports doing some traveling to [place] for son's birthday in September. Reports they had a good time.   Reports some changes with HTN medications (has been updated in chart), but reports last time her blood pressure was checked it was lower than usual. Pt reports trying to reduce sodium intake.   Pt reports she was having diarrhea daily back in August and her doctor sent her to ER but she ended up going home due to long wait time. Reports she started a probiotic around that time and reports not having much diarrhea since then. Reports GI doing much better lately. Feels she has also been able to tell what foods cause the GI distress. Had some cheesecake once and immediately caused diarrhea-feels certain dairy and high sugar foods. Reports having some dairy and candy yesterday without any GI issues. Reports noticing when she goes too long without eating her stomach will hurt. Reports she can do salad if after other foods but not on an empty stomach.   Pt reports doing better about eating consistently but not as good as she would like. Reports yesterday missed her breakfast due to getting busy on the phone and running errands. Reports typically tries to get in breakfast, at least snack around lunch and then dinner.   Pt reports having a [employer] for [employer] at her church next week which she is looking forward to attending.    Pt reports she hasn't been walking lately but wants to get up  earlier in morning to get in walking and breakfast. Reports she feels better if getting up earlier because she is more productive. Reports she needs to turn off her TV at night to prevent watching it late at night and staying up too late. Reports she would like to focus on her sleep schedule and getting breakfast and walking in as her main goals today.   SMBG: Pt doesn't usually check unless feeling symptoms of low. No readings to report today. Denies any feelings of low blood sugar.   Pertinent Lab Values: 12/09/20:  HgbA1c: 5.6 WNL Uric Acid: 7.2 H eGFR: 38 AAeGFR: 46  Other Signs/Symptoms: Pt reports her hair is now growing better than in the past. Reports tiredness lately but feels due to very busy schedule.   GI: Pt reports having more frequent diarrhea than at last visit. May be due to increased intake of sugary foods.   Food Allergies/Intolerances: kiwi allergy; lactose intolerance. Pt reports diarrhea sometimes with high sugar foods, artificial sugars, and berries. Pt also avoid nitrates and rice.   Medications: See list.   Supplements: tumeric; receives monthly B12 shots; OTC calcium 500 mg x 3 times daily (has not been taking past month), vitamin D, and bariatric multivitamin; probiotic recently started.    24 Hour Recall: Reports it was a busy day Breakfast: None reported.  Snack: None reported.  Lunch 3 PM: [employer]: breaded chicken tender x 1,  green beans, salad, mashed potatoes with no gravy, water  Snack: [employer]: Ice cream cone  Dinner 830 PM: Baked chicken wings 1-2, 1 tbsp mac and cheese, regular Butterfinger, water  Snack: None reported.  Beverages: water   Physical Activity: None currently. Pt would like to start back with walks.   Individualized Plan for Diabetes Self-Management Training:   Learning Objective:  Patient will have a greater understanding of diabetes self-management. Patient education plan is to attend individual and/or group sessions  per assessed needs and concerns.   Plan: Discussed goals pt would most like to focus on between now next follow up. Worked with pt to set out plan for reaching goals. Discussed need for calcium supplement due to hx of bariatric surgery and additionally low dairy intake due to intolerance. Discussed transition to colleague Antonieta Iba, RDN, LDN, CDE for next follow up as current provider will be moving next spring. Pt appeared agreeable to information/goals discussed.   Instructions/Goals:   Goal: Wake up earlier, take walk and eat breakfast:  Step 1: Turn off TV at night and go to sleep at 10 PM Step 2: Set alarm for 7 AM and get up in the morning Step 3: Have breakfast within 1 hour of waking  Step 4: Take a morning walk Step 5: Take at least 1 hour to de-clutter papers each day  Eat every 3-4 hours. Recommend 2-3 carbohydrate servings at each meal. For snacks, recommend 1 carbohydrate serving and a protein. Eat 1st meal within 1 hour of waking **Eat every 3-4 hours while awake** **Have balanced meals: protein + starch + veggies** Avoid eating within 3 hours of laying down **Limit eating sweets and sugar sweetened beverages as much as possible** **If having sweets, have with a balanced meal rather than alone.**  If having any dizziness or feeling off, check blood sugar. If 70 or below consume half cup juice and recheck in 15 minutes. Repeat if still 70 or less.   Continue with minimum of 4-5 bottles water daily. If having diarrhea, try for at least 5 bottles water. **If having very frequent diarrhea (multiple times in a day) sip Gatorade or Pedialyte.**  Avoid fluids 15 minutes before, during, and 30 minutes after meals.  Supplements: Continue. Work to include calcium as well.  Bariatric multivitamin (See list of those that meet needs) Vitamin D as recommended by your doctor Calcium 500 mg x 3 daily spaced 2 hours apart and spaced 2 hours from multivitamin. Can take with vitamin  D.  Recommend Antonieta Iba, RDN, LDN, CDE for follow up  Patient Instructions  Instructions/Goals:   Goal: Wake up earlier, take walk and eat breakfast:  Step 1: Turn off TV at night and go to sleep at 10 PM Step 2: Set alarm for 7 AM and get up in the morning Step 3: Have breakfast within 1 hour of waking  Step 4: Take a morning walk Step 5: Take at least 1 hour to de-clutter papers each day  Eat every 3-4 hours. Recommend 2-3 carbohydrate servings at each meal. For snacks, recommend 1 carbohydrate serving and a protein. Eat 1st meal within 1 hour of waking **Eat every 3-4 hours while awake** **Have balanced meals: protein + starch + veggies** Avoid eating within 3 hours of laying down **Limit eating sweets and sugar sweetened beverages as much as possible** **If having sweets, have with a balanced meal rather than alone.**  If having any dizziness or feeling off, check blood sugar. If 70  or below consume half cup juice and recheck in 15 minutes. Repeat if still 70 or less.   Continue with minimum of 4-5 bottles water daily. If having diarrhea, try for at least 5 bottles water. **If having very frequent diarrhea (multiple times in a day) sip Gatorade or Pedialyte.**  Avoid fluids 15 minutes before, during, and 30 minutes after meals.  Supplements: Continue. Work to include calcium as well.  Bariatric multivitamin (See list of those that meet needs) Vitamin D as recommended by your doctor Calcium 500 mg x 3 daily spaced 2 hours apart and spaced 2 hours from multivitamin. Can take with vitamin D.  Recommend Antonieta Iba, RDN, LDN, CDE for follow up  Expected Outcomes:   Expect good outcomes.   Education material provided: None today.   If problems or questions, patient to contact team via:  Phone and Email  Future DSME appointment:  4 months follow-up with Antonieta Iba, RDN, LDN, CDE

## 2022-08-20 NOTE — Patient Instructions (Addendum)
Instructions/Goals:   Goal: Wake up earlier, take walk and eat breakfast:  Step 1: Turn off TV at night and go to sleep at 10 PM Step 2: Set alarm for 7 AM and get up in the morning Step 3: Have breakfast within 1 hour of waking  Step 4: Take a morning walk Step 5: Take at least 1 hour to de-clutter papers each day  Eat every 3-4 hours. Recommend 2-3 carbohydrate servings at each meal. For snacks, recommend 1 carbohydrate serving and a protein. Eat 1st meal within 1 hour of waking **Eat every 3-4 hours while awake** **Have balanced meals: protein + starch + veggies** Avoid eating within 3 hours of laying down **Limit eating sweets and sugar sweetened beverages as much as possible** **If having sweets, have with a balanced meal rather than alone.**  If having any dizziness or feeling off, check blood sugar. If 70 or below consume half cup juice and recheck in 15 minutes. Repeat if still 70 or less.   Continue with minimum of 4-5 bottles water daily. If having diarrhea, try for at least 5 bottles water. **If having very frequent diarrhea (multiple times in a day) sip Gatorade or Pedialyte.**  Avoid fluids 15 minutes before, during, and 30 minutes after meals.  Supplements: Continue. Work to include calcium as well.  Bariatric multivitamin (See list of those that meet needs) Vitamin D as recommended by your doctor Calcium 500 mg x 3 daily spaced 2 hours apart and spaced 2 hours from multivitamin. Can take with vitamin D.  Recommend Oran Rein, RDN, LDN, CDE for follow up

## 2022-08-27 DIAGNOSIS — U071 COVID-19: Secondary | ICD-10-CM | POA: Diagnosis not present

## 2022-08-27 DIAGNOSIS — J029 Acute pharyngitis, unspecified: Secondary | ICD-10-CM | POA: Diagnosis not present

## 2022-08-27 DIAGNOSIS — R051 Acute cough: Secondary | ICD-10-CM | POA: Diagnosis not present

## 2022-09-08 DIAGNOSIS — H401112 Primary open-angle glaucoma, right eye, moderate stage: Secondary | ICD-10-CM | POA: Diagnosis not present

## 2022-09-09 DIAGNOSIS — E538 Deficiency of other specified B group vitamins: Secondary | ICD-10-CM | POA: Diagnosis not present

## 2022-09-21 DIAGNOSIS — E782 Mixed hyperlipidemia: Secondary | ICD-10-CM | POA: Diagnosis not present

## 2022-09-21 DIAGNOSIS — G4733 Obstructive sleep apnea (adult) (pediatric): Secondary | ICD-10-CM | POA: Diagnosis not present

## 2022-09-21 DIAGNOSIS — I129 Hypertensive chronic kidney disease with stage 1 through stage 4 chronic kidney disease, or unspecified chronic kidney disease: Secondary | ICD-10-CM | POA: Diagnosis not present

## 2022-09-21 DIAGNOSIS — M109 Gout, unspecified: Secondary | ICD-10-CM | POA: Diagnosis not present

## 2022-09-21 DIAGNOSIS — D510 Vitamin B12 deficiency anemia due to intrinsic factor deficiency: Secondary | ICD-10-CM | POA: Diagnosis not present

## 2022-09-21 DIAGNOSIS — Z0000 Encounter for general adult medical examination without abnormal findings: Secondary | ICD-10-CM | POA: Diagnosis not present

## 2022-09-21 DIAGNOSIS — N183 Chronic kidney disease, stage 3 unspecified: Secondary | ICD-10-CM | POA: Diagnosis not present

## 2022-09-21 DIAGNOSIS — D508 Other iron deficiency anemias: Secondary | ICD-10-CM | POA: Diagnosis not present

## 2022-09-21 DIAGNOSIS — E1169 Type 2 diabetes mellitus with other specified complication: Secondary | ICD-10-CM | POA: Diagnosis not present

## 2022-09-21 DIAGNOSIS — K219 Gastro-esophageal reflux disease without esophagitis: Secondary | ICD-10-CM | POA: Diagnosis not present

## 2022-09-25 DIAGNOSIS — L239 Allergic contact dermatitis, unspecified cause: Secondary | ICD-10-CM | POA: Diagnosis not present

## 2022-09-25 DIAGNOSIS — U099 Post covid-19 condition, unspecified: Secondary | ICD-10-CM | POA: Diagnosis not present

## 2022-10-08 DIAGNOSIS — E538 Deficiency of other specified B group vitamins: Secondary | ICD-10-CM | POA: Diagnosis not present

## 2022-10-13 ENCOUNTER — Other Ambulatory Visit: Payer: Self-pay | Admitting: Obstetrics and Gynecology

## 2022-10-13 DIAGNOSIS — Z1231 Encounter for screening mammogram for malignant neoplasm of breast: Secondary | ICD-10-CM

## 2022-11-05 DIAGNOSIS — E538 Deficiency of other specified B group vitamins: Secondary | ICD-10-CM | POA: Diagnosis not present

## 2022-11-16 NOTE — Progress Notes (Unsigned)
Cardiology Office Note:    Date:  11/18/2022   ID:  Wendy Downs, DOB 1951-06-19, MRN [id number]  PCP:  Carol Ada, MD   [facility] Providers Cardiologist:  Candee Furbish, MD    Referring MD: Carol Ada, MD   Chief Complaint: shortness of breath  History of Present Illness:    Wendy Downs is a very pleasant 72 y.o. female with a hx of difficult to control hypertension, TIA, morbid obesity, HLD, palpitations, PVCs  She establish care with cardiology for management of hypertension.She had a normal renal duplex. Hospitalization 09/2019 secondary to TIA. She had 5 minutes of word finding difficulty and slurred speech.  MRI was negative for acute infarct. Neurologist recommended aspirin and Plavix for 21 days followed by aspirin alone. Etiology possibly TIA vs migraine.  Normal 2D echo, mild carotid plaque. In July 2021 she reported weight loss of close to 60 pounds over the previous year. Seen by pulmonology for shortness of breath.  PFTs showed restriction 2/2 obesity, no evidence of sarcoidosis. She reported intolerance of statins and developed gout on HCTZ.  Last cardiology clinic visit was 07/09/2022 with Dr. Marlou Porch at which time she reported her PCP had been working on medication management for hypertension.  She was currently tolerating hydralazine and nebivolol.  Verapamil was tried but caused lower extremity edema.  In the past, she had difficulty with prerenal azotemia/dehydration with spironolactone. Intentional weight loss of additional 20 lbs. she reported she was not using CPAP due to sinusitis.  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 No medication changes were made and 1 year follow-up was recommended.   Today, she is here for evaluation of recent worsening of shortness of breath. Started noting shortness of breath approximately 1 week ago when walking a few feet to dumpster. Also  accompanied by chest discomfort that radiated into her back. No diaphoresis or n/v.  Noted that her legs feel very heavy when going up steps. Feels that equilibrium is off - at times feels off balance but no falls. 2 days ago she had an episode of feeling hot and diaphoretic, like a "hot flash." Checked her BP which was 160 /108 then 139/88. PCP has been adjusting antihypertensives. Recently had low BP at PCP 105/60 mmHg. Was advised to push fluids because she has frequent diarrhea. BP remained low so telmisartan was reduced to 40 mg daily. No dark tarry stools  Admits to dietary indiscretion sodium, eats potato chips daily. Has been working with a nutritionist due to IBS. No palpitations, presyncope, syncope.   Past Medical History:  Diagnosis Date   Diabetes mellitus without complication (HCC)    Gout    Hyperlipidemia    Hypertension    IBS (irritable bowel syndrome)    Morbid obesity ([place])    Shingles     Past Surgical History:  Procedure Laterality Date   ABDOMINAL HYSTERECTOMY     APPENDECTOMY     CARPAL TUNNEL RELEASE     CHOLECYSTECTOMY      Current Medications: Current Meds  Medication Sig   albuterol (PROVENTIL HFA;VENTOLIN HFA) 108 (90 Base) MCG/ACT inhaler TAKE 2 PUFFS BY MOUTH EVERY 4 HOURS AS NEEDED   allopurinol (ZYLOPRIM) 100 MG tablet Take 200 mg by mouth daily.    ASPIRIN 81 PO Take by mouth.   Calcium Carbonate (CALCIUM 500 PO) Take by mouth. 3 times daily spaced 2 hours apart.   cetirizine (ZYRTEC)  10 MG tablet Take 10 mg by mouth daily as needed for allergies.   cyanocobalamin (,VITAMIN B-12,) 1000 MCG/ML injection Inject 1,000 mcg into the muscle every 30 (thirty) days.   furosemide (LASIX) 20 MG tablet Take 20 mg by mouth daily.   latanoprost (XALATAN) 0.005 % ophthalmic solution Place 1 drop into the right eye at bedtime.    metoprolol tartrate (LOPRESSOR) 50 MG tablet Take one (1) tablet by mouth ( 50 mg) 2 hours prior to CT scan.   Multiple  Vitamins-Minerals (MULTIVITAMIN ADULTS PO) Take by mouth. Bariatric multivitamin   Nebivolol HCl 20 MG TABS Take 20 mg by mouth daily.   pantoprazole (PROTONIX) 40 MG tablet Take 80 mg by mouth daily.   prednisoLONE acetate (PRED FORTE) 1 % ophthalmic suspension Place 1 drop into the left eye 3 (three) times daily.    Probiotic Product (PROBIOTIC-10 PO) Take by mouth.   telmisartan (MICARDIS) 40 MG tablet Take 40 mg by mouth daily.   timolol (TIMOPTIC) 0.5 % ophthalmic solution Place 1 drop into both eyes 2 (two) times daily.    TURMERIC CURCUMIN PO Take 900 mg by mouth daily.   VITAMIN D PO Take by mouth.     Allergies:   Clonidine derivatives, Kiwi extract, Lactose intolerance (gi), Other, Prednisone, Valtrex [valacyclovir hcl], Vaseretic [enalapril-hydrochlorothiazide], and Cholestyramine   Social History   Socioeconomic History   Marital status: Divorced    Spouse name: Not on file   Number of children: Not on file   Years of education: Not on file   Highest education level: Not on file  Occupational History   Not on file  Tobacco Use   Smoking status: Never   Smokeless tobacco: Never  Vaping Use   Vaping Use: Never used  Substance and Sexual Activity   Alcohol use: No    Alcohol/week: 0.0 standard drinks of alcohol   Drug use: No   Sexual activity: Not on file  Other Topics Concern   Not on file  Social History Narrative   Not on file   Social Determinants of Health   Financial Resource Strain: Not on file  Food Insecurity: No Food Insecurity (09/06/2021)   Hunger Vital Sign    Worried About Running Out of Food in the Last Year: Never true    Ran Out of Food in the Last Year: Never true  Transportation Needs: Not on file  Physical Activity: Not on file  Stress: Not on file  Social Connections: Not on file     Family History: The patient's family history includes Aneurysm in her brother; Heart attack in her mother; Heart disease in her brother and sister;  Hypertension in her mother; Stroke in her brother, father, sister, and sister.  ROS:   Please see the history of present illness.    + chest pain + dyspnea on exertion + leg heaviness + off balance All other systems reviewed and are negative.  Labs/Other Studies Reviewed:    The following studies were reviewed today:  Cardiac Monitor 03/04/20 Sinus rhythm with frequent PVC's (5% of tracing) with bigeminy and trigeminy No atrial fibrillation No pauses   OK to continue with conservative management of PVC's  Echo 09/23/19 1. Left ventricular ejection fraction, by visual estimation, is 55 to  60%. The left ventricle has normal function. There is mildly increased  left ventricular hypertrophy.   2. Definity contrast agent was given IV to delineate the left ventricular  endocardial borders.   3. Left ventricular  diastolic parameters are consistent with Grade I  diastolic dysfunction (impaired relaxation).   4. The left ventricle has no regional wall motion abnormalities.   5. Global right ventricle has normal systolic function.The right  ventricular size is normal. No increase in right ventricular wall  thickness.   6. Left atrial size was mildly dilated.   7. Right atrial size was normal.   8. The mitral valve is normal in structure. Trivial mitral valve  regurgitation. No evidence of mitral stenosis.   9. The tricuspid valve is normal in structure. Tricuspid valve  regurgitation is trivial.  10. The aortic valve has an indeterminant number of cusps. Aortic valve  regurgitation is trivial. No evidence of aortic valve sclerosis or  stenosis.  11. The pulmonic valve was not well visualized. Pulmonic valve  regurgitation is not visualized.  12. Mildly elevated pulmonary artery systolic pressure.  13. The inferior vena cava is normal in size with greater than 50%  respiratory variability, suggesting right atrial pressure of 3 mmHg.  14. The interatrial septum was not well  visualized.    Recent Labs: No results found for requested labs within last 365 days.  Recent Lipid Panel    Component Value Date/Time   CHOL 164 09/23/2019 0519   TRIG 110 09/23/2019 0519   HDL 34 (L) 09/23/2019 0519   CHOLHDL 4.8 09/23/2019 0519   VLDL 22 09/23/2019 0519   LDLCALC 108 (H) 09/23/2019 0519     Risk Assessment/Calculations:       Physical Exam:    VS:  BP 120/70   Pulse (!) 59   Ht 5\' 3"  (1.6 m)   Wt 219 lb 3.2 oz (99.4 kg)   SpO2 94%   BMI 38.83 kg/m     Wt Readings from Last 3 Encounters:  11/18/22 219 lb 3.2 oz (99.4 kg)  07/09/22 221 lb (100.2 kg)  06/08/22 220 lb (99.8 kg)     GEN: Obese, well developed in no acute distress HEENT: Normal NECK: No JVD; No carotid bruits CARDIAC: Irregular RR, 2/6 systolic murmur. No rubs, gallops RESPIRATORY:  Clear to auscultation without rales, wheezing or rhonchi  ABDOMEN: Soft, non-tender, non-distended MUSCULOSKELETAL:  No edema; No deformity. 2+ pedal pulses, equal bilaterally SKIN: Warm and dry NEUROLOGIC:  Alert and oriented x 3 PSYCHIATRIC:  Normal affect   EKG:  EKG is ordered today.  The ekg ordered today demonstrates sinus rhythm at 72 bpm with frequent PVCs, nonspecific T wave abnormality  Diagnoses:    1. Precordial pain   2. Resistant hypertension   3. Dyspnea on exertion   4. PVC (premature ventricular contraction)   5. Hyperlipidemia, unspecified hyperlipidemia type   6. Chronic heart failure with preserved ejection fraction (HFpEF) ([place])   7. Murmur    Assessment and Plan:     HFpEF/DOE: Recent increase in dyspnea on exertion with simple tasks.  Associated with chest pain but no n/v, diaphoresis. Symptoms improve with rest. No orthopnea, PND, edema.  Appears euvolemic on exam, however body habitus makes it difficult to assess.  Echo 09/2019 revealed normal LVEF 55 to 60%, mild LVH, G1 DD. We will get echocardiogram for evaluation of heart and valve function.  Will get coronary CTA  for mapping of coronary anatomy as DOE may be angina equivalent. Advised increase furosemide to 40 mg x 2 days. Will check BMP today to evaluate for need to add potassium supplement.  Getting CBC to evaluate for anemia.  Chest pain: As noted above, chest  pain associated with DOE. Also feeling heaviness in her legs with exertion which may be angina equivalent. No recent ischemic evaluation. Will get coronary CTA for mapping of coronary anatomy. Will get echo for evaluation of heart function.  Will have her take Lopressor 50 mg 2 hours prior to CT. Continue aspirin, nebivolol.   Murmur: 2/6 systolic murmur on exam. She reports previously being told she has a murmur. May be a benign flow murmur. Only trivial valve disease on echo 09/2019. Updating echo as noted above, will evaluate for structural heart disease.   Hypertension: BP initially elevated but improved on my recheck. No change in antihypertensive therapy today.  PVCs: Frequent PVCs on EKG today. She is unaware of these. Cardiac monitor 02/2020 sinus rhythm with frequent PVCs (5% tracing) with bigeminy and trigeminy. Admits she does not take nebivolol routinely as she should. Encouraged consistency. Will get echo to evaluate LV function.   Hyperlipidemia: LDL 92 on 09/21/22. Will further risk stratify following coronary CT. With history of diabetes would aim for goal < 70.      Disposition: 2 months with me  Medication Adjustments/Labs and Tests Ordered: Current medicines are reviewed at length with the patient today.  Concerns regarding medicines are outlined above.  Orders Placed This Encounter  Procedures   CT CORONARY MORPH W/CTA COR W/SCORE W/CA W/CM &/OR WO/CM   Basic Metabolic Panel (BMET)   CBC   EKG 12-Lead   ECHOCARDIOGRAM COMPLETE   Meds ordered this encounter  Medications   metoprolol tartrate (LOPRESSOR) 50 MG tablet    Sig: Take one (1) tablet by mouth ( 50 mg) 2 hours prior to CT scan.    Dispense:  1 tablet     Refill:  0    Patient Instructions  Medication Instructions:   CHANGE Lasix two (2) tablets by mouth ( 40 mg) daily X 2 days than go back to one (1) tablet by mouth ( 20 mg) daily.   *If you need a refill on your cardiac medications before your next appointment, please call your pharmacy*   Lab Work:  TODAY!!! BMET/CBC  If you have labs (blood work) drawn today and your tests are completely normal, you will receive your results only by: MyChart Message (if you have MyChart) OR A paper copy in the mail If you have any lab test that is abnormal or we need to change your treatment, we will call you to review the results.   Testing/Procedures:  Your physician has requested that you have an echocardiogram. Echocardiography is a painless test that uses sound waves to create images of your heart. It provides your doctor with information about the size and shape of your heart and how well your heart's chambers and valves are working. This procedure takes approximately one hour. There are no restrictions for this procedure. Please do NOT wear perfume, or lotions (deodorant is allowed). Please arrive 15 minutes prior to your appointment time.    Your cardiac CT will be scheduled at one of the below locations:   [facility] [address] [place], [place] [address] [phone]  If scheduled at [facility], please arrive at the [facility] and Children's Entrance (Entrance C2) of [facility] 30 minutes prior to test start time. You can use the FREE valet parking offered at entrance C (encouraged to control the heart rate for the test)  Proceed to the [facility] Radiology Department (first floor) to check-in and test prep.  All radiology  patients and guests should use entrance C2 at [facility], accessed from [facility], even though the hospital's physical address listed is [address].   Please follow these instructions  carefully (unless otherwise directed):   On the Night Before the Test: Be sure to Drink plenty of water. Do not consume any caffeinated/decaffeinated beverages or chocolate 12 hours prior to your test. Do not take any antihistamines 12 hours prior to your test. On the Day of the Test: Drink plenty of water until 1 hour prior to the test. Do not eat any food 1 hour prior to test. You may take your regular medications prior to the test.  Take metoprolol (Lopressor) two hours prior to test. ( 1 ) tablet.  If you take Furosemide please HOLD on the morning of the test. FEMALES- please wear underwire-free bra if available, avoid dresses & tight clothing      After the Test: Drink plenty of water. After receiving IV contrast, you may experience a mild flushed feeling. This is normal. On occasion, you may experience a mild rash up to 24 hours after the test. This is not dangerous. If this occurs, you can take Benadryl 25 mg and increase your fluid intake. If you experience trouble breathing, this can be serious. If it is severe call 911 IMMEDIATELY. If it is mild, please call our office.  We will call to schedule your test 2-4 weeks out understanding that some insurance companies will need an authorization prior to the service being performed.   For non-scheduling related questions, please contact the cardiac imaging nurse navigator should you have any questions/concerns: Rockwell Alexandria, Cardiac Imaging Nurse Navigator Larey Brick, Cardiac Imaging Nurse Navigator [facility] Direct Office Dial: [phone]   For scheduling needs, including cancellations and rescheduling, please call [place], [phone].     Follow-Up: At [facility], you and your health needs are our priority.  As part of our continuing mission to provide you with exceptional heart care, we have created designated Provider Care Teams.  These Care Teams include your primary  Cardiologist (physician) and Advanced Practice Providers (APPs -  Physician Assistants and Nurse Practitioners) who all work together to provide you with the care you need, when you need it.  We recommend signing up for the patient portal called "MyChart".  Sign up information is provided on this After Visit Summary.  MyChart is used to connect with patients for Virtual Visits (Telemedicine).  Patients are able to view lab/test results, encounter notes, upcoming appointments, etc.  Non-urgent messages can be sent to your provider as well.   To learn more about what you can do with MyChart, go to [email or web address].    Your next appointment:   2 month(s)  Provider:   Eligha Bridegroom, NP           Signed, Levi Aland, NP  11/18/2022 5:03 PM    [facility]

## 2022-11-18 ENCOUNTER — Ambulatory Visit: Payer: Medicare Other | Attending: Nurse Practitioner | Admitting: Nurse Practitioner

## 2022-11-18 ENCOUNTER — Encounter: Payer: Self-pay | Admitting: Nurse Practitioner

## 2022-11-18 VITALS — BP 120/70 | HR 59 | Ht 63.0 in | Wt 219.2 lb

## 2022-11-18 DIAGNOSIS — R0609 Other forms of dyspnea: Secondary | ICD-10-CM | POA: Diagnosis not present

## 2022-11-18 DIAGNOSIS — E785 Hyperlipidemia, unspecified: Secondary | ICD-10-CM | POA: Diagnosis not present

## 2022-11-18 DIAGNOSIS — I5032 Chronic diastolic (congestive) heart failure: Secondary | ICD-10-CM | POA: Diagnosis not present

## 2022-11-18 DIAGNOSIS — R011 Cardiac murmur, unspecified: Secondary | ICD-10-CM

## 2022-11-18 DIAGNOSIS — I1A0 Resistant hypertension: Secondary | ICD-10-CM

## 2022-11-18 DIAGNOSIS — I493 Ventricular premature depolarization: Secondary | ICD-10-CM | POA: Diagnosis not present

## 2022-11-18 DIAGNOSIS — R072 Precordial pain: Secondary | ICD-10-CM | POA: Diagnosis not present

## 2022-11-18 DIAGNOSIS — R0789 Other chest pain: Secondary | ICD-10-CM | POA: Diagnosis not present

## 2022-11-18 DIAGNOSIS — I10 Essential (primary) hypertension: Secondary | ICD-10-CM | POA: Diagnosis not present

## 2022-11-18 MED ORDER — METOPROLOL TARTRATE 50 MG PO TABS: ORAL_TABLET | ORAL | 0 refills | Status: DC

## 2022-11-18 NOTE — Patient Instructions (Signed)
Medication Instructions:   CHANGE Lasix two (2) tablets by mouth ( 40 mg) daily X 2 days than go back to one (1) tablet by mouth ( 20 mg) daily.   *If you need a refill on your cardiac medications before your next appointment, please call your pharmacy*   Lab Work:  TODAY!!! BMET/CBC  If you have labs (blood work) drawn today and your tests are completely normal, you will receive your results only by: [place] (if you have MyChart) OR A paper copy in the mail If you have any lab test that is abnormal or we need to change your treatment, we will call you to review the results.   Testing/Procedures:  Your physician has requested that you have an echocardiogram. Echocardiography is a painless test that uses sound waves to create images of your heart. It provides your doctor with information about the size and shape of your heart and how well your heart's chambers and valves are working. This procedure takes approximately one hour. There are no restrictions for this procedure. Please do NOT wear perfume, or lotions (deodorant is allowed). Please arrive 15 minutes prior to your appointment time.    Your cardiac CT will be scheduled at one of the below locations:   [facility] [address] [place], [place] [address] [phone]  If scheduled at [facility], please arrive at the [facility] and Children's Entrance (Entrance C2) of [facility] 30 minutes prior to test start time. You can use the FREE valet parking offered at entrance C (encouraged to control the heart rate for the test)  Proceed to the [facility] Radiology Department (first floor) to check-in and test prep.  All radiology patients and guests should use entrance C2 at [facility], accessed from [facility], even though the hospital's physical address listed is [address].   Please follow these instructions carefully (unless otherwise directed):   On  the Night Before the Test: Be sure to Drink plenty of water. Do not consume any caffeinated/decaffeinated beverages or chocolate 12 hours prior to your test. Do not take any antihistamines 12 hours prior to your test. On the Day of the Test: Drink plenty of water until 1 hour prior to the test. Do not eat any food 1 hour prior to test. You may take your regular medications prior to the test.  Take metoprolol (Lopressor) two hours prior to test. ( 1 ) tablet.  If you take Furosemide please HOLD on the morning of the test. FEMALES- please wear underwire-free bra if available, avoid dresses & tight clothing      After the Test: Drink plenty of water. After receiving IV contrast, you may experience a mild flushed feeling. This is normal. On occasion, you may experience a mild rash up to 24 hours after the test. This is not dangerous. If this occurs, you can take Benadryl 25 mg and increase your fluid intake. If you experience trouble breathing, this can be serious. If it is severe call 911 IMMEDIATELY. If it is mild, please call our office.  We will call to schedule your test 2-4 weeks out understanding that some insurance companies will need an authorization prior to the service being performed.   For non-scheduling related questions, please contact the cardiac imaging nurse navigator should you have any questions/concerns: Marchia Bond, Cardiac Imaging Nurse Navigator Gordy Clement, Cardiac Imaging Nurse Navigator [place] [facility] Direct Office Dial: [phone]  For scheduling needs, including cancellations and rescheduling, please call [place], [phone].     Follow-Up: At [facility], you and your health needs are our priority.  As part of our continuing mission to provide you with exceptional heart care, we have created designated Provider Care Teams.  These Care Teams include your primary Cardiologist (physician) and Advanced Practice  Providers (APPs -  Physician Assistants and Nurse Practitioners) who all work together to provide you with the care you need, when you need it.  We recommend signing up for the patient portal called "MyChart".  Sign up information is provided on this After Visit Summary.  MyChart is used to connect with patients for Virtual Visits (Telemedicine).  Patients are able to view lab/test results, encounter notes, upcoming appointments, etc.  Non-urgent messages can be sent to your provider as well.   To learn more about what you can do with MyChart, go to [email or web address].    Your next appointment:   2 month(s)  Provider:   Christen Bame, NP

## 2022-11-19 ENCOUNTER — Other Ambulatory Visit: Payer: Self-pay | Admitting: *Deleted

## 2022-11-19 LAB — CBC
Hematocrit: 37.7 % (ref 34.0–46.6)
Hemoglobin: 12.4 g/dL (ref 11.1–15.9)
MCH: 30.9 pg (ref 26.6–33.0)
MCHC: 32.9 g/dL (ref 31.5–35.7)
MCV: 94 fL (ref 79–97)
Platelets: 259 10*3/uL (ref 150–450)
RBC: 4.01 x10E6/uL (ref 3.77–5.28)
RDW: 13.3 % (ref 11.7–15.4)
WBC: 6.2 10*3/uL (ref 3.4–10.8)

## 2022-11-19 LAB — BASIC METABOLIC PANEL
BUN/Creatinine Ratio: 8 — ABNORMAL LOW (ref 12–28)
BUN: 10 mg/dL (ref 8–27)
CO2: 24 mmol/L (ref 20–29)
Calcium: 9.1 mg/dL (ref 8.7–10.3)
Chloride: 106 mmol/L (ref 96–106)
Creatinine, Ser: 1.32 mg/dL — ABNORMAL HIGH (ref 0.57–1.00)
Glucose: 110 mg/dL — ABNORMAL HIGH (ref 70–99)
Potassium: 3.9 mmol/L (ref 3.5–5.2)
Sodium: 144 mmol/L (ref 134–144)
eGFR: 43 mL/min/{1.73_m2} — ABNORMAL LOW (ref >59)

## 2022-11-19 MED ORDER — POTASSIUM CHLORIDE ER 10 MEQ PO TBCR: 10.0000 meq | EXTENDED_RELEASE_TABLET | Freq: Every day | ORAL | 3 refills | Status: DC

## 2022-11-24 ENCOUNTER — Telehealth: Payer: Self-pay | Admitting: Cardiology

## 2022-11-24 NOTE — Telephone Encounter (Signed)
Calling with questions concerning the medication for her CT. Please Wendy Downs

## 2022-11-24 NOTE — Telephone Encounter (Signed)
Pt calling to verify she is to take Metoprolol Tartrate prior to her CT scan.  Advised this is correct to take 1 tablet 2 hours prior to CT scan.  She had no further questions/concerns.

## 2022-11-25 ENCOUNTER — Telehealth (HOSPITAL_COMMUNITY): Payer: Self-pay | Admitting: *Deleted

## 2022-11-25 NOTE — Telephone Encounter (Signed)
Reaching out to patient to offer assistance regarding upcoming cardiac imaging study; pt verbalizes understanding of appt date/time, parking situation and where to check in, pre-test NPO status and medications ordered, and verified current allergies; name and call back number provided for further questions should they arise  Gordy Clement RN Navigator Cardiac Imaging [facility] [phone] office [phone] cell  Patient to hold her PM Nebivolol and take 65m metoprolol tartrate two hours prior to her cardiac CT scan. She is aware to arrive at 8am.

## 2022-11-26 ENCOUNTER — Ambulatory Visit (HOSPITAL_COMMUNITY)
Admission: RE | Admit: 2022-11-26 | Discharge: 2022-11-26 | Disposition: A | Discharge status: Home / Self Care | Payer: Medicare Other | Source: Ambulatory Visit | Attending: Nurse Practitioner | Admitting: Nurse Practitioner

## 2022-11-26 DIAGNOSIS — I2510 Atherosclerotic heart disease of native coronary artery without angina pectoris: Secondary | ICD-10-CM | POA: Diagnosis not present

## 2022-11-26 DIAGNOSIS — R072 Precordial pain: Secondary | ICD-10-CM | POA: Insufficient documentation

## 2022-11-26 MED ORDER — NITROGLYCERIN 0.4 MG SL SUBL
SUBLINGUAL_TABLET | SUBLINGUAL | Status: AC
  Filled 2022-11-26: qty 2

## 2022-11-26 MED ORDER — NITROGLYCERIN 0.4 MG SL SUBL
0.8000 mg | SUBLINGUAL_TABLET | Freq: Once | SUBLINGUAL | Status: AC
  Administered 2022-11-26: 0.8 mg via SUBLINGUAL

## 2022-11-26 MED ORDER — IOHEXOL 350 MG/ML SOLN
100.0000 mL | Freq: Once | INTRAVENOUS | Status: AC | PRN
Start: 2022-11-26 — End: 2022-11-26
  Administered 2022-11-26: 100 mL via INTRAVENOUS

## 2022-11-30 ENCOUNTER — Telehealth: Payer: Self-pay | Admitting: Cardiology

## 2022-11-30 NOTE — Telephone Encounter (Signed)
FINDINGS: Calcified mediastinal and hilar lymphadenopathy evident suggesting prior granulomatous disease.   The visualized lung parenchyma shows no suspicious pulmonary nodule or mass. No focal airspace consolidation. No effusion.   Postoperative changes noted in the region of the esophagogastric junction.   No suspicious lytic or sclerotic osseous abnormality.   IMPRESSION: Calcified mediastinal and hilar lymphadenopathy suggesting prior granulomatous disease. Given no comparison studies, consider CT chest without contrast to establish more definitive characterization of the thorax as sarcoidosis is also a differential consideration.   These results will be called to the ordering clinician or representative by the Radiologist Assistant, and communication documented in the PACS or [employer].  Do you want to order chest ct without contrast that is what was recommended ?

## 2022-11-30 NOTE — Telephone Encounter (Signed)
I am planning to discuss these findings as well as the coronary findings at upcoming appointment to determine further testing.  Thank you.

## 2022-11-30 NOTE — Telephone Encounter (Signed)
Wendy Downs with [facility] Radiology calling with abnormal CT results

## 2022-12-03 ENCOUNTER — Ambulatory Visit
Admission: RE | Admit: 2022-12-03 | Discharge: 2022-12-03 | Disposition: A | Discharge status: Home / Self Care | Payer: Medicare Other | Source: Ambulatory Visit | Attending: Obstetrics and Gynecology | Admitting: Obstetrics and Gynecology

## 2022-12-03 DIAGNOSIS — Z1231 Encounter for screening mammogram for malignant neoplasm of breast: Secondary | ICD-10-CM | POA: Diagnosis not present

## 2022-12-07 DIAGNOSIS — E538 Deficiency of other specified B group vitamins: Secondary | ICD-10-CM | POA: Diagnosis not present

## 2022-12-15 ENCOUNTER — Ambulatory Visit: Payer: Medicare Other | Admitting: Dietician

## 2022-12-16 ENCOUNTER — Ambulatory Visit (HOSPITAL_COMMUNITY): Payer: Medicare Other | Attending: Internal Medicine

## 2022-12-16 DIAGNOSIS — R0609 Other forms of dyspnea: Secondary | ICD-10-CM

## 2022-12-16 DIAGNOSIS — R072 Precordial pain: Secondary | ICD-10-CM

## 2022-12-16 DIAGNOSIS — I1A0 Resistant hypertension: Secondary | ICD-10-CM | POA: Diagnosis not present

## 2022-12-16 LAB — ECHOCARDIOGRAM COMPLETE
Area-P 1/2: 3.66 cm2
S' Lateral: 3 cm

## 2022-12-19 NOTE — Progress Notes (Signed)
Cardiology Office Note:    Date:  12/24/2022   ID:  Wendy Downs, DOB 11/16/1950, MRN [id number]  PCP:  Merri Brunette, MD   [facility] Providers Cardiologist:  Donato Schultz, MD    Referring MD: Merri Brunette, MD   Chief Complaint: shortness of breath  History of Present Illness:    Wendy Downs is a very pleasant 72 y.o. female with a hx of difficult to control hypertension, TIA, morbid obesity, HLD, palpitations, PVCs  She establish care with cardiology for management of hypertension.She had a normal renal duplex. Hospitalization 09/2019 secondary to TIA. She had 5 minutes of word finding difficulty and slurred speech.  MRI was negative for acute infarct. Neurologist recommended aspirin and Plavix for 21 days followed by aspirin alone. Etiology possibly TIA vs migraine.  Normal 2D echo, mild carotid plaque. In July 2021 she reported weight loss of close to 60 pounds over the previous year. Seen by pulmonology for shortness of breath.  PFTs showed restriction 2/2 obesity, no evidence of sarcoidosis. She reported intolerance of statins and developed gout on HCTZ.  Last cardiology clinic visit was 07/09/2022 with Dr. Anne Fu at which time she reported her PCP had been working on medication management for hypertension.  She was currently tolerating hydralazine and nebivolol.  Verapamil was tried but caused lower extremity edema.  In the past, she had difficulty with prerenal azotemia/dehydration with spironolactone. Intentional weight loss of additional 20 lbs. she reported she was not using CPAP due to sinusitis.  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 No medication changes were made and 1 year follow-up was recommended.   Seen in cardiology clinic by me on 11/18/22 for evaluation of recent worsening of shortness of breath. Started noting shortness of breath approximately 1 week prior when walking a few  feet to dumpster. Also accompanied by chest discomfort that radiates into back. No diaphoresis or n/v. Symptoms improved with rest.  Noted that her legs feel very heavy when going up steps. Feels that equilibrium is off - at times feels off balance but no falls. 2 days prior had an episode of feeling hot and diaphoretic, like a "hot flash." Checked her BP which was 160 /108 then 139/88. PCP has been adjusting antihypertensives. Recently had low BP at PCP 105/60 mmHg. Was advised to push fluids because she has frequent diarrhea. BP remained low so telmisartan was reduced to 40 mg daily. Admitted she did not take nebivolol routinely. Admitted to dietary indiscretion with sodium, eats potato chips daily. Has been working with a nutritionist due to IBS. No palpitations, presyncope, syncope. Further cardiac testing was ordered. Advised her to increase furosemide to 40 mg for 2 days.   Coronary CTA 11/26/22 revealed coronary calcium score of 447, 91st percentile for age/sex, normal coronary origin with right dominance, scattered calcified plaque. First OM 50-69% stenosis. Cannot comment on ostial calcified RCA.  Could consider cardiac catheterization to clarify anatomy.  Aortic valve sclerosis/calcium score 2527, recommend echocardiogram. TTE 12/16/2022 revealed normal LVEF 55 to 60%, no RWMA, indeterminate diastolic parameters, normal RV, mild mitral regurgitation, trivial aortic regurgitation.   Today, she is here for follow-up and is accompanied by her sister.  I asked her to come in today to review CT and echo results in light of her symptoms. She reports her shortness of breath has improved since she started taking Zyrtec. Has a full flight of stairs in her home which  she is up and down throughout the day without chest pain or shortness of breath. No palpitations, orthopnea, PND, presyncope. Has chronic mild bilateral LE edema that she feels is stable. Had a burning discomfort in stomach that resolved after stoppimg  asa. She was taking it at night on an empty stomach. No melena or other bleeding concerns. Recently got a book on Mediterranean diet.   Past Medical History:  Diagnosis Date   Diabetes mellitus without complication (HCC)    Gout    Hyperlipidemia    Hypertension    IBS (irritable bowel syndrome)    Morbid obesity (HCC)    Shingles     Past Surgical History:  Procedure Laterality Date   ABDOMINAL HYSTERECTOMY     APPENDECTOMY     CARPAL TUNNEL RELEASE     CHOLECYSTECTOMY      Current Medications: Current Meds  Medication Sig   albuterol (PROVENTIL HFA;VENTOLIN HFA) 108 (90 Base) MCG/ACT inhaler TAKE 2 PUFFS BY MOUTH EVERY 4 HOURS AS NEEDED   allopurinol (ZYLOPRIM) 100 MG tablet Take 200 mg by mouth daily.    aspirin EC 81 MG tablet Take 81 mg by mouth daily.   Calcium Carbonate (CALCIUM 500 PO) Take by mouth. 3 times daily spaced 2 hours apart.   cetirizine (ZYRTEC) 10 MG tablet Take 10 mg by mouth daily as needed for allergies.   cyanocobalamin (,VITAMIN B-12,) 1000 MCG/ML injection Inject 1,000 mcg into the muscle every 30 (thirty) days.   ezetimibe (ZETIA) 10 MG tablet Take 1 tablet (10 mg total) by mouth daily.   furosemide (LASIX) 20 MG tablet Take 20 mg by mouth daily.   latanoprost (XALATAN) 0.005 % ophthalmic solution Place 1 drop into both eyes at bedtime.   Multiple Vitamins-Minerals (MULTIVITAMIN ADULTS PO) Take by mouth. Bariatric multivitamin   Nebivolol HCl 20 MG TABS Take 20 mg by mouth daily.   pantoprazole (PROTONIX) 40 MG tablet Take 80 mg by mouth daily.   potassium chloride (KLOR-CON) 10 MEQ tablet Take 1 tablet (10 mEq total) by mouth daily.   prednisoLONE acetate (PRED FORTE) 1 % ophthalmic suspension Place 1 drop into the left eye in the morning and at bedtime.   Probiotic Product (PROBIOTIC-10 PO) Take by mouth.   rosuvastatin (CRESTOR) 5 MG tablet Take 1 tablet (5 mg total) by mouth every evening.   telmisartan (MICARDIS) 80 MG tablet Take 80 mg by  mouth daily.   timolol (TIMOPTIC) 0.5 % ophthalmic solution Place 1 drop into both eyes 2 (two) times daily.    TURMERIC CURCUMIN PO Take 900 mg by mouth daily.   VITAMIN D PO Take by mouth.   [DISCONTINUED] ASPIRIN 81 PO Take by mouth.   [DISCONTINUED] telmisartan (MICARDIS) 40 MG tablet Take 40 mg by mouth daily.     Allergies:   Clonidine derivatives, Kiwi extract, Lactose intolerance (gi), Other, Prednisone, Valtrex [valacyclovir hcl], Vaseretic [enalapril-hydrochlorothiazide], Cholestyramine, and Codeine   Social History   Socioeconomic History   Marital status: Divorced    Spouse name: Not on file   Number of children: Not on file   Years of education: Not on file   Highest education level: Not on file  Occupational History   Not on file  Tobacco Use   Smoking status: Never   Smokeless tobacco: Never  Vaping Use   Vaping Use: Never used  Substance and Sexual Activity   Alcohol use: No    Alcohol/week: 0.0 standard drinks of alcohol   Drug use:  No   Sexual activity: Not on file  Other Topics Concern   Not on file  Social History Narrative   Not on file   Social Determinants of Health   Financial Resource Strain: Not on file  Food Insecurity: No Food Insecurity (09/06/2021)   Hunger Vital Sign    Worried About Running Out of Food in the Last Year: Never true    Ran Out of Food in the Last Year: Never true  Transportation Needs: Not on file  Physical Activity: Not on file  Stress: Not on file  Social Connections: Not on file     Family History: The patient's family history includes Aneurysm in her brother; Heart attack in her mother; Heart disease in her brother and sister; Hypertension in her mother; Stroke in her brother, father, sister, and sister.  ROS:   Please see the history of present illness.   All other systems reviewed and are negative.  Labs/Other Studies Reviewed:    The following studies were reviewed today:  Echo 12/16/22  1. Left ventricular  ejection fraction, by estimation, is 55 to 60%. The  left ventricle has normal function. The left ventricle has no regional  wall motion abnormalities. Left ventricular diastolic parameters are  indeterminate.   2. Right ventricular systolic function is normal. The right ventricular  size is normal.   3. The mitral valve is normal in structure. Mild mitral valve  regurgitation.   4. The aortic valve is tricuspid. Aortic valve regurgitation is trivial.   5. The inferior vena cava is normal in size with greater than 50%  respiratory variability, suggesting right atrial pressure of 3 mmHg.  Coronary CTA 11/30/22 IMPRESSION: 1. Coronary calcium score of 447. This was 35 percentile for age and sex matched control.   2. Normal coronary origin with right dominance.   3. Scattered calcified plaque. First OM 50-69% stenosis. Can not comment on ostial calcified RCA. Could consider cardiac catheterization to clarify anatomy.   4. Aortic valve sclerosis/ calcium score: 2527. Recommend echocardiogram.  Cardiac Monitor 03/04/20 Sinus rhythm with frequent PVC's (5% of tracing) with bigeminy and trigeminy No atrial fibrillation No pauses   OK to continue with conservative management of PVC's  Echo 09/23/19 1. Left ventricular ejection fraction, by visual estimation, is 55 to  60%. The left ventricle has normal function. There is mildly increased  left ventricular hypertrophy.   2. Definity contrast agent was given IV to delineate the left ventricular  endocardial borders.   3. Left ventricular diastolic parameters are consistent with Grade I  diastolic dysfunction (impaired relaxation).   4. The left ventricle has no regional wall motion abnormalities.   5. Global right ventricle has normal systolic function.The right  ventricular size is normal. No increase in right ventricular wall  thickness.   6. Left atrial size was mildly dilated.   7. Right atrial size was normal.   8. The mitral  valve is normal in structure. Trivial mitral valve  regurgitation. No evidence of mitral stenosis.   9. The tricuspid valve is normal in structure. Tricuspid valve  regurgitation is trivial.  10. The aortic valve has an indeterminant number of cusps. Aortic valve  regurgitation is trivial. No evidence of aortic valve sclerosis or  stenosis.  11. The pulmonic valve was not well visualized. Pulmonic valve  regurgitation is not visualized.  12. Mildly elevated pulmonary artery systolic pressure.  13. The inferior vena cava is normal in size with greater than 50%  respiratory  variability, suggesting right atrial pressure of 3 mmHg.  14. The interatrial septum was not well visualized.    Recent Labs: 11/18/2022: BUN 10; Creatinine, Ser 1.32; Hemoglobin 12.4; Platelets 259; Potassium 3.9; Sodium 144  Recent Lipid Panel    Component Value Date/Time   CHOL 164 09/23/2019 0519   TRIG 110 09/23/2019 0519   HDL 34 (L) 09/23/2019 0519   CHOLHDL 4.8 09/23/2019 0519   VLDL 22 09/23/2019 0519   LDLCALC 108 (H) 09/23/2019 0519     Risk Assessment/Calculations:       Physical Exam:    VS:  BP (!) 146/90 (BP Location: Right Arm, Patient Position: Sitting, Cuff Size: Large)   Pulse 72   Ht 5' 3.5" (1.613 m)   Wt 219 lb (99.3 kg)   SpO2 99%   BMI 38.19 kg/m     Wt Readings from Last 3 Encounters:  12/24/22 219 lb (99.3 kg)  11/18/22 219 lb 3.2 oz (99.4 kg)  07/09/22 221 lb (100.2 kg)     GEN: Obese, well developed in no acute distress HEENT: Normal NECK: No JVD; No carotid bruits CARDIAC: Irregular RR, 2/6 systolic murmur. No rubs, gallops RESPIRATORY:  Clear to auscultation without rales, wheezing or rhonchi  ABDOMEN: Soft, non-tender, non-distended MUSCULOSKELETAL:  No edema; No deformity. 2+ pedal pulses, equal bilaterally SKIN: Warm and dry NEUROLOGIC:  Alert and oriented x 3 PSYCHIATRIC:  Normal affect   EKG:  EKG is ordered today.  The ekg ordered today demonstrates sinus  rhythm at 72 bpm with frequent PVCs, nonspecific T wave abnormality  Diagnoses:    1. Dyspnea on exertion   2. Essential hypertension   3. Hyperlipidemia LDL goal <70   4. Chronic heart failure with preserved ejection fraction (HFpEF) (HCC)   5. PVC (premature ventricular contraction)   6. Nonrheumatic mitral valve regurgitation   7. Murmur   8. Coronary artery disease involving native coronary artery of native heart without angina pectoris     Assessment and Plan:     HFpEF/DOE: Reports previously reported dyspnea on exertion has improved since she started taking allergy medication. Able to go up and down stairs at home without shortness of breath or chest pain. Chronic LE edema is stable. Weight is stable. No orthopnea, PND.  Body habitus makes it difficult to assess volume status. Echo 12/16/2022 revealed normal LVEF 55 to 60%, indeterminate diastolic parameters, no rwma, normal RV, mild MR. Encouraged low sodium diet < 2000  mg daily, 150 minutes of moderate intensity exercise each week. Continue Lasix, nebivolol, telmisartan.  CAD without angina: Coronary CTA 11/26/2022 revealed calcium score 447 (91%) with 50 to 69% stenosis first OM, calcified ostial RCA, 0 to 24% stenosis pLAD and pLCx. She denies chest pain, dyspnea, or other symptoms concerning for angina. Reports previously reported DOE and leg heaviness has resolved since she started Zyrtec. Is working on increasing exercise and improving diet. No indication for further ischemic evaluation at this time.  Advised her to resume aspirin, take with food.  As noted below need to work on better cholesterol control. Advised her to notify us if she becomes symptomatic. Secondary risk reduction emphasized. Continue nebivolol, telmisartan.   Murmur/Mild MR: 2/6 systolic murmur on exam. History of murmur. Mild MR on echo 12/16/22. Asymptomatic. Will continue to follow clinically for now.   Hypertension: BP elevated today. She admits to elevated  readings at home as well. We will increase telmisartan to 80 mg daily. Admits to eating saltines recently for stomach  discomfort. Reports PCP checks BP once per month. She will ask her to check kidney function as well.  PVCs: Cardiac monitor 02/2020 sinus rhythm with frequent PVCs (5% tracing) with bigeminy and trigeminy. Normal LVEF on echo 12/16/22. She is asymptomatic. Continue nebivolol.   Hyperlipidemia LDL goal < 70: LDL 92 on 09/21/22.  Emphasized the importance of LDL goal < 70 due to CAD. Previously had severe knee discomfort on atorvastatin. Also had some discomfort on rosuvastatin but not as severe. Not currently on any treatment for cholesterol. She is agreeable to try rosuvastatin 5 mg along with ezetimibe 10 mg daily. We will recheck fasting lipid panel in 2 to 3 months. Encouraged heart healthy, mostly plant based diet.     Disposition: 6 months with Dr. Anne Fu or APP  Medication Adjustments/Labs and Tests Ordered: Current medicines are reviewed at length with the patient today.  Concerns regarding medicines are outlined above.  Orders Placed This Encounter  Procedures   ALT   Lipid Profile   Meds ordered this encounter  Medications   rosuvastatin (CRESTOR) 5 MG tablet    Sig: Take 1 tablet (5 mg total) by mouth every evening.    Dispense:  90 tablet    Refill:  3   ezetimibe (ZETIA) 10 MG tablet    Sig: Take 1 tablet (10 mg total) by mouth daily.    Dispense:  90 tablet    Refill:  3    Patient Instructions  Medication Instructions:   INCREASE Telmisartan one (1) tablet by mouth ( 80 mg) daily.   START Rosuvastatin one ( 1) tablet by mouth ( 5 mg) every evening.  START Zetia one (1) tablet by mouth (10 mg) daily.   *If you need a refill on your cardiac medications before your next appointment, please call your pharmacy*   Lab Work:  Your physician recommends that you return for a FASTING lipid profile/ALT on Monday, June 10. You can come in on the day of your  appointment anytime between 7:30-4:30 fasting from midnight the night before.     If you have labs (blood work) drawn today and your tests are completely normal, you will receive your results only by: MyChart Message (if you have MyChart) OR A paper copy in the mail If you have any lab test that is abnormal or we need to change your treatment, we will call you to review the results.   Testing/Procedures:  None ordered.   Follow-Up: At [facility], you and your health needs are our priority.  As part of our continuing mission to provide you with exceptional heart care, we have created designated Provider Care Teams.  These Care Teams include your primary Cardiologist (physician) and Advanced Practice Providers (APPs -  Physician Assistants and Nurse Practitioners) who all work together to provide you with the care you need, when you need it.  We recommend signing up for the patient portal called "MyChart".  Sign up information is provided on this After Visit Summary.  MyChart is used to connect with patients for Virtual Visits (Telemedicine).  Patients are able to view lab/test results, encounter notes, upcoming appointments, etc.  Non-urgent messages can be sent to your provider as well.   To learn more about what you can do with MyChart, go to [email or web address].    Your next appointment:   6 month(s)  Provider:   Donato Schultz, MD     Other Instructions  HOW TO TAKE YOUR BLOOD PRESSURE  Rest 5  minutes before taking your blood pressure. Don't  smoke or drink caffeinated beverages for at least 30 minutes before. Take your blood pressure before (not after) you eat. Sit comfortably with your back supported and both feet on the floor ( don't cross your legs). Elevate your arm to heart level on a table or a desk. Use the proper sized cuff.  It should fit smoothly and snugly around your bare upper arm.  There should be  Enough room to slip a fingertip under the cuff.  The  bottom edge of the cuff should be 1 inch above the crease Of the elbow. Please monitor your blood pressure once daily 2 hours after your am medication. If you blood pressure Consistently remains above 140 (systolic) top number or over 80 ( diastolic) bottom number X 3 days  Consecutively.  Please call our office at [phone] or send Mychart message.     ----Avoid cold medicines with D or DM at the end of them----  Mediterranean Diet A Mediterranean diet refers to food and lifestyle choices that are based on the traditions of countries located on the [employer]. It focuses on eating more fruits, vegetables, whole grains, beans, nuts, seeds, and heart-healthy fats, and eating less dairy, meat, eggs, and processed foods with added sugar, salt, and fat. This way of eating has been shown to help prevent certain conditions and improve outcomes for people who have chronic diseases, like kidney disease and heart disease. What are tips for following this plan? Reading food labels Check the serving size of packaged foods. For foods such as rice and pasta, the serving size refers to the amount of cooked product, not dry. Check the total fat in packaged foods. Avoid foods that have saturated fat or trans fats. Check the ingredient list for added sugars, such as corn syrup. Shopping  Buy a variety of foods that offer a balanced diet, including: Fresh fruits and vegetables (produce). Grains, beans, nuts, and seeds. Some of these may be available in unpackaged forms or large amounts (in bulk). Fresh seafood. Poultry and eggs. Low-fat dairy products. Buy whole ingredients instead of prepackaged foods. Buy fresh fruits and vegetables in-season from local farmers markets. Buy plain frozen fruits and vegetables. If you do not have access to quality fresh seafood, buy precooked frozen shrimp or canned fish, such as tuna, salmon, or sardines. Stock your pantry so you always have certain foods on  hand, such as olive oil, canned tuna, canned tomatoes, rice, pasta, and beans. Cooking Cook foods with extra-virgin olive oil instead of using butter or other vegetable oils. Have meat as a side dish, and have vegetables or grains as your main dish. This means having meat in small portions or adding small amounts of meat to foods like pasta or stew. Use beans or vegetables instead of meat in common dishes like chili or lasagna. Experiment with different cooking methods. Try roasting, broiling, steaming, and sauting vegetables. Add frozen vegetables to soups, stews, pasta, or rice. Add nuts or seeds for added healthy fats and plant protein at each meal. You can add these to yogurt, salads, or vegetable dishes. Marinate fish or vegetables using olive oil, lemon juice, garlic, and fresh herbs. Meal planning Plan to eat one vegetarian meal one day each week. Try to work up to two vegetarian meals, if possible. Eat seafood two or more times a week. Have healthy snacks readily available, such as: Vegetable sticks with hummus. Greek yogurt. Fruit and nut trail mix. Eat balanced  meals throughout the week. This includes: Fruit: 2-3 servings a day. Vegetables: 4-5 servings a day. Low-fat dairy: 2 servings a day. Fish, poultry, or lean meat: 1 serving a day. Beans and legumes: 2 or more servings a week. Nuts and seeds: 1-2 servings a day. Whole grains: 6-8 servings a day. Extra-virgin olive oil: 3-4 servings a day. Limit red meat and sweets to only a few servings a month. Lifestyle  Cook and eat meals together with your family, when possible. Drink enough fluid to keep your urine pale yellow. Be physically active every day. This includes: Aerobic exercise like running or swimming. Leisure activities like gardening, walking, or housework. Get 7-8 hours of sleep each night. If recommended by your health care provider, drink red wine in moderation. This means 1 glass a day for nonpregnant women  and 2 glasses a day for men. A glass of wine equals 5 oz (150 mL). What foods should I eat? Fruits Apples. Apricots. Avocado. Berries. Bananas. Cherries. Dates. Figs. Grapes. Lemons. Melon. Oranges. Peaches. Plums. Pomegranate. Vegetables Artichokes. Beets. Broccoli. Cabbage. Carrots. Eggplant. Green beans. Chard. Kale. Spinach. Onions. Leeks. Peas. Squash. Tomatoes. Peppers. Radishes. Grains Whole-grain pasta. Brown rice. Bulgur wheat. Polenta. Couscous. Whole-wheat bread. Orpah Cobb. Meats and other proteins Beans. Almonds. Sunflower seeds. Pine nuts. Peanuts. Cod. Salmon. Scallops. Shrimp. Tuna. Tilapia. Clams. Oysters. Eggs. Poultry without skin. Dairy Low-fat milk. Cheese. Greek yogurt. Fats and oils Extra-virgin olive oil. Avocado oil. Grapeseed oil. Beverages Water. Red wine. Herbal tea. Sweets and desserts Greek yogurt with honey. Baked apples. Poached pears. Trail mix. Seasonings and condiments Basil. Cilantro. Coriander. Cumin. Mint. Parsley. Sage. Rosemary. Tarragon. Garlic. Oregano. Thyme. Pepper. Balsamic vinegar. Tahini. Hummus. Tomato sauce. Olives. Mushrooms. The items listed above may not be a complete list of foods and beverages you can eat. Contact a dietitian for more information. What foods should I limit? This is a list of foods that should be eaten rarely or only on special occasions. Fruits Fruit canned in syrup. Vegetables Deep-fried potatoes (french fries). Grains Prepackaged pasta or rice dishes. Prepackaged cereal with added sugar. Prepackaged snacks with added sugar. Meats and other proteins Beef. Pork. Lamb. Poultry with skin. Hot dogs. Tomasa Blase. Dairy Ice cream. Sour cream. Whole milk. Fats and oils Butter. Canola oil. Vegetable oil. Beef fat (tallow). Lard. Beverages Juice. Sugar-sweetened soft drinks. Beer. Liquor and spirits. Sweets and desserts Cookies. Cakes. Pies. Candy. Seasonings and condiments Mayonnaise. Pre-made sauces and  marinades. The items listed above may not be a complete list of foods and beverages you should limit. Contact a dietitian for more information. Summary The Mediterranean diet includes both food and lifestyle choices. Eat a variety of fresh fruits and vegetables, beans, nuts, seeds, and whole grains. Limit the amount of red meat and sweets that you eat. If recommended by your health care provider, drink red wine in moderation. This means 1 glass a day for nonpregnant women and 2 glasses a day for men. A glass of wine equals 5 oz (150 mL). This information is not intended to replace advice given to you by your health care provider. Make sure you discuss any questions you have with your health care provider. Document Revised: 11/03/2019 Document Reviewed: 08/31/2019 Elsevier Patient Education  2023 [employer]. Adopting a Healthy Lifestyle.   Weight: Know what a healthy weight is for you (roughly BMI <25) and aim to maintain this. You can calculate your body mass index on your smart phone  Diet: Aim for 7+ servings of fruits and  vegetables daily Limit animal fats in diet for cholesterol and heart health - choose grass fed whenever available Avoid highly processed foods (fast food burgers, tacos, fried chicken, pizza, hot dogs, french fries)  Saturated fat comes in the form of butter, lard, coconut oil, margarine, partially hydrogenated oils, and fat in meat. These increase your risk of cardiovascular disease.  Use healthy plant oils, such as olive, canola, soy, corn, sunflower and peanut.  Whole foods such as fruits, vegetables and whole grains have fiber  Men need > 38 grams of fiber per day Women need > 25 grams of fiber per day  Load up on vegetables and fruits - one-half of your plate: Aim for color and variety, and remember that potatoes dont count. Go for whole grains - one-quarter of your plate: Whole wheat, barley, wheat berries, quinoa, oats, brown rice, and foods made with them. If  you want pasta, go with whole wheat pasta. Protein power - one-quarter of your plate: Fish, chicken, beans, and nuts are all healthy, versatile protein sources. Limit red meat. You need carbohydrates for energy! The type of carbohydrate is more important than the amount. Choose carbohydrates such as vegetables, fruits, whole grains, beans, and nuts in the place of white rice, white pasta, potatoes (baked or fried), macaroni and cheese, cakes, cookies, and donuts.  If youre thirsty, drink water. Coffee and tea are good in moderation, but skip sugary drinks and limit milk and dairy products to one or two daily servings. Keep sugar intake at 6 teaspoons or 24 grams or LESS       Exercise: Aim for 150 min of moderate intensity exercise weekly for heart health, and weights twice weekly for bone health Stay active - any steps are better than no steps! Aim for 7-9 hours of sleep daily             Signed, Levi Aland, NP  12/24/2022 10:05 AM    [facility]

## 2022-12-24 ENCOUNTER — Encounter: Payer: Self-pay | Admitting: Nurse Practitioner

## 2022-12-24 ENCOUNTER — Ambulatory Visit: Payer: Medicare Other | Attending: Nurse Practitioner | Admitting: Nurse Practitioner

## 2022-12-24 VITALS — BP 146/90 | HR 72 | Ht 63.5 in | Wt 219.0 lb

## 2022-12-24 DIAGNOSIS — I5032 Chronic diastolic (congestive) heart failure: Secondary | ICD-10-CM

## 2022-12-24 DIAGNOSIS — I340 Nonrheumatic mitral (valve) insufficiency: Secondary | ICD-10-CM | POA: Diagnosis not present

## 2022-12-24 DIAGNOSIS — I493 Ventricular premature depolarization: Secondary | ICD-10-CM

## 2022-12-24 DIAGNOSIS — I10 Essential (primary) hypertension: Secondary | ICD-10-CM

## 2022-12-24 DIAGNOSIS — I2510 Atherosclerotic heart disease of native coronary artery without angina pectoris: Secondary | ICD-10-CM | POA: Diagnosis not present

## 2022-12-24 DIAGNOSIS — E785 Hyperlipidemia, unspecified: Secondary | ICD-10-CM

## 2022-12-24 DIAGNOSIS — R011 Cardiac murmur, unspecified: Secondary | ICD-10-CM

## 2022-12-24 DIAGNOSIS — R0609 Other forms of dyspnea: Secondary | ICD-10-CM

## 2022-12-24 MED ORDER — EZETIMIBE 10 MG PO TABS: 10.0000 mg | ORAL_TABLET | Freq: Every day | ORAL | 3 refills | Status: DC

## 2022-12-24 MED ORDER — ROSUVASTATIN CALCIUM 5 MG PO TABS: 5.0000 mg | ORAL_TABLET | Freq: Every evening | ORAL | 3 refills | Status: DC

## 2022-12-24 NOTE — Patient Instructions (Addendum)
Medication Instructions:   INCREASE Telmisartan one (1) tablet by mouth ( 80 mg) daily.   START Rosuvastatin one ( 1) tablet by mouth ( 5 mg) every evening.  START Zetia one (1) tablet by mouth (10 mg) daily.   *If you need a refill on your cardiac medications before your next appointment, please call your pharmacy*   Lab Work:  Your physician recommends that you return for a FASTING lipid profile/ALT on Monday, June 10. You can come in on the day of your appointment anytime between 7:30-4:30 fasting from midnight the night before.     If you have labs (blood work) drawn today and your tests are completely normal, you will receive your results only by: [place] (if you have MyChart) OR A paper copy in the mail If you have any lab test that is abnormal or we need to change your treatment, we will call you to review the results.   Testing/Procedures:  None ordered.   Follow-Up: At [facility], you and your health needs are our priority.  As part of our continuing mission to provide you with exceptional heart care, we have created designated Provider Care Teams.  These Care Teams include your primary Cardiologist (physician) and Advanced Practice Providers (APPs -  Physician Assistants and Nurse Practitioners) who all work together to provide you with the care you need, when you need it.  We recommend signing up for the patient portal called "MyChart".  Sign up information is provided on this After Visit Summary.  MyChart is used to connect with patients for Virtual Visits (Telemedicine).  Patients are able to view lab/test results, encounter notes, upcoming appointments, etc.  Non-urgent messages can be sent to your provider as well.   To learn more about what you can do with MyChart, go to [email or web address].    Your next appointment:   6 month(s)  Provider:   Candee Furbish, MD     Other Instructions  HOW TO TAKE YOUR BLOOD PRESSURE  Rest 5 minutes  before taking your blood pressure. Don't  smoke or drink caffeinated beverages for at least 30 minutes before. Take your blood pressure before (not after) you eat. Sit comfortably with your back supported and both feet on the floor ( don't cross your legs). Elevate your arm to heart level on a table or a desk. Use the proper sized cuff.  It should fit smoothly and snugly around your bare upper arm.  There should be  Enough room to slip a fingertip under the cuff.  The bottom edge of the cuff should be 1 inch above the crease Of the elbow. Please monitor your blood pressure once daily 2 hours after your am medication. If you blood pressure Consistently remains above [id number] (systolic) top number or over 80 ( diastolic) bottom number X 3 days  Consecutively.  Please call our office at [phone] or send Mychart message.     ----Avoid cold medicines with D or DM at the end of them----  Mediterranean Diet A Mediterranean diet refers to food and lifestyle choices that are based on the traditions of countries located on the [employer]. It focuses on eating more fruits, vegetables, whole grains, beans, nuts, seeds, and heart-healthy fats, and eating less dairy, meat, eggs, and processed foods with added sugar, salt, and fat. This way of eating has been shown to help prevent certain conditions and improve outcomes for people who have chronic diseases, like kidney disease and heart disease. What are tips  for following this plan? Reading food labels Check the serving size of packaged foods. For foods such as rice and pasta, the serving size refers to the amount of cooked product, not dry. Check the total fat in packaged foods. Avoid foods that have saturated fat or trans fats. Check the ingredient list for added sugars, such as corn syrup. Shopping  Buy a variety of foods that offer a balanced diet, including: Fresh fruits and vegetables (produce). Grains, beans, nuts, and seeds. Some of these  may be available in unpackaged forms or large amounts (in bulk). Fresh seafood. Poultry and eggs. Low-fat dairy products. Buy whole ingredients instead of prepackaged foods. Buy fresh fruits and vegetables in-season from local farmers markets. Buy plain frozen fruits and vegetables. If you do not have access to quality fresh seafood, buy precooked frozen shrimp or canned fish, such as tuna, salmon, or sardines. Stock your pantry so you always have certain foods on hand, such as olive oil, canned tuna, canned tomatoes, rice, pasta, and beans. Cooking Cook foods with extra-virgin olive oil instead of using butter or other vegetable oils. Have meat as a side dish, and have vegetables or grains as your main dish. This means having meat in small portions or adding small amounts of meat to foods like pasta or stew. Use beans or vegetables instead of meat in common dishes like chili or lasagna. Experiment with different cooking methods. Try roasting, broiling, steaming, and sauting vegetables. Add frozen vegetables to soups, stews, pasta, or rice. Add nuts or seeds for added healthy fats and plant protein at each meal. You can add these to yogurt, salads, or vegetable dishes. Marinate fish or vegetables using olive oil, lemon juice, garlic, and fresh herbs. Meal planning Plan to eat one vegetarian meal one day each week. Try to work up to two vegetarian meals, if possible. Eat seafood two or more times a week. Have healthy snacks readily available, such as: Vegetable sticks with hummus. Greek yogurt. Fruit and nut trail mix. Eat balanced meals throughout the week. This includes: Fruit: 2-3 servings a day. Vegetables: 4-5 servings a day. Low-fat dairy: 2 servings a day. Fish, poultry, or lean meat: 1 serving a day. Beans and legumes: 2 or more servings a week. Nuts and seeds: 1-2 servings a day. Whole grains: 6-8 servings a day. Extra-virgin olive oil: 3-4 servings a day. Limit red meat and  sweets to only a few servings a month. Lifestyle  Cook and eat meals together with your family, when possible. Drink enough fluid to keep your urine pale yellow. Be physically active every day. This includes: Aerobic exercise like running or swimming. Leisure activities like gardening, walking, or housework. Get 7-8 hours of sleep each night. If recommended by your health care provider, drink red wine in moderation. This means 1 glass a day for nonpregnant women and 2 glasses a day for men. A glass of wine equals 5 oz (150 mL). What foods should I eat? Fruits Apples. Apricots. Avocado. Berries. Bananas. Cherries. Dates. Figs. Grapes. Lemons. Melon. Oranges. Peaches. Plums. Pomegranate. Vegetables Artichokes. Beets. Broccoli. Cabbage. Carrots. Eggplant. Green beans. Chard. Kale. Spinach. Onions. Leeks. Peas. Squash. Tomatoes. Peppers. Radishes. Grains Whole-grain pasta. Brown rice. Bulgur wheat. Polenta. Couscous. Whole-wheat bread. [place]. Meats and other proteins Beans. Almonds. Sunflower seeds. Pine nuts. Peanuts. [place]. Salmon. Scallops. Shrimp. [place]. Tilapia. Clams. Oysters. Eggs. Poultry without skin. Dairy Low-fat milk. Cheese. Greek yogurt. Fats and oils Extra-virgin olive oil. Avocado oil. Grapeseed oil. Beverages Water. Red wine.  Herbal tea. Sweets and desserts Greek yogurt with honey. Baked apples. Poached pears. Trail mix. Seasonings and condiments Basil. Cilantro. Coriander. Cumin. Mint. Parsley. Sage. Rosemary. Tarragon. Garlic. Oregano. Thyme. Pepper. Balsamic vinegar. Tahini. Hummus. Tomato sauce. Olives. Mushrooms. The items listed above may not be a complete list of foods and beverages you can eat. Contact a dietitian for more information. What foods should I limit? This is a list of foods that should be eaten rarely or only on special occasions. Fruits Fruit canned in syrup. Vegetables Deep-fried potatoes (french fries). Grains Prepackaged pasta or rice  dishes. Prepackaged cereal with added sugar. Prepackaged snacks with added sugar. Meats and other proteins Beef. Pork. Lamb. Poultry with skin. Hot dogs. Berniece Salines. Dairy Ice cream. Sour cream. Whole milk. Fats and oils Butter. Canola oil. Vegetable oil. Beef fat (tallow). Lard. Beverages Juice. Sugar-sweetened soft drinks. Beer. Liquor and spirits. Sweets and desserts Cookies. Cakes. Pies. Candy. Seasonings and condiments Mayonnaise. Pre-made sauces and marinades. The items listed above may not be a complete list of foods and beverages you should limit. Contact a dietitian for more information. Summary The Mediterranean diet includes both food and lifestyle choices. Eat a variety of fresh fruits and vegetables, beans, nuts, seeds, and whole grains. Limit the amount of red meat and sweets that you eat. If recommended by your health care provider, drink red wine in moderation. This means 1 glass a day for nonpregnant women and 2 glasses a day for men. A glass of wine equals 5 oz (150 mL). This information is not intended to replace advice given to you by your health care provider. Make sure you discuss any questions you have with your health care provider. Document Revised: 11/03/2019 Document Reviewed: 08/31/2019 Elsevier Patient Education  [place]. Adopting a Healthy Lifestyle.   Weight: Know what a healthy weight is for you (roughly BMI <25) and aim to maintain this. You can calculate your body mass index on your smart phone  Diet: Aim for 7+ servings of fruits and vegetables daily Limit animal fats in diet for cholesterol and heart health - choose grass fed whenever available Avoid highly processed foods (fast food burgers, tacos, fried chicken, pizza, hot dogs, french fries)  Saturated fat comes in the form of butter, lard, coconut oil, margarine, partially hydrogenated oils, and fat in meat. These increase your risk of cardiovascular disease.  Use healthy plant oils, such as  olive, canola, soy, corn, sunflower and peanut.  Whole foods such as fruits, vegetables and whole grains have fiber  Men need > 38 grams of fiber per day Women need > 25 grams of fiber per day  Load up on vegetables and fruits - one-half of your plate: Aim for color and variety, and remember that potatoes dont count. Go for whole grains - one-quarter of your plate: Whole wheat, barley, wheat berries, quinoa, oats, brown rice, and foods made with them. If you want pasta, go with whole wheat pasta. Protein power - one-quarter of your plate: Fish, chicken, beans, and nuts are all healthy, versatile protein sources. Limit red meat. You need carbohydrates for energy! The type of carbohydrate is more important than the amount. Choose carbohydrates such as vegetables, fruits, whole grains, beans, and nuts in the place of white rice, white pasta, potatoes (baked or fried), macaroni and cheese, cakes, cookies, and donuts.  If youre thirsty, drink water. Coffee and tea are good in moderation, but skip sugary drinks and limit milk and dairy products to one or two  daily servings. Keep sugar intake at 6 teaspoons or 24 grams or LESS       Exercise: Aim for 150 min of moderate intensity exercise weekly for heart health, and weights twice weekly for bone health Stay active - any steps are better than no steps! Aim for 7-9 hours of sleep daily

## 2022-12-28 ENCOUNTER — Telehealth: Payer: Self-pay | Admitting: Nurse Practitioner

## 2022-12-28 NOTE — Telephone Encounter (Signed)
Would recommend patient stop zetia and continue rosuvastatin. Keep f/u for repeat labs

## 2022-12-28 NOTE — Telephone Encounter (Signed)
Agree with plan from [facility], [facility].

## 2022-12-28 NOTE — Telephone Encounter (Signed)
Pt c/o medication issue:  1. Name of Medication: Ezetimibe  2. How are you currently taking this medication (dosage and times per day)? 1 time in the morning   3. Are you having a reaction (difficulty breathing--STAT)?   4. What is your medication issue? 1 hour after she take this medicine she get the aura that you get with migraines

## 2022-12-28 NOTE — Telephone Encounter (Signed)
Patient calling in with concerns about zetia and rosuvastatin, which was initiated at her last visit 12/24/22. She states she has a history of migraines and unfortunately, she has had a migraine every day since she started these medications. She states she takes her crestor at night and zetia in the morning. Every day since she started her meds on 3//15/24 she has had a migraine aura one hour after she takes her zetia. She states that if she takes advil early on, she can often avoid a full headache with nausea, vomiting and photosensitivity but often feels very fatigued after experiencing even just the migraine aura. She states she has had to take advil every day since 12/25/22 and this concerns her as her creatinine was elevated on her labs dated 11/18/22. She is wondering if her medications can be adjusted to changed to lessen her chances of experiencing migraines. Forwarded to PharmD and Elwyn Reach, NP.

## 2022-12-28 NOTE — Telephone Encounter (Signed)
Called patient back to endorse Twana First recommendation that patient stop zetia for now and just use rosuvastatin. Repeat labs as scheduled in June and Sharyn Lull will re-evaluate if patient needs additional medication to control cholesterol. Patient states she did not take her zetia today and had no headaches, agrees to plan to try rosuvastatin only and recheck labs in June.

## 2022-12-29 ENCOUNTER — Ambulatory Visit: Payer: Medicare Other | Admitting: Internal Medicine

## 2022-12-29 ENCOUNTER — Encounter: Payer: Self-pay | Admitting: Internal Medicine

## 2022-12-29 VITALS — BP 142/84 | HR 68 | Temp 97.7°F | Ht 63.5 in | Wt 220.4 lb

## 2022-12-29 DIAGNOSIS — D869 Sarcoidosis, unspecified: Secondary | ICD-10-CM | POA: Diagnosis not present

## 2022-12-29 DIAGNOSIS — R0609 Other forms of dyspnea: Secondary | ICD-10-CM

## 2022-12-29 NOTE — Assessment & Plan Note (Signed)
Onset ? 1990s - PFT's  04/30/15   FEV1 1.67 (85 % ) ratio 85  p 0 % improvement from saba p ? prior to study with DLCO  58 % corrects to 80 % for alv volume  And ERV  = 92% at wt 275  -PFT's  11/19/2017  FEV1 1.42 (75 % ) ratio 85  p 6 % improvement from saba p nothing prior to study with DLCO  58 % corrects to 85 % for alv volume  And ERV 68% at wt 285  - CT chest  11/26/22 calcified nodes only   No clinical or CT evidence of active sarcoid, no further pulmonary f/u needed

## 2022-12-29 NOTE — Progress Notes (Signed)
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    last seen 5 years prior to OV   and not on any  prednisone since even before then, main manifestation was ocular > regular f/u for glaucoma/ [facility]  New problem indolent onset progressive/ persistent daily doe x May 2016 indolent onset with exertion initially now sob x across room and also sitting still, hear's wheezing at hs and has had episodes of cough to point of choking p smelling grilled food 03/17/15 assoc with sense of nasal and chest congestion but cough dry/ no purulent secretions/ worse day than noct  rec Ok to try tylenol cold and sinus and call us if not better  Please remember to go to the  Lab and x-ray department downstairs for your tests - we will call you with the results when they are available. Try prilosec 20mg   Take 30-60 min before first meal of the day and Pepcid (famotidine) 20 mg one bedtime until cough is completely gone for at least a week without the need for cough suppression GERD diet     06/12/2020  f/u ov/Duron Meister re: doe/ h/o sarcoid  Chief Complaint  Patient presents with   Follow-up    Breathing is overall doing well. No new co's.    Dyspnea:  Walks 3 x weekly some hills ok Cough: none  Sleeping: 2 pillows/ bed flat  SABA use: none  02: none  Rec F/u prn   Nov 2023 covid rx paxlovid and breathing downhill since   Cardiac w/u  [place] >    12/29/2022  f/u ov/[name] re: doe/ h/o sarcoid  maint on no rx   Chief Complaint  Patient presents with    Follow-up    Some SOB with any exertion.  Patient following cardiology for SOB and chest pain.(EPIC)  Dyspnea:  walking is just steps/ viz limits outside ex / reported to cardiolgy doing steps s sob or cp onn 12/24/22 and "same since"  Cough: no Sleeping: bed is flat/ 2 pillows wakes up rested s resp cc  SABA use: rarely and never pre or rechallenges  02: none     No obvious day to day or daytime variability or assoc excess/ purulent sputum or mucus plugs or hemoptysis or cp or chest tightness, subjective wheeze or overt sinus or hb symptoms.   Sleeping  without nocturnal  or early am exacerbation  of respiratory  c/o's or need for noct saba. Also denies any obvious fluctuation of symptoms with weather or environmental changes or other aggravating or alleviating factors except as outlined above   No unusual exposure hx or h/o childhood pna/ asthma or knowledge of premature birth.  Current Allergies, Complete Past Medical History, Past Surgical History, Family History, and Social History were reviewed in [employer] record.  ROS  The following are not active complaints unless bolded Hoarseness, sore throat, dysphagia, dental problems, itching, sneezing,  nasal congestion or  discharge of excess mucus or purulent secretions, ear ache,   fever, chills, sweats, unintended wt loss or wt gain, classically pleuritic or exertional cp,  orthopnea pnd or arm/hand swelling  or leg swelling, presyncope, palpitations, abdominal pain, anorexia, nausea, vomiting, diarrhea  or change in bowel habits or change in bladder habits, change in stools or change in urine, dysuria, hematuria,  rash, arthralgias, visual complaints, headache, numbness, weakness or ataxia or problems with walking or coordination,  change in mood or  memory.        Current Meds  Medication Sig   albuterol (PROVENTIL HFA;VENTOLIN HFA) 108 (90 Base) MCG/ACT inhaler TAKE 2 PUFFS BY MOUTH EVERY 4 HOURS AS NEEDED    allopurinol (ZYLOPRIM) 100 MG tablet Take 200 mg by mouth daily.    aspirin EC 81 MG tablet Take 81 mg by mouth daily.   cetirizine (ZYRTEC) 10 MG tablet Take 10 mg by mouth daily as needed for allergies.   cyanocobalamin (,VITAMIN B-12,) 1000 MCG/ML injection Inject 1,000 mcg into the muscle every 30 (thirty) days.   furosemide (LASIX) 20 MG tablet Take 20 mg by mouth daily.   latanoprost (XALATAN) 0.005 % ophthalmic solution Place 1 drop into both eyes at bedtime.   Multiple Vitamins-Minerals (MULTIVITAMIN ADULTS PO) Take by mouth. Bariatric multivitamin   Nebivolol HCl 20 MG TABS Take 20 mg by mouth daily.   pantoprazole (PROTONIX) 40 MG tablet Take 80 mg by mouth daily.   potassium chloride (KLOR-CON) 10 MEQ tablet Take 1 tablet (10 mEq total) by mouth daily.   prednisoLONE acetate (PRED FORTE) 1 % ophthalmic suspension Place 1 drop into the left eye in the morning and at bedtime.   Probiotic Product (PROBIOTIC-10 PO) Take by mouth.   rosuvastatin (CRESTOR) 5 MG tablet Take 1 tablet (5 mg total) by mouth every evening.   telmisartan (MICARDIS) 80 MG tablet Take 80 mg by mouth daily.   timolol (TIMOPTIC) 0.5 % ophthalmic solution Place 1 drop into both eyes 2 (two) times daily.    TURMERIC CURCUMIN PO Take 900 mg by mouth daily.   VITAMIN D PO Take by mouth.             Objective:   Physical Exam   12/29/2022       220  06/12/2020         198  11/19/2017         285  03/11/2017       279  04/30/2015       275      03/18/15 272 lb (123.378 kg)  05/24/14 275 lb 6.4 oz (124.921 kg)  02/20/10 264 lb (119.75 kg)      Vital signs reviewed  12/29/2022  - Note at rest 02 sats  98% on RA   General appearance:    amb bf nad    HEENT : Oropharynx  clear      NECK :  without  apparent JVD/ palpable Nodes/TM    LUNGS: no acc muscle use,  Nl contour chest which is clear to A and P bilaterally without cough on insp or exp maneuvers   CV:  RRR  no s3 or murmur or increase in P2, and no  edema   ABD:  soft and nontender    MS:  Nl gait/ ext warm without deformities Or obvious joint restrictions  calf tenderness, cyanosis or clubbing    SKIN: warm and dry without lesions    NEURO:  alert, approp, nl  sensorium with  no motor or cerebellar deficits apparent.       I personally reviewed images and agree with radiology impression as follows:   Chest CT 11/26/22   Calcified mediastinal and hilar lymphadenopathy suggesting prior granulomatous disease. Given no comparison studies, consider CT chest without contrast to establish more definitive characterization of the thorax as sarcoidosis is also a differential consideration. The visualized lung parenchyma shows no suspicious pulmonary nodule or mass        Assessment & Plan:

## 2022-12-29 NOTE — Assessment & Plan Note (Addendum)
Onset 2016  - 03/18/2015   Walked RA x 3 lap @ 159ft each/ brisk pace / tol well s desats - PFTs 04/30/2015    VC 2.32 (93%) no obst dlco 58% corrects 80%   - 03/11/2017  Walked RA x 3 laps @ 185 ft each stopped due to  End of study, nl pace, no   desat - mild sob      -  12/29/2022   Walked on RA  x   3 lap(s) =  approx 750  ft  @ slow to mod pace, stopped due to end of study with lowest 02 sats 95% c/o fatigue but no cp or sob  No evidence of any cardiopulmonary issues on today's eval - have not ruled out EIA or any other form of asthma here so rec just use saba prn:  Re SABA :  I spent extra time with pt today reviewing appropriate use of albuterol for prn use on exertion with the following points: 1) saba is for relief of sob that does not improve by walking a slower pace or resting but rather if the pt does not improve after trying this first. 2) If the pt is convinced, as many are, that saba helps recover from activity faster then it's easy to tell if this is the case by re-challenging : ie stop, take the inhaler, then p 5 minutes try the exact same activity (intensity of workload) that just caused the symptoms and see if they are substantially diminished or not after saba 3) if there is an activity that reproducibly causes the symptoms, try the saba 15 min before the activity on alternate days   If in fact the saba really does help, then fine to continue to use it prn but advised may need to look closer at the maintenance regimen (which for now is 0) being used to achieve better control of airways disease with exertion.   Each maintenance medication was reviewed in detail including emphasizing most importantly the difference between maintenance and prns and under what circumstances the prns are to be triggered using an action plan format where appropriate.  Total time for H and P, chart review, counseling, reviewing hfa device(s) , directly observing portions of ambulatory 02 saturation study/ and  generating customized AVS unique to this office visit / same day charting = 32 min

## 2022-12-29 NOTE — Patient Instructions (Addendum)
Also  Ok to try albuterol x 2puffs x 15 min before an activity (on alternating days)  that you know would usually make you short of breath and see if it makes any difference and if makes none then don't take albuterol after activity unless you can't catch your breath as this means it's the resting that helps, not the albuterol.      .To get the most out of exercise, you need to be continuously aware that you are short of breath, but never out of breath, for at least 30 minutes daily. As you improve, it will actually be easier for you to do the same amount of exercise  in  30 minutes so always push to the level where you are short of breath.     Make sure you check your oxygen saturations at highest level of activity    Follow up is as needed

## 2023-01-05 DIAGNOSIS — E538 Deficiency of other specified B group vitamins: Secondary | ICD-10-CM | POA: Diagnosis not present

## 2023-01-26 ENCOUNTER — Ambulatory Visit: Payer: Medicare Other | Admitting: Nurse Practitioner

## 2023-02-08 DIAGNOSIS — E538 Deficiency of other specified B group vitamins: Secondary | ICD-10-CM | POA: Diagnosis not present

## 2023-02-25 ENCOUNTER — Encounter: Payer: Medicare Other | Attending: Nurse Practitioner | Admitting: Dietician

## 2023-02-25 ENCOUNTER — Encounter: Payer: Self-pay | Admitting: Dietician

## 2023-02-25 DIAGNOSIS — E119 Type 2 diabetes mellitus without complications: Secondary | ICD-10-CM | POA: Diagnosis not present

## 2023-02-25 NOTE — Progress Notes (Signed)
Visit:  [phone]  Patient is here today alone.  She was last seen by another RD in our office on 08/20/2022.  Less diarrhea for the last few months.  Is no longer taking a probiotic.  She says that sometimes it causes worse diarrhea and sometimes it helps. She states that she has had a lot of deaths around her since January and has not wanted to eat as much except for "junk food". She states that her doctor wants her to follow a Mediterranean diet but is concerned as sometimes vegetables cause diarrhea. Sourdough bread is well tolerated. She states that dairy, sugar, and artificial sweeteners. She is not walking other than in the house and fears walkin goutside due to poor vision.  Complains of hair loss. She states that she goes to bed at 8 pm but uses a Happy Color app and sometimes will be awake until 4 am.  History includes:  Type 2 Diabetes, HTN, HLD, gout, IBS, poor vision SMBG: Pt doesn't usually check unless she feels unwell   Medications/supplements: See list to include - potassium, lasix, crestor, telmisartan, nebivolol, vitamin B-12 injections, turmeric, MVI, vitamin D3 Pertinent Lab Values: 12/09/20:  HgbA1c: 5.6 WNL - no knew values Uric Acid: 7.2 H eGFR: 38 AAeGFR: 46   Other Signs/Symptoms: Hair loss  GI: diarrhea   Food Allergies/Intolerances: kiwi allergy; lactose intolerance. Pt reports diarrhea sometimes with high sugar foods, artificial sugars, and berries. Pt also avoid nitrates and rice.    24 Hour Recall: Eats most frequently in her bed Breakfast: None as she sleeps late Snack: None reported.  Lunch:  1/2 chicken, [employer] apple pie Snack: [employer]: Ice cream cone  Dinner:  Chik fil-A sandwich, side salad Snack: Pork rinds  Beverages: water (not enough), regular coke and Pepsi (8 oz daily), black coffee   Physical Activity: None currently.   Individualized Plan for Diabetes Self-Management Training:    Learning Objective:  Patient will have a  greater understanding of diabetes self-management. Patient education plan is to attend individual and/or group sessions per assessed needs and concerns.   Plan:  Discussed patient's stressor's and habits and the relationship to her life, sleep and eating habits.  Discussed better sell care. Discussed putting hard lines to foods that are known to cause her diarrhea. Discussed benefits of positive social interaction  Discussed mindful eating, creating food free zones, and quality of nutrition Discussed tips to integrate exercise in her day.  Instructions/Goals:  Aim to be more active  Silver sneakers [employer] - stationary bike  Walk away the [employer] - you tube  Make a food free zone  Eat in the kitchen or dining room  Put your app on a schedule  - and other things to help your sleep schedule   Avoid foods that cause diarrhea (dairy)  Mindfulness:  Consistently scheduled meal - avoid skipping  Choices - nutrition quality  Eat slowly  Away from distraction (sitting in kitchen or dining room)  Stop eating when satisfied  Before a snack ask, "Am I hungry or eating for another reason?"   "What can I do instead if I am not hungry?"  Try to find something every day that brings you joy!   Eat every 3-4 hours. Recommend 2-3 carbohydrate servings at each meal. For snacks, recommend 1 carbohydrate serving and a protein. Eat 1st meal within 1 hour of waking **Eat every 3-4 hours while awake** **Have balanced meals: protein + starch + veggies** Avoid eating within  3 hours of laying down **Limit eating sweets and sugar sweetened beverages as much as possible** **If having sweets, have with a balanced meal rather than alone.**   If having any dizziness or feeling off, check blood sugar. If 70 or below consume half cup juice and recheck in 15 minutes. Repeat if still 70 or less.    Continue with minimum of 4-5 bottles water daily. If having diarrhea, try for at least 5 bottles  water. **If having very frequent diarrhea (multiple times in a day) sip Gatorade or Pedialyte.**  Avoid fluids 15 minutes before, during, and 30 minutes after meals.   Follow up in 4 months.  Patient to call between appointments for questions.

## 2023-02-25 NOTE — Patient Instructions (Addendum)
Aim to be more active  Silver sneakers [employer] - stationary bike  Walk away the [employer] - you tube  Make a food free zone  Eat in the kitchen or dining room  Put your app on a schedule  - and other things to help your sleep schedule   Avoid foods that cause diarrhea (dairy)  Mindfulness:  Consistently scheduled meal - avoid skipping  Choices - nutrition quality  Eat slowly  Away from distraction (sitting in kitchen or dining room)  Stop eating when satisfied  Before a snack ask, "Am I hungry or eating for another reason?"   "What can I do instead if I am not hungry?"  Try to find something every day that brings you joy!

## 2023-03-09 DIAGNOSIS — H401113 Primary open-angle glaucoma, right eye, severe stage: Secondary | ICD-10-CM | POA: Diagnosis not present

## 2023-03-10 DIAGNOSIS — E538 Deficiency of other specified B group vitamins: Secondary | ICD-10-CM | POA: Diagnosis not present

## 2023-03-22 ENCOUNTER — Ambulatory Visit: Payer: Medicare Other | Attending: Nurse Practitioner

## 2023-03-22 DIAGNOSIS — I5032 Chronic diastolic (congestive) heart failure: Secondary | ICD-10-CM | POA: Diagnosis not present

## 2023-03-22 DIAGNOSIS — I10 Essential (primary) hypertension: Secondary | ICD-10-CM | POA: Diagnosis not present

## 2023-03-22 DIAGNOSIS — E785 Hyperlipidemia, unspecified: Secondary | ICD-10-CM | POA: Diagnosis not present

## 2023-03-23 LAB — LIPID PANEL
Chol/HDL Ratio: 1.9 ratio (ref 0.0–4.4)
Cholesterol, Total: 121 mg/dL (ref 100–199)
HDL: 65 mg/dL (ref >39)
LDL Chol Calc (NIH): 40 mg/dL (ref 0–99)
Triglycerides: 81 mg/dL (ref 0–149)
VLDL Cholesterol Cal: 16 mg/dL (ref 5–40)

## 2023-03-23 LAB — ALT: ALT: 24 IU/L (ref 0–32)

## 2023-03-25 DIAGNOSIS — E119 Type 2 diabetes mellitus without complications: Secondary | ICD-10-CM | POA: Diagnosis not present

## 2023-03-25 DIAGNOSIS — E782 Mixed hyperlipidemia: Secondary | ICD-10-CM | POA: Diagnosis not present

## 2023-03-25 DIAGNOSIS — Z862 Personal history of diseases of the blood and blood-forming organs and certain disorders involving the immune mechanism: Secondary | ICD-10-CM | POA: Diagnosis not present

## 2023-03-25 DIAGNOSIS — I10 Essential (primary) hypertension: Secondary | ICD-10-CM | POA: Diagnosis not present

## 2023-03-25 DIAGNOSIS — K219 Gastro-esophageal reflux disease without esophagitis: Secondary | ICD-10-CM | POA: Diagnosis not present

## 2023-03-25 DIAGNOSIS — M109 Gout, unspecified: Secondary | ICD-10-CM | POA: Diagnosis not present

## 2023-04-08 DIAGNOSIS — E538 Deficiency of other specified B group vitamins: Secondary | ICD-10-CM | POA: Diagnosis not present

## 2023-05-10 DIAGNOSIS — E538 Deficiency of other specified B group vitamins: Secondary | ICD-10-CM | POA: Diagnosis not present

## 2023-05-10 DIAGNOSIS — R21 Rash and other nonspecific skin eruption: Secondary | ICD-10-CM | POA: Diagnosis not present

## 2023-05-10 DIAGNOSIS — I10 Essential (primary) hypertension: Secondary | ICD-10-CM | POA: Diagnosis not present

## 2023-06-01 DIAGNOSIS — H401113 Primary open-angle glaucoma, right eye, severe stage: Secondary | ICD-10-CM | POA: Diagnosis not present

## 2023-06-10 DIAGNOSIS — E538 Deficiency of other specified B group vitamins: Secondary | ICD-10-CM | POA: Diagnosis not present

## 2023-06-23 ENCOUNTER — Ambulatory Visit: Payer: Medicare Other | Attending: Cardiology | Admitting: Cardiology

## 2023-06-23 ENCOUNTER — Encounter: Payer: Self-pay | Admitting: Cardiology

## 2023-06-23 VITALS — BP 148/86 | HR 63 | Ht 64.0 in | Wt 219.0 lb

## 2023-06-23 DIAGNOSIS — I493 Ventricular premature depolarization: Secondary | ICD-10-CM

## 2023-06-23 DIAGNOSIS — I2510 Atherosclerotic heart disease of native coronary artery without angina pectoris: Secondary | ICD-10-CM

## 2023-06-23 DIAGNOSIS — I5032 Chronic diastolic (congestive) heart failure: Secondary | ICD-10-CM

## 2023-06-23 DIAGNOSIS — I340 Nonrheumatic mitral (valve) insufficiency: Secondary | ICD-10-CM | POA: Diagnosis not present

## 2023-06-23 NOTE — Patient Instructions (Signed)
Medication Instructions:  Your physician recommends that you continue on your current medications as directed. Please refer to the Current Medication list given to you today.  *If you need a refill on your cardiac medications before your next appointment, please call your pharmacy*  Lab Work: None ordered today  Testing/Procedures: None ordered today  Follow-Up: At [facility], you and your health needs are our priority.  As part of our continuing mission to provide you with exceptional heart care, we have created designated Provider Care Teams.  These Care Teams include your primary Cardiologist (physician) and Advanced Practice Providers (APPs -  Physician Assistants and Nurse Practitioners) who all work together to provide you with the care you need, when you need it.  Your next appointment:   12 month(s)  The format for your next appointment:   In Person  Provider:   Donato Schultz, MD

## 2023-06-23 NOTE — Progress Notes (Signed)
Cardiology Office Note:  .   Date:  06/23/2023  ID:  Wendy Downs, DOB Jun 04, 1951, MRN [id number] PCP: Merri Brunette, MD  [facility] Providers Cardiologist:  Donato Schultz, MD    History of Present Illness: .   Wendy Downs is a 72 y.o. female Discussed the use of AI scribe software for clinical note transcription with the patient, who gave verbal consent to proceed.  History of Present Illness   [name], with a history of coronary artery disease, diastolic heart failure, and irritable bowel syndrome (IBS), presents for a follow-up visit. She reports feeling well from a cardiac perspective, but continues to struggle with IBS symptoms, including frequent diarrhea and resultant dehydration. She notes that even drinking water can trigger her symptoms. She is currently working with a nutritionist and is exploring dietary modifications to manage her IBS, as she found the previously suggested Mediterranean diet to be unsuitable.  She also reports occasional dizziness, particularly when bending over or after periods of not eating. She has noticed that these episodes of dizziness seem to correlate with periods of increased diarrhea and suspected dehydration. She has not reported these symptoms to her doctor before.  She has been invited to participate in a genetic research study and is considering it. She also mentions a history of migraines and leg swelling with certain medications, which she wishes to avoid in the future.       ROS: No CP, no SOB  Studies Reviewed: Marland Kitchen        LABS LDL: 40 (03/22/2023) Creatinine: 1.32 (11/2022) Hemoglobin: 12.4 (11/2022)  RADIOLOGY Coronary CT scan: Calcium score 447, 91st percentile, scattered calcified plaque, moderate obtuse marginal stenosis, calcified aortic valve without stenosis (11/26/2022)  DIAGNOSTIC Echocardiogram: EF 55-60%, mild mitral regurgitation (12/16/2022) Risk Assessment/Calculations:           Physical Exam:    VS:  BP (!) 148/86   Pulse 63   Ht 5\' 4"  (1.626 m)   Wt 219 lb (99.3 kg)   SpO2 99%   BMI 37.59 kg/m    Wt Readings from Last 3 Encounters:  06/23/23 219 lb (99.3 kg)  12/29/22 220 lb 6.4 oz (100 kg)  12/24/22 219 lb (99.3 kg)    GEN: Well nourished, well developed in no acute distress NECK: No JVD; No carotid bruits CARDIAC: RRR, 1/6 SM, no rubs, gallops RESPIRATORY:  Clear to auscultation without rales, wheezing or rhonchi  ABDOMEN: Soft, non-tender, non-distended EXTREMITIES:  No edema; No deformity   ASSESSMENT AND PLAN: .    Assessment and Plan    Coronary Artery Disease Moderate coronary disease noted on CT scan (OM1). LDL well controlled at 40 on Crestor 5mg  daily. -Continue Crestor 5mg  daily. -ASA 81  Diastolic Heart Failure Echocardiogram shows EF 55-60%, mild mitral regurgitation. Patient on Telmisartan 80mg  daily and Nebivolol 20mg  daily for blood pressure control and diastolic heart failure management. -Continue Telmisartan 80mg  daily and Nebivolol 20mg  daily. -No SOB -Hold lasix if diarrhea   Aortic Valve Calcification Echocardiogram shows calcified aortic valve without stenosis. -No change in management.  Premature Ventricular Contractions (PVCs) History of PVCs noted on previous monitor 2021, 5%. Patient on Nebivolol 20mg  daily. -Continue Nebivolol 20mg  daily.  Chronic Kidney Disease (CKD) Stage 3A Creatinine 1.32 in February 2024. Patient on Furosemide 20mg  daily. -Continue Furosemide 20mg  daily. Advise patient to skip Furosemide on days with excessive diarrhea.  Irritable Bowel Syndrome (IBS) Reports frequent diarrhea leading to dehydration. Patient to see a nutritionist for  dietary management. -Advise patient to maintain hydration and consider skipping Furosemide on days with excessive diarrhea.  General Health Maintenance -Continue Aspirin 81mg  daily for coronary disease. -Encourage 30 minutes of daily exercise. -Plan for annual follow-up  unless changes occur.            Signed, Donato Schultz, MD

## 2023-06-24 ENCOUNTER — Ambulatory Visit: Payer: Medicare Other | Admitting: Dietician

## 2023-07-08 DIAGNOSIS — E538 Deficiency of other specified B group vitamins: Secondary | ICD-10-CM | POA: Diagnosis not present

## 2023-08-04 ENCOUNTER — Other Ambulatory Visit: Payer: Self-pay | Admitting: Family Medicine

## 2023-08-04 DIAGNOSIS — Z1231 Encounter for screening mammogram for malignant neoplasm of breast: Secondary | ICD-10-CM

## 2023-08-11 DIAGNOSIS — E538 Deficiency of other specified B group vitamins: Secondary | ICD-10-CM | POA: Diagnosis not present

## 2023-08-13 ENCOUNTER — Other Ambulatory Visit: Payer: Self-pay

## 2023-08-13 ENCOUNTER — Observation Stay (HOSPITAL_BASED_OUTPATIENT_CLINIC_OR_DEPARTMENT_OTHER)
Admission: EM | Admit: 2023-08-13 | Discharge: 2023-08-15 | Disposition: A | Discharge status: Home / Self Care | Payer: Medicare Other | Attending: Internal Medicine | Admitting: Internal Medicine

## 2023-08-13 ENCOUNTER — Other Ambulatory Visit (HOSPITAL_BASED_OUTPATIENT_CLINIC_OR_DEPARTMENT_OTHER): Payer: Self-pay

## 2023-08-13 DIAGNOSIS — R197 Diarrhea, unspecified: Secondary | ICD-10-CM | POA: Insufficient documentation

## 2023-08-13 DIAGNOSIS — N179 Acute kidney failure, unspecified: Secondary | ICD-10-CM | POA: Diagnosis not present

## 2023-08-13 DIAGNOSIS — D649 Anemia, unspecified: Secondary | ICD-10-CM | POA: Diagnosis not present

## 2023-08-13 DIAGNOSIS — I2510 Atherosclerotic heart disease of native coronary artery without angina pectoris: Secondary | ICD-10-CM | POA: Insufficient documentation

## 2023-08-13 DIAGNOSIS — I959 Hypotension, unspecified: Principal | ICD-10-CM | POA: Diagnosis present

## 2023-08-13 DIAGNOSIS — N1831 Chronic kidney disease, stage 3a: Secondary | ICD-10-CM | POA: Diagnosis not present

## 2023-08-13 DIAGNOSIS — E1122 Type 2 diabetes mellitus with diabetic chronic kidney disease: Secondary | ICD-10-CM | POA: Insufficient documentation

## 2023-08-13 DIAGNOSIS — E11649 Type 2 diabetes mellitus with hypoglycemia without coma: Secondary | ICD-10-CM

## 2023-08-13 DIAGNOSIS — Z7982 Long term (current) use of aspirin: Secondary | ICD-10-CM | POA: Insufficient documentation

## 2023-08-13 DIAGNOSIS — E861 Hypovolemia: Secondary | ICD-10-CM | POA: Diagnosis not present

## 2023-08-13 DIAGNOSIS — I130 Hypertensive heart and chronic kidney disease with heart failure and stage 1 through stage 4 chronic kidney disease, or unspecified chronic kidney disease: Secondary | ICD-10-CM | POA: Diagnosis not present

## 2023-08-13 DIAGNOSIS — R42 Dizziness and giddiness: Principal | ICD-10-CM

## 2023-08-13 DIAGNOSIS — Z79899 Other long term (current) drug therapy: Secondary | ICD-10-CM | POA: Diagnosis not present

## 2023-08-13 DIAGNOSIS — I5032 Chronic diastolic (congestive) heart failure: Secondary | ICD-10-CM | POA: Diagnosis not present

## 2023-08-13 DIAGNOSIS — E162 Hypoglycemia, unspecified: Secondary | ICD-10-CM

## 2023-08-13 LAB — COMPREHENSIVE METABOLIC PANEL
ALT: 17 U/L (ref 0–44)
AST: 19 U/L (ref 15–41)
Albumin: 4.1 g/dL (ref 3.5–5.0)
Alkaline Phosphatase: 50 U/L (ref 38–126)
Anion gap: 7 (ref 5–15)
BUN: 20 mg/dL (ref 8–23)
CO2: 22 mmol/L (ref 22–32)
Calcium: 8.8 mg/dL — ABNORMAL LOW (ref 8.9–10.3)
Chloride: 108 mmol/L (ref 98–111)
Creatinine, Ser: 1.49 mg/dL — ABNORMAL HIGH (ref 0.44–1.00)
GFR, Estimated: 37 mL/min — ABNORMAL LOW (ref >60)
Glucose, Bld: 92 mg/dL (ref 70–99)
Potassium: 4 mmol/L (ref 3.5–5.1)
Sodium: 137 mmol/L (ref 135–145)
Total Bilirubin: 1.2 mg/dL (ref 0.3–1.2)
Total Protein: 6.5 g/dL (ref 6.5–8.1)

## 2023-08-13 LAB — URINALYSIS, ROUTINE W REFLEX MICROSCOPIC
Bacteria, UA: NONE SEEN
Bilirubin Urine: NEGATIVE
Glucose, UA: NEGATIVE mg/dL
Hgb urine dipstick: NEGATIVE
Ketones, ur: NEGATIVE mg/dL
Nitrite: NEGATIVE
Protein, ur: NEGATIVE mg/dL
Specific Gravity, Urine: <1.005 — ABNORMAL LOW (ref 1.005–1.030)
pH: 5.5 (ref 5.0–8.0)

## 2023-08-13 LAB — CBC WITH DIFFERENTIAL/PLATELET
Abs Immature Granulocytes: 0.02 10*3/uL (ref 0.00–0.07)
Basophils Absolute: 0 10*3/uL (ref 0.0–0.1)
Basophils Relative: 1 %
Eosinophils Absolute: 0.1 10*3/uL (ref 0.0–0.5)
Eosinophils Relative: 3 %
HCT: 33.7 % — ABNORMAL LOW (ref 36.0–46.0)
Hemoglobin: 10.8 g/dL — ABNORMAL LOW (ref 12.0–15.0)
Immature Granulocytes: 0 %
Lymphocytes Relative: 29 %
Lymphs Abs: 1.5 10*3/uL (ref 0.7–4.0)
MCH: 30.9 pg (ref 26.0–34.0)
MCHC: 32 g/dL (ref 30.0–36.0)
MCV: 96.3 fL (ref 80.0–100.0)
Monocytes Absolute: 0.4 10*3/uL (ref 0.1–1.0)
Monocytes Relative: 8 %
Neutro Abs: 3.1 10*3/uL (ref 1.7–7.7)
Neutrophils Relative %: 59 %
Platelets: 199 10*3/uL (ref 150–400)
RBC: 3.5 MIL/uL — ABNORMAL LOW (ref 3.87–5.11)
RDW: 13.3 % (ref 11.5–15.5)
WBC: 5.2 10*3/uL (ref 4.0–10.5)
nRBC: 0 % (ref 0.0–0.2)

## 2023-08-13 LAB — TROPONIN I (HIGH SENSITIVITY)
Troponin I (High Sensitivity): 15 ng/L (ref <18)
Troponin I (High Sensitivity): 14 ng/L (ref <18)

## 2023-08-13 LAB — CBG MONITORING, ED
Glucose-Capillary: 58 mg/dL — ABNORMAL LOW (ref 70–99)
Glucose-Capillary: 68 mg/dL — ABNORMAL LOW (ref 70–99)
Glucose-Capillary: 104 mg/dL — ABNORMAL HIGH (ref 70–99)
Glucose-Capillary: 69 mg/dL — ABNORMAL LOW (ref 70–99)
Glucose-Capillary: 91 mg/dL (ref 70–99)
Glucose-Capillary: 89 mg/dL (ref 70–99)
Glucose-Capillary: 71 mg/dL (ref 70–99)
Glucose-Capillary: 80 mg/dL (ref 70–99)
Glucose-Capillary: 74 mg/dL (ref 70–99)

## 2023-08-13 LAB — BRAIN NATRIURETIC PEPTIDE: B Natriuretic Peptide: 224.2 pg/mL — ABNORMAL HIGH (ref 0.0–100.0)

## 2023-08-13 LAB — GLUCOSE, CAPILLARY: Glucose-Capillary: 82 mg/dL (ref 70–99)

## 2023-08-13 LAB — LIPASE, BLOOD: Lipase: 22 U/L (ref 11–51)

## 2023-08-13 MED ORDER — ENOXAPARIN SODIUM 40 MG/0.4ML IJ SOSY
40.0000 mg | PREFILLED_SYRINGE | Freq: Every day | INTRAMUSCULAR | Status: DC
  Administered 2023-08-13 – 2023-08-14 (×2): 40 mg via SUBCUTANEOUS
  Filled 2023-08-13 (×2): qty 0.4

## 2023-08-13 MED ORDER — LACTATED RINGERS IV SOLN: INTRAVENOUS | Status: AC

## 2023-08-13 NOTE — ED Notes (Signed)
No complaints of dizziness during orthostatic vitals.

## 2023-08-13 NOTE — ED Notes (Signed)
PT given 236 mL of apple juice and a nutrigrain bar.

## 2023-08-13 NOTE — Assessment & Plan Note (Signed)
-  pt reports hypotension down to SBP of 80s for several days likely hypovolemic from chronic diarrhea from IBS -BP has been normal since being in ED -will continue to hold antihypertensive and Lasix while she receives more fluid overnight

## 2023-08-13 NOTE — Assessment & Plan Note (Signed)
-   No recent A1c but is diet controlled.  Patient reports cutting out flour and sugar from her diet -On presentation her CBG was around 58.  This improved with oral intake.  Suspect secondary again to her chronic diarrhea and dehydration -Check CBG q4 x2

## 2023-08-13 NOTE — ED Notes (Signed)
Pt CBG 58, EDP notified. Verbal order to give juice and crackers. Pt provided juice and crackers at this time.

## 2023-08-13 NOTE — Progress Notes (Signed)
72 year old female went to her PCP yesterday to get B12 shot was found to be hypotensive she was asked to stop taking furosemide she continued to feel lightheaded and acute on chronic diarrhea she came to the ER today for the same reason.  She is also hypoglycemic in the emergency room as her p.o. intake is poor.  She is being admitted with AKI, hypotension, hypovolemia and diarrhea.  She is accepted to telemetry observation.

## 2023-08-13 NOTE — Assessment & Plan Note (Addendum)
-  Hgb is down to 10.8 from prior of 12.4. No melena or bleeding.  -she receives B12 injections outpatient -Questions accuracy of this.  Will follow repeat Hgb in the morning

## 2023-08-13 NOTE — ED Triage Notes (Signed)
Pt reports light-headed "like [she's] gonna pass out" since Monday, along with intermittent low BP.  Pt seen by PMD, stopped taking Rx of furosemide, and advised to report to ED if symptoms continued. Pt h/o chronic diarrhea.

## 2023-08-13 NOTE — ED Provider Notes (Signed)
[name] [facility] DE[facility]MENT AT [facility] Provider Note   CSN: [id number] Arrival date & time: 08/13/23  1124     History Chief Complaint  Patient presents with   Dizziness    Wendy Downs is a 72 y.o. female with h/o CHF, IBS with diarrhea, HTN, and HLD presents to the ER today for evaluation of lightheadedness and fatigue for the past 5 days.  Patient reports that she has chronic diarrhea however she reports she feels it has been worsening for the past 2 weeks.  She reports that she has around 5 episodes per day but can be more.  She denies any melena or hematochezia.  Denies any belly pain.  She reports that she was seen the other day recently at a doctor's office for B12 injection it was told her blood pressure was 91/67.  She was told to stop taking the furosemide and also stop taking her blood pressure medication.  She reports that she took her blood pressure last night and her blood pressure was 82/53.  She reports that she took her blood pressure this morning and it was 119/77 but her heart rate was 46.  She has not had any of her blood pressure medications or furosemide in the past few days.  She reports that she feels that she may be having more diarrhea this week but still denies any abdominal pain.  She denies any nausea, vomiting, fevers, dysuria, hematuria.  She denies any dizziness, headache, or any vision changes.  Denies any syncope.  She reports that the lightheadedness does not change with changing position and sometimes feels it at random.  She was wondering if her sugar was getting low however she lost her glucometer and cannot check.  She reports that she tries to stay hydrated however even water causes worsening to her diarrhea.   Dizziness Associated symptoms: diarrhea   Associated symptoms: no blood in stool, no chest pain, no nausea, no shortness of breath and no vomiting        Home Medications Prior to Admission medications   Medication Sig  Start Date End Date Taking? Authorizing Provider  albuterol (PROVENTIL HFA;VENTOLIN HFA) 108 (90 Base) MCG/ACT inhaler TAKE 2 PUFFS BY MOUTH EVERY 4 HOURS AS NEEDED 01/27/18   Nyoka Cowden, MD  allopurinol (ZYLOPRIM) 100 MG tablet Take 200 mg by mouth daily.     [provider]  aspirin EC 81 MG tablet Take 81 mg by mouth daily. 09/24/19   [provider]  cetirizine (ZYRTEC) 10 MG tablet Take 10 mg by mouth daily as needed for allergies.    [provider]  cyanocobalamin (,VITAMIN B-12,) 1000 MCG/ML injection Inject 1,000 mcg into the muscle every 30 (thirty) days.    [provider]  dorzolamide-timolol (COSOPT) 2-0.5 % ophthalmic solution Place 1 drop into the right eye 2 (two) times daily.    [provider]  furosemide (LASIX) 20 MG tablet Take 20 mg by mouth daily.    [provider]  latanoprost (XALATAN) 0.005 % ophthalmic solution Place 1 drop into both eyes at bedtime.    [provider]  Multiple Vitamins-Minerals (MULTIVITAMIN ADULTS PO) Take by mouth. Bariatric multivitamin    [provider]  Nebivolol HCl 20 MG TABS Take 20 mg by mouth daily.    [provider]  pantoprazole (PROTONIX) 40 MG tablet Take 80 mg by mouth daily.    [provider]  potassium chloride (KLOR-CON) 10 MEQ tablet Take 1  tablet (10 mEq total) by mouth daily. 11/19/22 11/20/23  Swinyer, Zachary George, NP  prednisoLONE acetate (PRED FORTE) 1 % ophthalmic suspension Place 1 drop into the left eye in the morning and at bedtime. 09/12/18   [provider]  rosuvastatin (CRESTOR) 5 MG tablet Take 1 tablet (5 mg total) by mouth every evening. 12/24/22 12/24/23  Swinyer, Zachary George, NP  telmisartan (MICARDIS) 80 MG tablet Take 80 mg by mouth daily.    [provider]  TURMERIC CURCUMIN PO Take 900 mg by mouth daily.    [provider]  VITAMIN D PO Take by mouth.    [provider]      Allergies     Ezetimibe, Verapamil, Clonidine derivatives, Kiwi extract, Lactose intolerance (gi), Other, Prednisone, Valtrex [valacyclovir hcl], Vaseretic [enalapril-hydrochlorothiazide], Cholestyramine, and Codeine    Review of Systems   Review of Systems  Constitutional:  Negative for chills and fever.  Respiratory:  Negative for shortness of breath.   Cardiovascular:  Negative for chest pain.  Gastrointestinal:  Positive for diarrhea. Negative for abdominal pain, blood in stool, constipation, nausea and vomiting.  Genitourinary:  Negative for dysuria and hematuria.  Neurological:  Positive for dizziness and light-headedness. Negative for syncope.    Physical Exam Updated Vital Signs BP (!) 149/52 (BP Location: Right Arm)   Pulse 75   Temp 97.8 F (36.6 C) (Oral)   Resp 17   SpO2 100%  Physical Exam Vitals and nursing note reviewed.  Constitutional:      General: She is not in acute distress.    Appearance: She is not toxic-appearing.  HENT:     Mouth/Throat:     Mouth: Mucous membranes are moist.  Eyes:     General: No scleral icterus.    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  Cardiovascular:     Rate and Rhythm: Normal rate.     Comments: Frequent PVCs palpated Pulmonary:     Effort: Pulmonary effort is normal. No respiratory distress.  Abdominal:     Palpations: Abdomen is soft.     Tenderness: There is no abdominal tenderness. There is no guarding or rebound.  Musculoskeletal:     Cervical back: Normal range of motion.     Right lower leg: No edema.     Left lower leg: No edema.  Skin:    General: Skin is warm and dry.  Neurological:     General: No focal deficit present.     Mental Status: She is alert. Mental status is at baseline.     Motor: No weakness.     Gait: Gait normal.     Comments: Unable to truly assess some the cranial nerves due to her cloudy cornea which is at her baseline otherwise intact.  Stable gait.  Conversational  with appropriate speech.     ED Results / Procedures / Treatments   Labs (all labs ordered are listed, but only abnormal results are displayed) Labs Reviewed  CBG MONITORING, ED - Abnormal; Notable for the following components:      Result Value   Glucose-Capillary 58 (*)    All other components within normal limits  CBC WITH DIFFERENTIAL/PLATELET  COMPREHENSIVE METABOLIC PANEL  LIPASE, BLOOD  BRAIN NATRIURETIC PEPTIDE  URINALYSIS, ROUTINE W REFLEX MICROSCOPIC  TROPONIN I (HIGH SENSITIVITY)    EKG None  Radiology No results found.  Procedures Procedures   Medications Ordered in ED Medications - No data to display  ED  Course/ Medical Decision Making/ A&P Clinical Course as of 08/13/23 2216  Fri Aug 13, 2023  1148 CBG 58, patient is being given crackers and juice now [RR]  1251 Patient had peanut butter crackers and juice and only went up to 68. Encouraged more PO and will check in 30 minutes.  [RR]    Clinical Course User Index [RR] Achille Rich, PA-C   Medical Decision Making Amount and/or Complexity of Data Reviewed Labs: ordered.  Risk Decision regarding hospitalization.   72 y.o. female presents to the ER for evaluation of lightheadedness. Differential diagnosis includes but is not limited to CVA, spinal cord injury, ACS, arrhythmia, syncope, orthostatic hypotension, sepsis, hypoglycemia, hypoxia, electrolyte disturbance, endocrine disorder, anemia, environmental exposure, polypharmacy. Vital signs blood pressure 123/49 otherwise unremarkable. Physical exam as noted above.   On presentation, patient had a low blood sugar of 58.  She was given crackers and juice and went up to 68.  She was encouraged to eat and drink more including more crackers and water juice and even had a Nutrigrain bar as well.  Patient glucose only improved to 69.  Will give this more time.  She does not want to eat and drink anymore because it caused her to go to the bathroom multiple  times.  I independently reviewed and interpreted the patient's labs.  CBC shows anemia with a hemoglobin of 10.8 which is a new finding.  CMP shows creatinine at 1.49 slightly elevated from previous however not by much.  Mildly decreased calcium at 8.8 otherwise no other electrolyte or LFT abnormality.  Urinalysis shows dilute colorless urine with trace amount leukocytes.  Lipase within normal limits.  Troponin at 14.  BNP slightly elevated at 224.2.  Her blood pressures seem to be improving some or even systolically in the 140s however the more that she was given to drink or eat though, the more she meant to have diarrheal episodes and her blood pressure continued to decrease as well as her glucose did as well.  I am concerned the patient may be having some dumping syndrome or not be able to get any nutrients or fuel from her food given that they are exiting her body so quickly.  I am also concerned about her maintaining her blood pressure because of the loss of fluids.  Concern about giving her any IV fluids given her history of congestive heart failure and fluid retention.  I am concerned with her being hypotensive, hypovolemic, and hypoglycemic at home and possibly loses consciousness.  I do not think that she is having any TIA or stroke.  She does not have any focal neurodeficit.  She has a steady gait.  It seems to be the hypotension and hypoglycemia that are causing some of the symptoms.  I do think an admission stay is reasonable to see if we cannot stabilize some of her symptoms and improve condition.  Discussed my attending who agrees.  Patient amenable to admission.  Admit to [facility].  Portions of this report may have been transcribed using voice recognition software. Every effort was made to ensure accuracy; however, inadvertent computerized transcription errors may be present.   I discussed this case with my attending physician who cosigned this note including patient's presenting  symptoms, physical exam, and planned diagnostics and interventions. Attending physician stated agreement with plan or made changes to plan which were implemented.   Final Clinical Impression(s) / ED Diagnoses Final diagnoses:  Lightheadedness  Hypoglycemia  Diarrhea, unspecified type  Rx / DC Orders ED Discharge Orders     None         Achille Rich, Cordelia Poche 08/13/23 2227    Anders Simmonds T, DO 08/14/23 1235

## 2023-08-13 NOTE — Assessment & Plan Note (Signed)
-  BMP notable for elevated creatinine of 1.49 with a prior of 1.32. -secondary to hypovolemia -keep on continuous IV fluid overnight

## 2023-08-13 NOTE — Assessment & Plan Note (Signed)
noted 

## 2023-08-13 NOTE — H&P (Signed)
History and Physical    Patient: Wendy Downs ZOX:[id number] DOB: 06-27-1951 DOA: 08/13/2023 DOS: the patient was seen and examined on 08/13/2023 PCP: Merri Brunette, MD  Patient coming from:  outside ED  Chief Complaint:  Chief Complaint  Patient presents with   Dizziness   HPI: Wendy Downs is a 72 y.o. female with medical history significant of CAD, chronic diastolic heart failure, IBS with predominant diarrheal symptoms, CKD 3 A, type 2 diabetes who presents with hypotension.  Patient was seen outpatient 2 days ago for B12 injection and was noted to have hypotension with systolic in the 80s.  She was then told to hold her Lasix.  Then later when she was at home she continued to note systolic blood pressure in the 80s and her heart rate being in the 40s. Has just been feeling weak. She discussed this with her primary and was told to be evaluated in the ED.  Patient has been holding her Nebivolol since she has seen her low BP. Pt says she has chronic diarrhea from her IBS. She is not able to give me an average but had up to 6-7 episodes today alone. Thinks perhaps she has not been staying hydrated. She has been working with a nutritionist to help with her IBS.  States that she could be triggered by almost anything, sometimes even just drinking water. Reports intermittent abdominal crampy pain which is not worse this past week.No fever.   On arrival to the ED, she was afebrile, BP of 149/52, heart rate in the high 60s to 70s.  She was noted to be hypoglycemic with CBG of 58 which improved with oral intake.  CBC without leukocytosis, hemoglobin with new anemia of 10.8 from prior of 12.4 about 8 months ago.  BMP notable for elevated creatinine of 1.49 with a prior of 1.32.  UA was negative. Review of Systems: As mentioned in the history of present illness. All other systems reviewed and are negative. Past Medical History:  Diagnosis Date   Diabetes mellitus without complication  (HCC)    Gout    Hyperlipidemia    Hypertension    IBS (irritable bowel syndrome)    Morbid obesity (HCC)    Shingles    Past Surgical History:  Procedure Laterality Date   ABDOMINAL HYSTERECTOMY     APPENDECTOMY     CARPAL TUNNEL RELEASE     CHOLECYSTECTOMY     Social History:  reports that she has never smoked. She has never used smokeless tobacco. She reports that she does not drink alcohol and does not use drugs.  Allergies  Allergen Reactions   Ezetimibe Other (See Comments)    Migraine aura's   Verapamil Other (See Comments)    Swelling in legs   Clonidine Derivatives Other (See Comments)    Ear Pain, Dizziness, Insomnia, migraine, constipation. Patient stated that medicine kept her off balance.   Kiwi Extract    Lactose Intolerance (Gi)     Suspected. Intolerance to milk, yogurt and ice cream.    Other     Sugar, artificial sweeteners    Prednisone Other (See Comments)    Mood swings   Valtrex [Valacyclovir Hcl] Other (See Comments)    Maybe stomach pains   Vaseretic [Enalapril-Hydrochlorothiazide] Other (See Comments)    Gout    Cholestyramine Anxiety, Other (See Comments) and Swelling   Codeine Other (See Comments)    Pain in stomach    Family History  Problem Relation Age of Onset  Heart attack Mother    Hypertension Mother    Stroke Father    Heart disease Sister    Stroke Sister    Heart disease Brother    Stroke Brother    Stroke Sister    Aneurysm Brother     Prior to Admission medications   Medication Sig Start Date End Date Taking? Authorizing Provider  triamcinolone cream (KENALOG) 0.5 % Apply 1 Application topically 2 (two) times daily. 08/03/23  Yes [provider]  albuterol (PROVENTIL HFA;VENTOLIN HFA) 108 (90 Base) MCG/ACT inhaler TAKE 2 PUFFS BY MOUTH EVERY 4 HOURS AS NEEDED 01/27/18   Nyoka Cowden, MD  allopurinol (ZYLOPRIM) 100 MG tablet Take 200 mg by mouth daily.     [provider]  aspirin EC 81 MG tablet  Take 81 mg by mouth daily. 09/24/19   [provider]  cetirizine (ZYRTEC) 10 MG tablet Take 10 mg by mouth daily as needed for allergies.    [provider]  cyanocobalamin (,VITAMIN B-12,) 1000 MCG/ML injection Inject 1,000 mcg into the muscle every 30 (thirty) days.    [provider]  dorzolamide-timolol (COSOPT) 2-0.5 % ophthalmic solution Place 1 drop into the right eye 2 (two) times daily.    [provider]  furosemide (LASIX) 20 MG tablet Take 20 mg by mouth daily.    [provider]  latanoprost (XALATAN) 0.005 % ophthalmic solution Place 1 drop into both eyes at bedtime.    [provider]  Multiple Vitamins-Minerals (MULTIVITAMIN ADULTS PO) Take by mouth. Bariatric multivitamin    [provider]  Nebivolol HCl 20 MG TABS Take 20 mg by mouth daily.    [provider]  pantoprazole (PROTONIX) 40 MG tablet Take 80 mg by mouth daily.    [provider]  potassium chloride (KLOR-CON) 10 MEQ tablet Take 1 tablet (10 mEq total) by mouth daily. 11/19/22 11/20/23  Swinyer, Zachary George, NP  prednisoLONE acetate (PRED FORTE) 1 % ophthalmic suspension Place 1 drop into the left eye in the morning and at bedtime. 09/12/18   [provider]  rosuvastatin (CRESTOR) 5 MG tablet Take 1 tablet (5 mg total) by mouth every evening. 12/24/22 12/24/23  Swinyer, Zachary George, NP  telmisartan (MICARDIS) 80 MG tablet Take 80 mg by mouth daily.    [provider]  TURMERIC CURCUMIN PO Take 900 mg by mouth daily.    [provider]  VITAMIN D PO Take by mouth.    [provider]    Physical Exam: Vitals:   08/13/23 1730 08/13/23 1800 08/13/23 2000 08/13/23 2112  BP: 127/63 126/65  (!) 146/68  Pulse: (!) 107 71  63  Resp: 17 16  18   Temp:   98 F (36.7 C) 98.1 F (36.7 C)  TempSrc:    Oral  SpO2: 99% 97%  100%  Height:    5\' 4"  (1.626 m)   Constitutional: NAD, calm, comfortable, well-appearing  elderly female appearing stated age lying upright in bed Eyes: lids and conjunctivae normal ENMT: Mucous membranes are moist. Neck: normal, supple Respiratory: clear to auscultation bilaterally, no wheezing, no crackles. Normal respiratory effort. No accessory muscle use.  Cardiovascular: Regular rate and rhythm, no murmurs / rubs / gallops. No extremity edema. .  Abdomen: Soft, nontender nondistended. Musculoskeletal: no clubbing / cyanosis. No joint deformity upper and lower extremities.  Normal muscle tone.  Skin: no rashes, lesions, ulcers. No induration Neurologic: CN 2-12 grossly intact.  Psychiatric: Normal judgment  and insight. Alert and oriented x 3. Normal mood.   Data Reviewed:  See HPI  Assessment and Plan: * Hypotension -pt reports hypotension down to SBP of 80s for several days likely hypovolemic from chronic diarrhea from IBS -BP has been normal since being in ED -will continue to hold antihypertensive and Lasix while she receives more fluid overnight  Diabetes mellitus with hypoglycemia (HCC) - No recent A1c but is diet controlled.  Patient reports cutting out flour and sugar from her diet -On presentation her CBG was around 58.  This improved with oral intake.  Suspect secondary again to her chronic diarrhea and dehydration -Check CBG q4 x2  Normocytic anemia -Hgb is down to 10.8 from prior of 12.4. No melena or bleeding.  -she receives B12 injections outpatient -Questions accuracy of this.  Will follow repeat Hgb in the morning  Acute kidney injury superimposed on stage 3a chronic kidney disease (HCC) -BMP notable for elevated creatinine of 1.49 with a prior of 1.32. -secondary to hypovolemia -keep on continuous IV fluid overnight  Morbid obesity due to excess calories (HCC) noted      Advance Care Planning:   Code Status: Full Code   Consults: None  Family Communication: None at bedside  Severity of Illness: The appropriate patient status for this  patient is OBSERVATION. Observation status is judged to be reasonable and necessary in order to provide the required intensity of service to ensure the patient's safety. The patient's presenting symptoms, physical exam findings, and initial radiographic and laboratory data in the context of their medical condition is felt to place them at decreased risk for further clinical deterioration. Furthermore, it is anticipated that the patient will be medically stable for discharge from the hospital within 2 midnights of admission.   Author: Anselm Jungling, DO 08/13/2023 10:51 PM  For on call review [email or web address].

## 2023-08-13 NOTE — ED Notes (Signed)
CBG 58, Pt given 118 mL of OJ and peanut butter crackers.

## 2023-08-13 NOTE — ED Notes (Signed)
Triage delay, pt in restroom 

## 2023-08-13 NOTE — ED Notes (Signed)
Pt CBG 68, pt provided additional juice and crackers.

## 2023-08-14 ENCOUNTER — Encounter (HOSPITAL_COMMUNITY): Payer: Self-pay | Admitting: Internal Medicine

## 2023-08-14 DIAGNOSIS — N179 Acute kidney failure, unspecified: Secondary | ICD-10-CM | POA: Diagnosis not present

## 2023-08-14 DIAGNOSIS — N1831 Chronic kidney disease, stage 3a: Secondary | ICD-10-CM | POA: Diagnosis not present

## 2023-08-14 DIAGNOSIS — D649 Anemia, unspecified: Secondary | ICD-10-CM | POA: Diagnosis not present

## 2023-08-14 DIAGNOSIS — E861 Hypovolemia: Secondary | ICD-10-CM | POA: Diagnosis not present

## 2023-08-14 DIAGNOSIS — E11649 Type 2 diabetes mellitus with hypoglycemia without coma: Secondary | ICD-10-CM | POA: Diagnosis not present

## 2023-08-14 LAB — BASIC METABOLIC PANEL
Anion gap: 7 (ref 5–15)
BUN: 17 mg/dL (ref 8–23)
CO2: 21 mmol/L — ABNORMAL LOW (ref 22–32)
Calcium: 8.2 mg/dL — ABNORMAL LOW (ref 8.9–10.3)
Chloride: 111 mmol/L (ref 98–111)
Creatinine, Ser: 1.22 mg/dL — ABNORMAL HIGH (ref 0.44–1.00)
GFR, Estimated: 47 mL/min — ABNORMAL LOW (ref >60)
Glucose, Bld: 88 mg/dL (ref 70–99)
Potassium: 3.8 mmol/L (ref 3.5–5.1)
Sodium: 139 mmol/L (ref 135–145)

## 2023-08-14 LAB — GLUCOSE, CAPILLARY
Glucose-Capillary: 85 mg/dL (ref 70–99)
Glucose-Capillary: 83 mg/dL (ref 70–99)
Glucose-Capillary: 92 mg/dL (ref 70–99)
Glucose-Capillary: 83 mg/dL (ref 70–99)
Glucose-Capillary: 96 mg/dL (ref 70–99)

## 2023-08-14 LAB — HEMOGLOBIN A1C
Hgb A1c MFr Bld: 5.5 % (ref 4.8–5.6)
Mean Plasma Glucose: 111.15 mg/dL

## 2023-08-14 LAB — CBC
HCT: 30.2 % — ABNORMAL LOW (ref 36.0–46.0)
Hemoglobin: 9.3 g/dL — ABNORMAL LOW (ref 12.0–15.0)
MCH: 31 pg (ref 26.0–34.0)
MCHC: 30.8 g/dL (ref 30.0–36.0)
MCV: 100.7 fL — ABNORMAL HIGH (ref 80.0–100.0)
Platelets: 158 10*3/uL (ref 150–400)
RBC: 3 MIL/uL — ABNORMAL LOW (ref 3.87–5.11)
RDW: 13.3 % (ref 11.5–15.5)
WBC: 4.3 10*3/uL (ref 4.0–10.5)
nRBC: 0 % (ref 0.0–0.2)

## 2023-08-14 LAB — IRON AND TIBC
Iron: 59 ug/dL (ref 28–170)
Saturation Ratios: 14 % (ref 10.4–31.8)
TIBC: 419 ug/dL (ref 250–450)
UIBC: 360 ug/dL

## 2023-08-14 LAB — RETICULOCYTES
Immature Retic Fract: 12.3 % (ref 2.3–15.9)
RBC.: 3.52 MIL/uL — ABNORMAL LOW (ref 3.87–5.11)
Retic Count, Absolute: 54.2 10*3/uL (ref 19.0–186.0)
Retic Ct Pct: 1.5 % (ref 0.4–3.1)

## 2023-08-14 LAB — FOLATE: Folate: 8.1 ng/mL (ref >5.9)

## 2023-08-14 LAB — FERRITIN: Ferritin: 156 ng/mL (ref 11–307)

## 2023-08-14 LAB — VITAMIN B12: Vitamin B-12: 1400 pg/mL — ABNORMAL HIGH (ref 180–914)

## 2023-08-14 MED ORDER — POTASSIUM CHLORIDE CRYS ER 10 MEQ PO TBCR
10.0000 meq | EXTENDED_RELEASE_TABLET | Freq: Every day | ORAL | Status: DC
  Administered 2023-08-14 – 2023-08-15 (×2): 10 meq via ORAL
  Filled 2023-08-14 (×2): qty 1

## 2023-08-14 MED ORDER — PANTOPRAZOLE SODIUM 40 MG PO TBEC: 80.0000 mg | DELAYED_RELEASE_TABLET | Freq: Every day | ORAL | Status: DC

## 2023-08-14 MED ORDER — LACTATED RINGERS IV SOLN: INTRAVENOUS | Status: DC

## 2023-08-14 MED ORDER — PANTOPRAZOLE SODIUM 40 MG PO TBEC
80.0000 mg | DELAYED_RELEASE_TABLET | Freq: Every day | ORAL | Status: DC
  Administered 2023-08-14 – 2023-08-15 (×2): 80 mg via ORAL
  Filled 2023-08-14 (×3): qty 2

## 2023-08-14 MED ORDER — ALBUTEROL SULFATE HFA 108 (90 BASE) MCG/ACT IN AERS: 1.0000 | INHALATION_SPRAY | RESPIRATORY_TRACT | Status: DC | PRN

## 2023-08-14 MED ORDER — ROSUVASTATIN CALCIUM 5 MG PO TABS
5.0000 mg | ORAL_TABLET | Freq: Every evening | ORAL | Status: DC
  Administered 2023-08-14: 5 mg via ORAL
  Filled 2023-08-14: qty 1

## 2023-08-14 MED ORDER — ALBUTEROL SULFATE (2.5 MG/3ML) 0.083% IN NEBU: 2.5000 mg | INHALATION_SOLUTION | Freq: Four times a day (QID) | RESPIRATORY_TRACT | Status: DC | PRN

## 2023-08-14 MED ORDER — POTASSIUM CHLORIDE ER 10 MEQ PO TBCR: 10.0000 meq | EXTENDED_RELEASE_TABLET | Freq: Every day | ORAL | Status: DC

## 2023-08-14 NOTE — Progress Notes (Signed)
[facility]                                                                               Wendy Downs, is a 72 y.o. female, DOB - Nov 09, 1950, QMV:[id number] Admit date - 08/13/2023    Outpatient Primary MD for the patient is Merri Brunette, MD  LOS - 0  days    Brief summary    72 y.o. female with medical history significant of CAD, chronic diastolic heart failure, IBS with predominant diarrheal symptoms, CKD 3 A, type 2 diabetes who presents with hypotension.  She also reports intermittent diarrhea from IBS, and has been having a lot of nausea .  She was also found to be in mild AKI.   Assessment & Plan    Assessment and Plan: * Hypotension -pt reports hypotension down to SBP of 80s for several days likely hypovolemic from chronic diarrhea from IBS Holding all anti hypertensive medications at this time.  - bp has normalized.  - monitor overnight and if BP remain stable, will discharge her home in am.   Diabetes mellitus with hypoglycemia (HCC) - No recent A1c but is diet controlled.  Patient reports cutting out flour and sugar from her diet CBG (last 3)  Recent Labs    08/14/23 0421 08/14/23 0816 08/14/23 1213  GLUCAP 85 83 92   Resume SSI.   Normocytic anemia -Hgb is down to 10.8 from prior of 12.4. No melena or bleeding.  -she receives B12 injections outpatient - hemoglobin continued to drop to 9.3 today. Will check anemia panel.   Acute kidney injury superimposed on stage 3a chronic kidney disease (HCC) -BMP notable for elevated creatinine of 1.49 with a prior of 1.32. -secondary to hypovolemia - with IV fluids creatinine improved to 1.2.   Morbid obesity due to excess calories (HCC) Body mass index is 37.54 kg/m.           Estimated body mass index is 37.54 kg/m as calculated from the following:   Height as of this encounter: 5\' 4"  (1.626 m).   Weight as of this encounter: 99.2 kg.  Code Status: full code.  DVT Prophylaxis:   enoxaparin (LOVENOX) injection 40 mg Start: 08/13/23 2200   Level of Care: Level of care: Telemetry Family Communication: none at bedside.   Disposition Plan:     Remains inpatient appropriate:  discharge in am.   Procedures:  None.   Consultants:   None.   Antimicrobials:   Anti-infectives (From admission, onward)    None        Medications  Scheduled Meds:  enoxaparin (LOVENOX) injection  40 mg Subcutaneous QHS   pantoprazole  80 mg Oral Q breakfast   potassium chloride  10 mEq Oral Daily   rosuvastatin  5 mg Oral QPM   Continuous Infusions: PRN Meds:.albuterol    Subjective:   Evette Diclemente was seen and examined today. Diarrhea, and nausea improved.   Objective:   Vitals:   08/13/23 2112 08/14/23 0049 08/14/23 0424 08/14/23 1410  BP: (!) 146/68 (!) 119/58 130/74 (!) 142/74  Pulse: 63 64 63 70  Resp: 18 18 18 18   Temp: 98.1 F (36.7 C)  97.9 F (36.6 C) 98.3 F (36.8 C) 98.5 F (36.9 C)  TempSrc: Oral Oral Oral Oral  SpO2: 100% 100% 100% 100%  Weight: 99.2 kg     Height: 5\' 4"  (1.626 m)       Intake/Output Summary (Last 24 hours) at 08/14/2023 1542 Last data filed at 08/14/2023 0600 Gross per 24 hour  Intake 543.91 ml  Output --  Net 543.91 ml   Filed Weights   08/13/23 2112  Weight: 99.2 kg     Exam General exam: Appears calm and comfortable  Respiratory system: Clear to auscultation. Respiratory effort normal. Cardiovascular system: S1 & S2 heard, RRR.  Gastrointestinal system: Abdomen is nondistended, soft and nontender.  Central nervous system: Alert and oriented. No focal neurological deficits. Extremities: Symmetric 5 x 5 power. Skin: No rashes, lesions or ulcers Psychiatry: mood is appropriate.    Data Reviewed:  I have personally reviewed following labs and imaging studies   CBC Lab Results  Component Value Date   WBC 4.3 08/14/2023   RBC 3.00 (L) 08/14/2023   HGB 9.3 (L) 08/14/2023   HCT 30.2 (L) 08/14/2023   MCV  100.7 (H) 08/14/2023   MCH 31.0 08/14/2023   PLT 158 08/14/2023   MCHC 30.8 08/14/2023   RDW 13.3 08/14/2023   LYMPHSABS 1.5 08/13/2023   MONOABS 0.4 08/13/2023   EOSABS 0.1 08/13/2023   BASOSABS 0.0 08/13/2023     Last metabolic panel Lab Results  Component Value Date   NA 139 08/14/2023   K 3.8 08/14/2023   CL 111 08/14/2023   CO2 21 (L) 08/14/2023   BUN 17 08/14/2023   CREATININE 1.22 (H) 08/14/2023   GLUCOSE 88 08/14/2023   GFRNONAA 47 (L) 08/14/2023   GFRAA 51 (L) 09/22/2019   CALCIUM 8.2 (L) 08/14/2023   PROT 6.5 08/13/2023   ALBUMIN 4.1 08/13/2023   BILITOT 1.2 08/13/2023   ALKPHOS 50 08/13/2023   AST 19 08/13/2023   ALT 17 08/13/2023   ANIONGAP 7 08/14/2023    CBG (last 3)  Recent Labs    08/14/23 0421 08/14/23 0816 08/14/23 1213  GLUCAP 85 83 92      Coagulation Profile: No results for input(s): "INR", "PROTIME" in the last 168 hours.   Radiology Studies: No results found.     Kathlen Mody M.D. [facility] 08/14/2023, 3:42 PM  Available via Epic secure chat 7am-7pm After 7 pm, please refer to night coverage provider listed on amion.

## 2023-08-14 NOTE — Plan of Care (Signed)
  Problem: Education: Goal: Knowledge of General Education information will improve Description: Including pain rating scale, medication(s)/side effects and non-pharmacologic comfort measures Outcome: Progressing   Problem: Clinical Measurements: Goal: Diagnostic test results will improve Outcome: Progressing Goal: Cardiovascular complication will be avoided Outcome: Progressing   Problem: Coping: Goal: Level of anxiety will decrease Outcome: Progressing

## 2023-08-15 DIAGNOSIS — E11649 Type 2 diabetes mellitus with hypoglycemia without coma: Secondary | ICD-10-CM | POA: Diagnosis not present

## 2023-08-15 DIAGNOSIS — E861 Hypovolemia: Secondary | ICD-10-CM | POA: Diagnosis not present

## 2023-08-15 DIAGNOSIS — N1831 Chronic kidney disease, stage 3a: Secondary | ICD-10-CM | POA: Diagnosis not present

## 2023-08-15 DIAGNOSIS — N179 Acute kidney failure, unspecified: Secondary | ICD-10-CM | POA: Diagnosis not present

## 2023-08-15 DIAGNOSIS — D649 Anemia, unspecified: Secondary | ICD-10-CM | POA: Diagnosis not present

## 2023-08-15 LAB — GLUCOSE, CAPILLARY: Glucose-Capillary: 129 mg/dL — ABNORMAL HIGH (ref 70–99)

## 2023-08-15 LAB — BASIC METABOLIC PANEL
Anion gap: 9 (ref 5–15)
BUN: 14 mg/dL (ref 8–23)
CO2: 21 mmol/L — ABNORMAL LOW (ref 22–32)
Calcium: 8.5 mg/dL — ABNORMAL LOW (ref 8.9–10.3)
Chloride: 111 mmol/L (ref 98–111)
Creatinine, Ser: 1.17 mg/dL — ABNORMAL HIGH (ref 0.44–1.00)
GFR, Estimated: 49 mL/min — ABNORMAL LOW (ref >60)
Glucose, Bld: 102 mg/dL — ABNORMAL HIGH (ref 70–99)
Potassium: 3.9 mmol/L (ref 3.5–5.1)
Sodium: 141 mmol/L (ref 135–145)

## 2023-08-15 MED ORDER — TELMISARTAN 80 MG PO TABS: 80.0000 mg | ORAL_TABLET | Freq: Every day | ORAL | Status: DC

## 2023-08-15 NOTE — Care Management Obs Status (Signed)
MEDICARE OBSERVATION STATUS NOTIFICATION   Patient Details  Name: Wendy Downs MRN: [id number] Date of Birth: [birth year]   Medicare Observation Status Notification Given:  Yes    Adrian Prows, RN 08/15/2023, 9:12 AM

## 2023-08-15 NOTE — Care Management Important Message (Signed)
Important Message  Patient Details  Name: Wendy Downs MRN: [id number] Date of Birth: [birth year]   Important Message Given:        Adrian Prows, RN 08/15/2023, 9:12 AM

## 2023-08-15 NOTE — Discharge Summary (Signed)
Physician Discharge Summary   Patient: Wendy Downs MRN: [id number] DOB: 04/23/1951  Admit date:     08/13/2023  Discharge date: 08/15/2023  Discharge Physician: Kathlen Mody   PCP: Merri Brunette, MD   Recommendations at discharge:  Please follow up with PCP in one week.   Discharge Diagnoses: Principal Problem:   Hypotension Active Problems:   Morbid obesity due to excess calories (HCC)   Acute kidney injury superimposed on stage 3a chronic kidney disease (HCC)   Normocytic anemia   Diabetes mellitus with hypoglycemia (HCC)  Resolved Problems:   * No resolved hospital problems. *  Hospital Course:  72 y.o. female with medical history significant of CAD, chronic diastolic heart failure, IBS with predominant diarrheal symptoms, CKD 3 A, type 2 diabetes who presents with hypotension.  She also reports intermittent diarrhea from IBS, and has been having a lot of nausea .  She was also found to be in mild AKI.   Assessment and Plan:    Hypotension -pt reports hypotension down to SBP of 80s for several days likely hypovolemic from chronic diarrhea from IBS - bp has normalized.  - restart home meds on discharge.    Diabetes mellitus with hypoglycemia (HCC) - No recent A1c but is diet controlled.  Patient reports cutting out flour and sugar from her diet  Normocytic anemia -Hgb is down to 10.8 from prior of 12.4. No melena or bleeding.  -she receives B12 injections outpatient - hemoglobin dropped to 9.3. she denies any blood in stools.  Anemia panel done, and reviewed with the patient.  She would like to talk to the PCP before making any changes in meds.    Acute kidney injury superimposed on stage 3a chronic kidney disease (HCC) -BMP notable for elevated creatinine of 1.49 with a prior of 1.32. -secondary to hypovolemia - with IV fluids creatinine improved to 1.1.    Morbid obesity due to excess calories (HCC) Body mass index is 37.54 kg/m.            Estimated body mass index is 37.54 kg/m as calculated from the following:   Height as of this encounter: 5\' 4"  (1.626 m).   Weight as of this encounter: 99.2 kg.    Consultants: none.  Procedures performed: none.   Disposition: Home Diet recommendation:  Discharge Diet Orders (From admission, onward)     Start     Ordered   08/15/23 0000  Diet - low sodium heart healthy        08/15/23 1149           Regular diet DISCHARGE MEDICATION: Allergies as of 08/15/2023       Reactions   Ezetimibe Other (See Comments)   Migraine aura's   Verapamil Other (See Comments)   Swelling in legs   Clonidine Derivatives Other (See Comments)   Ear Pain, Dizziness, Insomnia, migraine, constipation. Patient stated that medicine kept her off balance.   Kiwi Extract    Lactose Intolerance (gi) Other (See Comments)   Suspected. Intolerance to milk, yogurt and ice cream.    Other    Sugar, artificial sweeteners    Prednisone Other (See Comments)   Mood swings   Valtrex [valacyclovir Hcl] Other (See Comments)   Maybe stomach pains   Vaseretic [enalapril-hydrochlorothiazide] Other (See Comments)   Gout    Cholestyramine Anxiety, Other (See Comments), Swelling   Codeine Other (See Comments)   Pain in stomach        Medication List  STOP taking these medications    aspirin EC 81 MG tablet   furosemide 20 MG tablet Commonly known as: LASIX       TAKE these medications    albuterol 108 (90 Base) MCG/ACT inhaler Commonly known as: VENTOLIN HFA TAKE 2 PUFFS BY MOUTH EVERY 4 HOURS AS NEEDED What changed: See the new instructions.   allopurinol 100 MG tablet Commonly known as: ZYLOPRIM Take 200 mg by mouth daily as needed (gout).   cetirizine 10 MG tablet Commonly known as: ZYRTEC Take 10 mg by mouth daily as needed for allergies.   cyanocobalamin 1000 MCG/ML injection Commonly known as: VITAMIN B12 Inject 1,000 mcg into the muscle every 30 (thirty) days.    dorzolamide-timolol 2-0.5 % ophthalmic solution Commonly known as: COSOPT Place 1 drop into both eyes 2 (two) times daily.   latanoprost 0.005 % ophthalmic solution Commonly known as: XALATAN Place 1 drop into both eyes at bedtime.   MULTIVITAMIN ADULTS PO Take 1 Capful by mouth daily. Bariatric multivitamin   Nebivolol HCl 20 MG Tabs Take 20 mg by mouth at bedtime.   pantoprazole 40 MG tablet Commonly known as: PROTONIX Take 80 mg by mouth daily with breakfast.   potassium chloride 10 MEQ tablet Commonly known as: KLOR-CON Take 1 tablet (10 mEq total) by mouth daily.   prednisoLONE acetate 1 % ophthalmic suspension Commonly known as: PRED FORTE Place 1 drop into the left eye in the morning and at bedtime.   rosuvastatin 5 MG tablet Commonly known as: CRESTOR Take 1 tablet (5 mg total) by mouth every evening.   telmisartan 80 MG tablet Commonly known as: MICARDIS Take 1 tablet (80 mg total) by mouth daily. Start taking on: August 18, 2023 What changed: These instructions start on August 18, 2023. If you are unsure what to do until then, ask your doctor or other care provider.   triamcinolone cream 0.5 % Commonly known as: KENALOG Apply 1 Application topically as needed (irritation).   TURMERIC CURCUMIN PO Take 1 capsule by mouth daily.        Follow-up Information     Merri Brunette, MD. Schedule an appointment as soon as possible for a visit in 1 week(s).   Specialty: Family Medicine Contact information: [address] [place] [address] [phone]                Discharge Exam: Ceasar Mons Weights   08/13/23 2112  Weight: 99.2 kg   General exam: Appears calm and comfortable  Respiratory system: Clear to auscultation. Respiratory effort normal. Cardiovascular system: S1 & S2 heard, RRR. No JVD,  Gastrointestinal system: Abdomen is nondistended, soft and nontender.  Central nervous system: Alert and oriented. No focal  neurological deficits. Extremities: Symmetric 5 x 5 power. Skin: No rashes,  Psychiatry:Mood & affect appropriate.    Condition at discharge: fair  The results of significant diagnostics from this hospitalization (including imaging, microbiology, ancillary and laboratory) are listed below for reference.   Imaging Studies: No results found.  Microbiology: Results for orders placed or performed during the hospital encounter of 09/22/19  SARS CORONAVIRUS 2 (TAT 6-24 HRS) Nasopharyngeal Nasopharyngeal Swab     Status: None   Collection Time: 09/22/19  8:19 PM   Specimen: Nasopharyngeal Swab  Result Value Ref Range Status   SARS Coronavirus 2 NEGATIVE NEGATIVE Final    Comment: (NOTE) SARS-CoV-2 target nucleic acids are NOT DETECTED. The SARS-CoV-2 RNA is generally detectable in upper and lower respiratory specimens  during the acute phase of infection. Negative results do not preclude SARS-CoV-2 infection, do not rule out co-infections with other pathogens, and should not be used as the sole basis for treatment or other patient management decisions. Negative results must be combined with clinical observations, patient history, and epidemiological information. The expected result is Negative. Fact Sheet for Patients: HairSlick.no Fact Sheet for Healthcare Providers: quierodirigir.com This test is not yet approved or cleared by the [place] FDA and  has been authorized for detection and/or diagnosis of SARS-CoV-2 by FDA under an Emergency Use Authorization (EUA). This EUA will remain  in effect (meaning this test can be used) for the duration of the COVID-19 declaration under Section 56 4(b)(1) of the Act, 21 U.S.C. section 360bbb-3(b)(1), unless the authorization is terminated or revoked sooner. Performed at [facility], [address] [place], [place] [address]     Labs: CBC: Recent Labs  Lab 08/13/23 1157  08/14/23 0541  WBC 5.2 4.3  NEUTROABS 3.1  --   HGB 10.8* 9.3*  HCT 33.7* 30.2*  MCV 96.3 100.7*  PLT 199 158   Basic Metabolic Panel: Recent Labs  Lab 08/13/23 1157 08/14/23 0541 08/15/23 1055  NA 137 139 141  K 4.0 3.8 3.9  CL 108 111 111  CO2 22 21* 21*  GLUCOSE 92 88 102*  BUN 20 17 14   CREATININE 1.49* 1.22* 1.17*  CALCIUM 8.8* 8.2* 8.5*   Liver Function Tests: Recent Labs  Lab 08/13/23 1157  AST 19  ALT 17  ALKPHOS 50  BILITOT 1.2  PROT 6.5  ALBUMIN 4.1   CBG: Recent Labs  Lab 08/14/23 0816 08/14/23 1213 08/14/23 1710 08/14/23 2012 08/14/23 2358  GLUCAP 83 92 83 96 129*    Discharge time spent: 40 minutes.   Signed: Kathlen Mody, MD [facility] 08/15/2023

## 2023-08-15 NOTE — TOC Initial Note (Signed)
Transition of Care [facility]) - Initial/Assessment Note    Patient Details  Name: Wendy Downs MRN: [id number] Date of Birth: [birth year]  Transition of [facility]) CM/SW Contact:    Adrian Prows, RN Phone Number: 08/15/2023, 9:17 AM  Clinical Narrative:                 Sherron Monday w/ pt in room; pt says she is from home and plans to return at d/c; she identified POC Pandora Leiter (other) [phone] and Flora Lipps (sister) [phone]; pt denies SDOH risks; she has transportation; pt verified PCP and insurance; pt says she has glasses and partials (upper/lower); she does not have HH services, DME, or home oxygen; no TOC needs; TOC signing off; please place consult if needed.  Expected Discharge Plan: Home/Self Care Barriers to Discharge: Continued Medical Work up   Patient Goals and CMS Choice Patient states their goals for this hospitalization and ongoing recovery are:: home          Expected Discharge Plan and Services   Discharge Planning Services: CM Consult   Living arrangements for the past 2 months: Single Family Home                                      Prior Living Arrangements/Services Living arrangements for the past 2 months: Single Family Home Lives with:: Self Patient language and need for interpreter reviewed:: Yes Do you feel safe going back to the place where you live?: Yes      Need for Family Participation in Patient Care: Yes (Comment)   Current home services:  (n/a) Criminal Activity/Legal Involvement Pertinent to Current Situation/Hospitalization: No - Comment as needed  Activities of Daily Living   ADL Screening (condition at time of admission) Independently performs ADLs?: Yes (appropriate for developmental age) Is the patient deaf or have difficulty hearing?: No Does the patient have difficulty seeing, even when wearing glasses/contacts?: No Does the patient have difficulty concentrating, remembering, or making decisions?:  No  Permission Sought/Granted Permission sought to share information with : Case Manager Permission granted to share information with : Yes, Verbal Permission Granted  Share Information with NAME: Case Manager     Permission granted to share info w Relationship: Pandora Leiter (other) [phone] / Flora Lipps (sister) [phone]     Emotional Assessment Appearance:: Appears stated age Attitude/Demeanor/Rapport: Gracious Affect (typically observed): Accepting Orientation: : Oriented to Self, Oriented to Place, Oriented to  Time, Oriented to Situation Alcohol / Substance Use: Not Applicable Psych Involvement: No (comment)  Admission diagnosis:  Hypotension [I95.9] Patient Active Problem List   Diagnosis Date Noted   Hypotension 08/13/2023   Acute kidney injury superimposed on stage 3a chronic kidney disease (HCC) 08/13/2023   Normocytic anemia 08/13/2023   Diabetes mellitus with hypoglycemia (HCC) 08/13/2023   TIA (transient ischemic attack) 09/22/2019   Hyperlipidemia    Gout    Diabetes mellitus without complication (HCC)    Dyspnea on exertion 03/18/2015   PULMONARY SARCOIDOSIS 02/20/2010   HEADACHE, CHRONIC 02/20/2010   HYPERCHOLESTEROLEMIA 02/19/2010   Morbid obesity due to excess calories (HCC) 02/19/2010   ANEMIA, IRON DEFICIENCY 02/19/2010   ANEMIA, PERNICIOUS 02/19/2010   ANEMIA, B12 DEFICIENCY 02/19/2010   ANXIETY 02/19/2010   GLAUCOMA 02/19/2010   Hypertension 02/19/2010   ALLERGIC RHINITIS 02/19/2010   GASTROESOPHAGEAL REFLUX DISEASE 02/19/2010   PCP:  Merri Brunette, MD Pharmacy:   [facility] [phone] -  [place], [place] - [address] [place] [place]  [address] Phone: [phone] Fax: [phone]  [facility] [address] [place] [address] Phone: [phone] Fax: [phone]     Social Determinants of Health (SDOH) Social History: SDOH Screenings   Food Insecurity:  No Food Insecurity (08/15/2023)  Housing: Low Risk  (08/15/2023)  Transportation Needs: No Transportation Needs (08/15/2023)  Utilities: Not At Risk (08/15/2023)  Depression (PHQ2-9): Low Risk  (09/06/2021)  Tobacco Use: Low Risk  (08/14/2023)   SDOH Interventions: Food Insecurity Interventions: Intervention Not Indicated, Inpatient TOC Housing Interventions: Intervention Not Indicated, Inpatient TOC Transportation Interventions: Intervention Not Indicated, Inpatient TOC Utilities Interventions: Intervention Not Indicated, Inpatient TOC   Readmission Risk Interventions     No data to display

## 2023-08-15 NOTE — Progress Notes (Signed)
Pt had a concern if she's getting more IV fluids because that's the reason why she stayed for one more night. IV fluids restarted. After 2 hours, pt complained of tightness and numbness in her both lower extremities. Neuro assessment normal. IV fluids stopped. Able to pee without problems. Clear to auscultation. BP 137/74; HR 66; RR 19; O2 sat 100% on RA. No complaints of congestions, palpitations and shortness of breath. Pt is still alert and oriented. Able to walk to the bathroom with standby assistance. No change in balance during ambulation. On call A. Virgel Manifold, NP and Charge nurse informed. Will continue to monitor.

## 2023-08-19 DIAGNOSIS — I5032 Chronic diastolic (congestive) heart failure: Secondary | ICD-10-CM | POA: Diagnosis not present

## 2023-08-19 DIAGNOSIS — E1122 Type 2 diabetes mellitus with diabetic chronic kidney disease: Secondary | ICD-10-CM | POA: Diagnosis not present

## 2023-08-19 DIAGNOSIS — K529 Noninfective gastroenteritis and colitis, unspecified: Secondary | ICD-10-CM | POA: Diagnosis not present

## 2023-08-19 DIAGNOSIS — N1831 Chronic kidney disease, stage 3a: Secondary | ICD-10-CM | POA: Diagnosis not present

## 2023-08-19 DIAGNOSIS — N179 Acute kidney failure, unspecified: Secondary | ICD-10-CM | POA: Diagnosis not present

## 2023-08-19 DIAGNOSIS — D631 Anemia in chronic kidney disease: Secondary | ICD-10-CM | POA: Diagnosis not present

## 2023-08-19 DIAGNOSIS — Z23 Encounter for immunization: Secondary | ICD-10-CM | POA: Diagnosis not present

## 2023-08-19 DIAGNOSIS — I959 Hypotension, unspecified: Secondary | ICD-10-CM | POA: Diagnosis not present

## 2023-09-02 DIAGNOSIS — R197 Diarrhea, unspecified: Secondary | ICD-10-CM | POA: Diagnosis not present

## 2023-09-08 DIAGNOSIS — E538 Deficiency of other specified B group vitamins: Secondary | ICD-10-CM | POA: Diagnosis not present

## 2023-09-15 DIAGNOSIS — K5730 Diverticulosis of large intestine without perforation or abscess without bleeding: Secondary | ICD-10-CM | POA: Diagnosis not present

## 2023-09-15 DIAGNOSIS — R197 Diarrhea, unspecified: Secondary | ICD-10-CM | POA: Diagnosis not present

## 2023-09-15 DIAGNOSIS — K648 Other hemorrhoids: Secondary | ICD-10-CM | POA: Diagnosis not present

## 2023-09-15 DIAGNOSIS — D124 Benign neoplasm of descending colon: Secondary | ICD-10-CM | POA: Diagnosis not present

## 2023-09-17 DIAGNOSIS — D124 Benign neoplasm of descending colon: Secondary | ICD-10-CM | POA: Diagnosis not present

## 2023-09-27 DIAGNOSIS — E119 Type 2 diabetes mellitus without complications: Secondary | ICD-10-CM | POA: Diagnosis not present

## 2023-09-27 DIAGNOSIS — Z0000 Encounter for general adult medical examination without abnormal findings: Secondary | ICD-10-CM | POA: Diagnosis not present

## 2023-09-27 DIAGNOSIS — E1122 Type 2 diabetes mellitus with diabetic chronic kidney disease: Secondary | ICD-10-CM | POA: Diagnosis not present

## 2023-09-27 DIAGNOSIS — N1831 Chronic kidney disease, stage 3a: Secondary | ICD-10-CM | POA: Diagnosis not present

## 2023-09-27 DIAGNOSIS — D508 Other iron deficiency anemias: Secondary | ICD-10-CM | POA: Diagnosis not present

## 2023-09-27 DIAGNOSIS — E782 Mixed hyperlipidemia: Secondary | ICD-10-CM | POA: Diagnosis not present

## 2023-09-27 DIAGNOSIS — H401113 Primary open-angle glaucoma, right eye, severe stage: Secondary | ICD-10-CM | POA: Diagnosis not present

## 2023-09-27 DIAGNOSIS — I130 Hypertensive heart and chronic kidney disease with heart failure and stage 1 through stage 4 chronic kidney disease, or unspecified chronic kidney disease: Secondary | ICD-10-CM | POA: Diagnosis not present

## 2023-09-27 DIAGNOSIS — I5032 Chronic diastolic (congestive) heart failure: Secondary | ICD-10-CM | POA: Diagnosis not present

## 2023-09-27 DIAGNOSIS — D860 Sarcoidosis of lung: Secondary | ICD-10-CM | POA: Diagnosis not present

## 2023-09-27 DIAGNOSIS — G43109 Migraine with aura, not intractable, without status migrainosus: Secondary | ICD-10-CM | POA: Diagnosis not present

## 2023-10-08 DIAGNOSIS — E538 Deficiency of other specified B group vitamins: Secondary | ICD-10-CM | POA: Diagnosis not present

## 2023-11-02 DIAGNOSIS — H401112 Primary open-angle glaucoma, right eye, moderate stage: Secondary | ICD-10-CM | POA: Diagnosis not present

## 2023-11-02 DIAGNOSIS — H401123 Primary open-angle glaucoma, left eye, severe stage: Secondary | ICD-10-CM | POA: Diagnosis not present

## 2023-11-10 DIAGNOSIS — E538 Deficiency of other specified B group vitamins: Secondary | ICD-10-CM | POA: Diagnosis not present

## 2023-11-11 ENCOUNTER — Ambulatory Visit: Payer: Medicare Other | Admitting: Dietician

## 2023-11-19 ENCOUNTER — Other Ambulatory Visit: Payer: Self-pay | Admitting: Nurse Practitioner

## 2023-11-21 ENCOUNTER — Other Ambulatory Visit: Payer: Self-pay | Admitting: Nurse Practitioner

## 2023-12-06 ENCOUNTER — Ambulatory Visit
Admission: RE | Admit: 2023-12-06 | Discharge: 2023-12-06 | Disposition: A | Discharge status: Home / Self Care | Payer: Medicare Other | Source: Ambulatory Visit | Attending: Family Medicine | Admitting: Family Medicine

## 2023-12-06 DIAGNOSIS — Z1231 Encounter for screening mammogram for malignant neoplasm of breast: Secondary | ICD-10-CM

## 2023-12-08 DIAGNOSIS — E538 Deficiency of other specified B group vitamins: Secondary | ICD-10-CM | POA: Diagnosis not present

## 2023-12-15 DIAGNOSIS — S39012A Strain of muscle, fascia and tendon of lower back, initial encounter: Secondary | ICD-10-CM | POA: Diagnosis not present

## 2023-12-28 DIAGNOSIS — M109 Gout, unspecified: Secondary | ICD-10-CM | POA: Diagnosis not present

## 2024-01-05 DIAGNOSIS — E538 Deficiency of other specified B group vitamins: Secondary | ICD-10-CM | POA: Diagnosis not present

## 2024-01-19 ENCOUNTER — Other Ambulatory Visit: Payer: Self-pay | Admitting: Family Medicine

## 2024-01-19 DIAGNOSIS — R0989 Other specified symptoms and signs involving the circulatory and respiratory systems: Secondary | ICD-10-CM

## 2024-02-01 ENCOUNTER — Ambulatory Visit
Admission: RE | Admit: 2024-02-01 | Discharge: 2024-02-01 | Disposition: A | Discharge status: Home / Self Care | Source: Ambulatory Visit | Attending: Family Medicine | Admitting: Family Medicine

## 2024-02-01 DIAGNOSIS — R0989 Other specified symptoms and signs involving the circulatory and respiratory systems: Secondary | ICD-10-CM

## 2024-02-01 DIAGNOSIS — I10 Essential (primary) hypertension: Secondary | ICD-10-CM | POA: Diagnosis not present

## 2024-02-08 DIAGNOSIS — E538 Deficiency of other specified B group vitamins: Secondary | ICD-10-CM | POA: Diagnosis not present

## 2024-02-08 DIAGNOSIS — M109 Gout, unspecified: Secondary | ICD-10-CM | POA: Diagnosis not present

## 2024-02-18 DIAGNOSIS — R197 Diarrhea, unspecified: Secondary | ICD-10-CM | POA: Diagnosis not present

## 2024-02-18 DIAGNOSIS — K219 Gastro-esophageal reflux disease without esophagitis: Secondary | ICD-10-CM | POA: Diagnosis not present

## 2024-03-09 DIAGNOSIS — E538 Deficiency of other specified B group vitamins: Secondary | ICD-10-CM | POA: Diagnosis not present

## 2024-04-06 DIAGNOSIS — I10 Essential (primary) hypertension: Secondary | ICD-10-CM | POA: Diagnosis not present

## 2024-04-06 DIAGNOSIS — E1169 Type 2 diabetes mellitus with other specified complication: Secondary | ICD-10-CM | POA: Diagnosis not present

## 2024-04-06 DIAGNOSIS — N1832 Chronic kidney disease, stage 3b: Secondary | ICD-10-CM | POA: Diagnosis not present

## 2024-04-06 DIAGNOSIS — D860 Sarcoidosis of lung: Secondary | ICD-10-CM | POA: Diagnosis not present

## 2024-04-06 DIAGNOSIS — I5032 Chronic diastolic (congestive) heart failure: Secondary | ICD-10-CM | POA: Diagnosis not present

## 2024-04-06 DIAGNOSIS — G4733 Obstructive sleep apnea (adult) (pediatric): Secondary | ICD-10-CM | POA: Diagnosis not present

## 2024-04-06 DIAGNOSIS — E538 Deficiency of other specified B group vitamins: Secondary | ICD-10-CM | POA: Diagnosis not present

## 2024-04-06 DIAGNOSIS — E782 Mixed hyperlipidemia: Secondary | ICD-10-CM | POA: Diagnosis not present

## 2024-04-10 DIAGNOSIS — M4206 Juvenile osteochondrosis of spine, lumbar region: Secondary | ICD-10-CM | POA: Diagnosis not present

## 2024-04-10 DIAGNOSIS — M175 Other unilateral secondary osteoarthritis of knee: Secondary | ICD-10-CM | POA: Diagnosis not present

## 2024-05-02 DIAGNOSIS — H401112 Primary open-angle glaucoma, right eye, moderate stage: Secondary | ICD-10-CM | POA: Diagnosis not present

## 2024-05-05 DIAGNOSIS — E538 Deficiency of other specified B group vitamins: Secondary | ICD-10-CM | POA: Diagnosis not present

## 2024-05-11 DIAGNOSIS — H401113 Primary open-angle glaucoma, right eye, severe stage: Secondary | ICD-10-CM | POA: Diagnosis not present

## 2024-05-11 DIAGNOSIS — E782 Mixed hyperlipidemia: Secondary | ICD-10-CM | POA: Diagnosis not present

## 2024-05-11 DIAGNOSIS — E1169 Type 2 diabetes mellitus with other specified complication: Secondary | ICD-10-CM | POA: Diagnosis not present

## 2024-05-11 DIAGNOSIS — E119 Type 2 diabetes mellitus without complications: Secondary | ICD-10-CM | POA: Diagnosis not present

## 2024-06-01 DIAGNOSIS — R1012 Left upper quadrant pain: Secondary | ICD-10-CM | POA: Diagnosis not present

## 2024-06-01 DIAGNOSIS — R197 Diarrhea, unspecified: Secondary | ICD-10-CM | POA: Diagnosis not present

## 2024-06-05 DIAGNOSIS — E538 Deficiency of other specified B group vitamins: Secondary | ICD-10-CM | POA: Diagnosis not present

## 2024-06-11 DIAGNOSIS — E1169 Type 2 diabetes mellitus with other specified complication: Secondary | ICD-10-CM | POA: Diagnosis not present

## 2024-06-11 DIAGNOSIS — H401113 Primary open-angle glaucoma, right eye, severe stage: Secondary | ICD-10-CM | POA: Diagnosis not present

## 2024-06-11 DIAGNOSIS — E782 Mixed hyperlipidemia: Secondary | ICD-10-CM | POA: Diagnosis not present

## 2024-06-11 DIAGNOSIS — E119 Type 2 diabetes mellitus without complications: Secondary | ICD-10-CM | POA: Diagnosis not present

## 2024-07-06 DIAGNOSIS — E538 Deficiency of other specified B group vitamins: Secondary | ICD-10-CM | POA: Diagnosis not present

## 2024-07-11 DIAGNOSIS — E1169 Type 2 diabetes mellitus with other specified complication: Secondary | ICD-10-CM | POA: Diagnosis not present

## 2024-07-11 DIAGNOSIS — H401113 Primary open-angle glaucoma, right eye, severe stage: Secondary | ICD-10-CM | POA: Diagnosis not present

## 2024-07-11 DIAGNOSIS — E782 Mixed hyperlipidemia: Secondary | ICD-10-CM | POA: Diagnosis not present

## 2024-07-11 DIAGNOSIS — E119 Type 2 diabetes mellitus without complications: Secondary | ICD-10-CM | POA: Diagnosis not present

## 2024-08-03 DIAGNOSIS — E538 Deficiency of other specified B group vitamins: Secondary | ICD-10-CM | POA: Diagnosis not present

## 2024-08-03 DIAGNOSIS — Z23 Encounter for immunization: Secondary | ICD-10-CM | POA: Diagnosis not present

## 2024-08-08 DIAGNOSIS — H401113 Primary open-angle glaucoma, right eye, severe stage: Secondary | ICD-10-CM | POA: Diagnosis not present

## 2024-08-08 DIAGNOSIS — H401123 Primary open-angle glaucoma, left eye, severe stage: Secondary | ICD-10-CM | POA: Diagnosis not present

## 2024-08-08 DIAGNOSIS — H401112 Primary open-angle glaucoma, right eye, moderate stage: Secondary | ICD-10-CM | POA: Diagnosis not present

## 2024-08-20 ENCOUNTER — Other Ambulatory Visit: Payer: Self-pay | Admitting: Cardiology

## 2024-08-21 ENCOUNTER — Other Ambulatory Visit: Payer: Self-pay

## 2024-08-21 ENCOUNTER — Emergency Department (HOSPITAL_COMMUNITY)

## 2024-08-21 ENCOUNTER — Observation Stay (HOSPITAL_COMMUNITY)
Admission: EM | Admit: 2024-08-21 | Discharge: 2024-08-23 | Disposition: A | Attending: Emergency Medicine | Admitting: Emergency Medicine

## 2024-08-21 ENCOUNTER — Encounter (HOSPITAL_COMMUNITY): Payer: Self-pay

## 2024-08-21 DIAGNOSIS — N201 Calculus of ureter: Secondary | ICD-10-CM | POA: Diagnosis not present

## 2024-08-21 DIAGNOSIS — R112 Nausea with vomiting, unspecified: Secondary | ICD-10-CM | POA: Diagnosis present

## 2024-08-21 DIAGNOSIS — E876 Hypokalemia: Secondary | ICD-10-CM | POA: Diagnosis not present

## 2024-08-21 DIAGNOSIS — E785 Hyperlipidemia, unspecified: Secondary | ICD-10-CM | POA: Insufficient documentation

## 2024-08-21 DIAGNOSIS — E119 Type 2 diabetes mellitus without complications: Secondary | ICD-10-CM | POA: Diagnosis not present

## 2024-08-21 DIAGNOSIS — N289 Disorder of kidney and ureter, unspecified: Secondary | ICD-10-CM | POA: Insufficient documentation

## 2024-08-21 DIAGNOSIS — K5732 Diverticulitis of large intestine without perforation or abscess without bleeding: Secondary | ICD-10-CM | POA: Diagnosis not present

## 2024-08-21 DIAGNOSIS — I130 Hypertensive heart and chronic kidney disease with heart failure and stage 1 through stage 4 chronic kidney disease, or unspecified chronic kidney disease: Secondary | ICD-10-CM | POA: Insufficient documentation

## 2024-08-21 DIAGNOSIS — N1831 Chronic kidney disease, stage 3a: Secondary | ICD-10-CM | POA: Diagnosis not present

## 2024-08-21 DIAGNOSIS — I2510 Atherosclerotic heart disease of native coronary artery without angina pectoris: Secondary | ICD-10-CM | POA: Insufficient documentation

## 2024-08-21 DIAGNOSIS — K5792 Diverticulitis of intestine, part unspecified, without perforation or abscess without bleeding: Secondary | ICD-10-CM | POA: Diagnosis not present

## 2024-08-21 DIAGNOSIS — D869 Sarcoidosis, unspecified: Secondary | ICD-10-CM | POA: Diagnosis present

## 2024-08-21 DIAGNOSIS — I10 Essential (primary) hypertension: Secondary | ICD-10-CM | POA: Diagnosis present

## 2024-08-21 DIAGNOSIS — I5032 Chronic diastolic (congestive) heart failure: Secondary | ICD-10-CM | POA: Insufficient documentation

## 2024-08-21 DIAGNOSIS — N183 Chronic kidney disease, stage 3 unspecified: Secondary | ICD-10-CM | POA: Insufficient documentation

## 2024-08-21 DIAGNOSIS — E66812 Obesity, class 2: Secondary | ICD-10-CM | POA: Diagnosis not present

## 2024-08-21 DIAGNOSIS — N132 Hydronephrosis with renal and ureteral calculous obstruction: Secondary | ICD-10-CM | POA: Insufficient documentation

## 2024-08-21 DIAGNOSIS — Z79899 Other long term (current) drug therapy: Secondary | ICD-10-CM | POA: Diagnosis not present

## 2024-08-21 DIAGNOSIS — N179 Acute kidney failure, unspecified: Secondary | ICD-10-CM | POA: Diagnosis present

## 2024-08-21 DIAGNOSIS — E7800 Pure hypercholesterolemia, unspecified: Secondary | ICD-10-CM | POA: Diagnosis present

## 2024-08-21 DIAGNOSIS — E1122 Type 2 diabetes mellitus with diabetic chronic kidney disease: Secondary | ICD-10-CM | POA: Insufficient documentation

## 2024-08-21 DIAGNOSIS — N200 Calculus of kidney: Principal | ICD-10-CM

## 2024-08-21 LAB — URINALYSIS, ROUTINE W REFLEX MICROSCOPIC
Bacteria, UA: NONE SEEN
Bilirubin Urine: NEGATIVE
Glucose, UA: NEGATIVE mg/dL
Ketones, ur: 5 mg/dL — AB
Leukocytes,Ua: NEGATIVE
Nitrite: NEGATIVE
Protein, ur: 30 mg/dL — AB
Specific Gravity, Urine: 1.016 (ref 1.005–1.030)
pH: 5 (ref 5.0–8.0)

## 2024-08-21 LAB — CBC WITH DIFFERENTIAL/PLATELET
Abs Immature Granulocytes: 0.02 K/uL (ref 0.00–0.07)
Basophils Absolute: 0 K/uL (ref 0.0–0.1)
Basophils Relative: 0 %
Eosinophils Absolute: 0 K/uL (ref 0.0–0.5)
Eosinophils Relative: 0 %
HCT: 38.5 % (ref 36.0–46.0)
Hemoglobin: 12.4 g/dL (ref 12.0–15.0)
Immature Granulocytes: 0 %
Lymphocytes Relative: 16 %
Lymphs Abs: 1.1 K/uL (ref 0.7–4.0)
MCH: 31 pg (ref 26.0–34.0)
MCHC: 32.2 g/dL (ref 30.0–36.0)
MCV: 96.3 fL (ref 80.0–100.0)
Monocytes Absolute: 0.4 K/uL (ref 0.1–1.0)
Monocytes Relative: 6 %
Neutro Abs: 5.4 K/uL (ref 1.7–7.7)
Neutrophils Relative %: 78 %
Platelets: 253 K/uL (ref 150–400)
RBC: 4 MIL/uL (ref 3.87–5.11)
RDW: 12.9 % (ref 11.5–15.5)
WBC: 6.9 K/uL (ref 4.0–10.5)
nRBC: 0 % (ref 0.0–0.2)

## 2024-08-21 LAB — COMPREHENSIVE METABOLIC PANEL WITH GFR
ALT: 22 U/L (ref 0–44)
AST: 29 U/L (ref 15–41)
Albumin: 4.2 g/dL (ref 3.5–5.0)
Alkaline Phosphatase: 66 U/L (ref 38–126)
Anion gap: 12 (ref 5–15)
BUN: 15 mg/dL (ref 8–23)
CO2: 23 mmol/L (ref 22–32)
Calcium: 9.3 mg/dL (ref 8.9–10.3)
Chloride: 109 mmol/L (ref 98–111)
Creatinine, Ser: 1.65 mg/dL — ABNORMAL HIGH (ref 0.44–1.00)
GFR, Estimated: 32 mL/min — ABNORMAL LOW (ref >60)
Glucose, Bld: 138 mg/dL — ABNORMAL HIGH (ref 70–99)
Potassium: 3.6 mmol/L (ref 3.5–5.1)
Sodium: 144 mmol/L (ref 135–145)
Total Bilirubin: 1 mg/dL (ref 0.0–1.2)
Total Protein: 7.7 g/dL (ref 6.5–8.1)

## 2024-08-21 LAB — LIPASE, BLOOD: Lipase: 22 U/L (ref 11–51)

## 2024-08-21 MED ORDER — SODIUM CHLORIDE 0.9 % IV SOLN
2.0000 g | INTRAVENOUS | Status: DC
  Administered 2024-08-22 – 2024-08-23 (×2): 2 g via INTRAVENOUS
  Filled 2024-08-21 (×2): qty 20

## 2024-08-21 MED ORDER — ONDANSETRON HCL 4 MG/2ML IJ SOLN
4.0000 mg | Freq: Once | INTRAMUSCULAR | Status: AC
  Administered 2024-08-21: 4 mg via INTRAVENOUS
  Filled 2024-08-21: qty 2

## 2024-08-21 MED ORDER — OXYCODONE-ACETAMINOPHEN 5-325 MG PO TABS
1.0000 | ORAL_TABLET | Freq: Once | ORAL | Status: DC
  Filled 2024-08-21: qty 1

## 2024-08-21 MED ORDER — LATANOPROST 0.005 % OP SOLN
1.0000 [drp] | Freq: Every day | OPHTHALMIC | Status: DC
  Administered 2024-08-22 (×2): 1 [drp] via OPHTHALMIC
  Filled 2024-08-21: qty 2.5

## 2024-08-21 MED ORDER — HEPARIN SODIUM (PORCINE) 5000 UNIT/ML IJ SOLN
5000.0000 [IU] | Freq: Three times a day (TID) | INTRAMUSCULAR | Status: DC
  Administered 2024-08-22 (×3): 5000 [IU] via SUBCUTANEOUS
  Filled 2024-08-21 (×3): qty 1

## 2024-08-21 MED ORDER — FENTANYL CITRATE (PF) 50 MCG/ML IJ SOSY
50.0000 ug | PREFILLED_SYRINGE | Freq: Once | INTRAMUSCULAR | Status: AC
  Administered 2024-08-21: 50 ug via INTRAVENOUS
  Filled 2024-08-21: qty 1

## 2024-08-21 MED ORDER — AMOXICILLIN-POT CLAVULANATE 875-125 MG PO TABS
1.0000 | ORAL_TABLET | Freq: Once | ORAL | Status: DC
  Filled 2024-08-21: qty 1

## 2024-08-21 MED ORDER — ROSUVASTATIN CALCIUM 5 MG PO TABS
5.0000 mg | ORAL_TABLET | Freq: Every day | ORAL | Status: DC
  Administered 2024-08-22 (×2): 5 mg via ORAL
  Filled 2024-08-21 (×2): qty 1

## 2024-08-21 MED ORDER — SODIUM CHLORIDE 0.9 % IV BOLUS
500.0000 mL | Freq: Once | INTRAVENOUS | Status: AC
  Administered 2024-08-21: 500 mL via INTRAVENOUS

## 2024-08-21 MED ORDER — DORZOLAMIDE HCL-TIMOLOL MAL 2-0.5 % OP SOLN
1.0000 [drp] | Freq: Two times a day (BID) | OPHTHALMIC | Status: DC
  Administered 2024-08-22 – 2024-08-23 (×4): 1 [drp] via OPHTHALMIC
  Filled 2024-08-21: qty 10

## 2024-08-21 MED ORDER — METRONIDAZOLE 500 MG/100ML IV SOLN
500.0000 mg | Freq: Once | INTRAVENOUS | Status: AC
  Administered 2024-08-21: 500 mg via INTRAVENOUS
  Filled 2024-08-21: qty 100

## 2024-08-21 MED ORDER — SODIUM CHLORIDE 0.9 % IV SOLN
2.0000 g | Freq: Once | INTRAVENOUS | Status: AC
  Administered 2024-08-21: 2 g via INTRAVENOUS
  Filled 2024-08-21: qty 20

## 2024-08-21 MED ORDER — NEBIVOLOL HCL 10 MG PO TABS
20.0000 mg | ORAL_TABLET | Freq: Every day | ORAL | Status: DC
  Administered 2024-08-22 (×2): 20 mg via ORAL
  Filled 2024-08-21 (×2): qty 2

## 2024-08-21 MED ORDER — METRONIDAZOLE 500 MG/100ML IV SOLN
500.0000 mg | Freq: Two times a day (BID) | INTRAVENOUS | Status: DC
  Administered 2024-08-22 – 2024-08-23 (×2): 500 mg via INTRAVENOUS
  Filled 2024-08-21 (×2): qty 100

## 2024-08-21 MED ORDER — ACETAMINOPHEN 650 MG RE SUPP: 650.0000 mg | Freq: Four times a day (QID) | RECTAL | Status: DC | PRN

## 2024-08-21 MED ORDER — INSULIN ASPART 100 UNIT/ML IJ SOLN: 0.0000 [IU] | Freq: Three times a day (TID) | INTRAMUSCULAR | Status: DC

## 2024-08-21 MED ORDER — ACETAMINOPHEN 325 MG PO TABS: 650.0000 mg | ORAL_TABLET | Freq: Four times a day (QID) | ORAL | Status: DC | PRN

## 2024-08-21 MED ORDER — IRBESARTAN 300 MG PO TABS
300.0000 mg | ORAL_TABLET | Freq: Every day | ORAL | Status: DC
  Administered 2024-08-22: 300 mg via ORAL
  Filled 2024-08-21: qty 1

## 2024-08-21 NOTE — ED Triage Notes (Signed)
 Pt reports with abdominal pain and lower back pain with vomiting since today.

## 2024-08-21 NOTE — H&P (Signed)
 History and Physical    Wendy Downs FMW:[id number] DOB: [birth year] DOA: 08/21/2024  Patient coming from: Home.  Chief Complaint: Left flank pain.  HPI: Wendy Downs is a 73 y.o. female with history of chronic diastolic CHF nonobstructive CAD, diabetes mellitus type 2 on diet, chronic kidney disease stage III, hypertension presents to the ER with complaints of left flank pain.  Patient states over the last 3 to 4 days she has been having left lower quadrant pain.  Today she started experiencing left flank pain with nausea vomiting and decided to come to the ER.  Denies any chest pain or shortness of breath.  ED Course: In the ER patient had CT abdomen pelvis which shows acute noncomplicated diverticulitis of the distal colon and also left-sided moderate hydronephrosis with 4 mm stone at the left UVJ.  Creatinine mildly increased from baseline.  Since patient has acute diverticulitis and has been having ongoing nausea vomiting admitted for IV antibiotics and observation.    Review of Systems: As per HPI, rest all negative.   Past Medical History:  Diagnosis Date   Diabetes mellitus without complication (HCC)    Gout    Hyperlipidemia    Hypertension    IBS (irritable bowel syndrome)    Morbid obesity (HCC)    Shingles     Past Surgical History:  Procedure Laterality Date   ABDOMINAL HYSTERECTOMY     APPENDECTOMY     CARPAL TUNNEL RELEASE     CHOLECYSTECTOMY       reports that she has never smoked. She has never used smokeless tobacco. She reports that she does not drink alcohol and does not use drugs.  Allergies  Allergen Reactions   Ezetimibe  Other (See Comments)    Migraine aura's   Verapamil Other (See Comments)    Swelling in legs   Clonidine Derivatives Other (See Comments)    Ear Pain, Dizziness, Insomnia, migraine, constipation. Patient stated that medicine kept her off balance.   Kiwi Extract    Lactose Intolerance (Gi) Other (See Comments)     Suspected. Intolerance to milk, yogurt and ice cream.    Other     Sugar, artificial sweeteners    Prednisone  Other (See Comments)    Mood swings   Valtrex [Valacyclovir Hcl] Other (See Comments)    Maybe stomach pains   Vaseretic [Enalapril-Hydrochlorothiazide] Other (See Comments)    Gout    Cholestyramine Anxiety, Other (See Comments) and Swelling   Codeine Other (See Comments)    Pain in stomach    Family History  Problem Relation Age of Onset   Heart attack Mother    Hypertension Mother    Stroke Father    Heart disease Sister    Stroke Sister    Heart disease Brother    Stroke Brother    Stroke Sister    Aneurysm Brother     Prior to Admission medications   Medication Sig Start Date End Date Taking? Authorizing Provider  albuterol  (PROVENTIL  HFA;VENTOLIN  HFA) 108 (90 Base) MCG/ACT inhaler TAKE 2 PUFFS BY MOUTH EVERY 4 HOURS AS NEEDED Patient taking differently: Inhale 1 puff into the lungs as needed for shortness of breath or wheezing. 01/27/18  Yes Darlean Ozell NOVAK, MD  allopurinol (ZYLOPRIM) 100 MG tablet Take 200 mg by mouth daily as needed (gout).   Yes [provider]  cetirizine (ZYRTEC) 10 MG tablet Take 10 mg by mouth daily as needed for allergies.   Yes [provider]  cyanocobalamin  (,  VITAMIN B-12,) 1000 MCG/ML injection Inject 1,000 mcg into the muscle every 30 (thirty) days.   Yes [provider]  dorzolamide-timolol (COSOPT) 2-0.5 % ophthalmic solution Place 1 drop into both eyes 2 (two) times daily.   Yes [provider]  furosemide (LASIX) 20 MG tablet Take 20 mg by mouth daily. 06/09/24  Yes [provider]  latanoprost (XALATAN) 0.005 % ophthalmic solution Place 1 drop into both eyes at bedtime.   Yes [provider]  Multiple Vitamins-Minerals (MULTIVITAMIN ADULTS PO) Take 5 mLs by mouth daily. Bariatric multivitamin   Yes [provider]  Nebivolol  HCl 20 MG TABS Take 20 mg by mouth at bedtime.    Yes [provider]  pantoprazole  (PROTONIX ) 40 MG tablet Take 40 mg by mouth 2 (two) times daily.   Yes [provider]  prednisoLONE acetate (PRED FORTE) 1 % ophthalmic suspension Place 1 drop into the left eye in the morning and at bedtime. 09/12/18  Yes [provider]  rosuvastatin  (CRESTOR ) 5 MG tablet TAKE 1 TABLET BY MOUTH EVERY DAY IN THE EVENING 11/19/23  Yes Jeffrie Oneil BROCKS, MD  telmisartan  (MICARDIS ) 80 MG tablet Take 1 tablet (80 mg total) by mouth daily. 08/18/23  Yes Akula, Vijaya, MD  triamcinolone cream (KENALOG) 0.5 % Apply 1 Application topically as needed (irritation). 08/03/23  Yes [provider]  TURMERIC CURCUMIN PO Take 1 capsule by mouth daily.   Yes [provider]  potassium chloride  (KLOR-CON ) 10 MEQ tablet TAKE 1 TABLET BY MOUTH EVERY DAY Patient not taking: Reported on 08/21/2024 11/23/23   Jeffrie Oneil BROCKS, MD    Physical Exam: Constitutional: Moderately built and nourished. Vitals:   08/21/24 1915 08/21/24 1930 08/21/24 2012 08/21/24 2247  BP: (!) 144/91 (!) 144/75 (!) 144/75 124/79  Pulse: 78  78 79  Resp:   16 18  Temp:   98.2 F (36.8 C) 97.9 F (36.6 C)  TempSrc:   Oral   SpO2: 95%  95% 98%   Eyes: Anicteric no pallor. ENMT: No discharge from the ears eyes nose or mouth. Neck: No mass felt.  No neck rigidity. Respiratory: No rhonchi or crepitations. Cardiovascular: S1-S2 heard. Abdomen: Soft nontender bowel sound present. Musculoskeletal: No edema. Skin: No rash. Neurologic: Alert awake oriented time place and person.  Moves all extremities. Psychiatric: Appears normal.  Normal affect.   Labs on Admission: I have personally reviewed following labs and imaging studies  CBC: Recent Labs  Lab 08/21/24 1520  WBC 6.9  NEUTROABS 5.4  HGB 12.4  HCT 38.5  MCV 96.3  PLT 253   Basic Metabolic Panel: Recent Labs  Lab 08/21/24 1520  NA 144  K 3.6  CL 109  CO2 23  GLUCOSE 138*  BUN 15  CREATININE  1.65*  CALCIUM  9.3   GFR: CrCl cannot be calculated (Unknown ideal weight.). Liver Function Tests: Recent Labs  Lab 08/21/24 1520  AST 29  ALT 22  ALKPHOS 66  BILITOT 1.0  PROT 7.7  ALBUMIN 4.2   Recent Labs  Lab 08/21/24 1520  LIPASE 22   No results for input(s): AMMONIA in the last 168 hours. Coagulation Profile: No results for input(s): INR, PROTIME in the last 168 hours. Cardiac Enzymes: No results for input(s): CKTOTAL, CKMB, CKMBINDEX, TROPONINI in the last 168 hours. BNP (last 3 results) No results for input(s): PROBNP in the last 8760 hours. HbA1C: No results for input(s): HGBA1C in the last 72 hours. CBG: No results for input(s):  GLUCAP in the last 168 hours. Lipid Profile: No results for input(s): CHOL, HDL, LDLCALC, TRIG, CHOLHDL, LDLDIRECT in the last 72 hours. Thyroid  Function Tests: No results for input(s): TSH, T4TOTAL, FREET4, T3FREE, THYROIDAB in the last 72 hours. Anemia Panel: No results for input(s): VITAMINB12, FOLATE, FERRITIN, TIBC, IRON, RETICCTPCT in the last 72 hours. Urine analysis:    Component Value Date/Time   COLORURINE YELLOW 08/21/2024 1517   APPEARANCEUR CLEAR 08/21/2024 1517   LABSPEC 1.016 08/21/2024 1517   PHURINE 5.0 08/21/2024 1517   GLUCOSEU NEGATIVE 08/21/2024 1517   HGBUR SMALL (A) 08/21/2024 1517   BILIRUBINUR NEGATIVE 08/21/2024 1517   KETONESUR 5 (A) 08/21/2024 1517   PROTEINUR 30 (A) 08/21/2024 1517   UROBILINOGEN 0.2 02/08/2008 2009   NITRITE NEGATIVE 08/21/2024 1517   LEUKOCYTESUR NEGATIVE 08/21/2024 1517   Sepsis Labs: @LABRCNTIP (procalcitonin:4,lacticidven:4) )No results found for this or any previous visit (from the past 240 hours).   Radiological Exams on Admission: CT Renal Stone Study Result Date: 08/21/2024 EXAM: CT UROGRAM 08/21/2024 08:05:04 PM TECHNIQUE: CT of the abdomen and pelvis was performed without the administration of intravenous  contrast. Multiplanar reformatted images as well as MIP urogram images are provided for review. Automated exposure control, iterative reconstruction, and/or weight based adjustment of the mA/kV was utilized to reduce the radiation dose to as low as reasonably achievable. COMPARISON: None available. CLINICAL HISTORY: Abdominal/flank pain, stone suspected. FINDINGS: LOWER CHEST: No acute abnormality. LIVER: Intrahepatic biliary dilation greatest in the left hepatic lobe. Additional dilation of the common bile duct measuring up to 1.2 cm in diameter. This may be due to reservoir effect post cholecystectomy however correlation with LFTs is recommended. If abnormal consider MRCP or ERCP for further evaluation. GALLBLADDER AND BILE DUCTS: Cholecystectomy. SPLEEN: No acute abnormality. PANCREAS: No acute abnormality. ADRENAL GLANDS: No acute abnormality. KIDNEYS, URETERS AND BLADDER: Asymmetric left perinephric stranding. Moderate left hydroureteronephrosis with a 4 mm stone at the left UVJ. 1.3 cm hyperdensity in the right upper kidney ( hounsfield units 61 ) and 1.2 cm hyperdensity in the posterior right kidney ( Hounsfield units 57 ) are favored to represent proteinaceous or hemorrhagic cysts ; however a solid mass is not excluded. Urinary bladder is unremarkable. GI AND BOWEL: Postoperative change about the stomach. Diverticulosis of the descending and sigmoid colon. There is focal wall thickening and stranding about the distal descending colon with adjacent stranding and fluid compatible with diverticulitis. There is no bowel obstruction. PERITONEUM AND RETROPERITONEUM: No abscess. No free air. VASCULATURE: Aorta is normal in caliber. LYMPH NODES: No lymphadenopathy. REPRODUCTIVE ORGANS: No acute abnormality. BONES AND SOFT TISSUES: No acute osseous abnormality. No focal soft tissue abnormality. IMPRESSION: 1. Moderate left hydroureteronephrosis with a 4 mm stone at the left UVJ. 2. Acute uncomplicated diverticulitis  of the distal descending colon. Consider follow up colonoscopy after resolution of acute symptoms to exclude underlying mass. 3. Two right renal hyperdense lesions (1.3 cm and 1.2 cm, 61 and 57 HU), favored hemorrhagic/proteinaceous cysts but indeterminate; recommend renal protocol CT or MRI (preferred) for characterization. 4. Intrahepatic and common bile duct dilation (CBD up to 1.2 cm) likely due to reservoir effect post-cholecystectomy; correlate with LFTs and, if abnormal, consider MRCP or ERCP. Electronically signed by: Norman Gatlin MD 08/21/2024 08:18 PM EST RP Workstation: [id number]     Assessment/Plan Principal Problem:   Nausea & vomiting    Acute diverticulitis involving the distal colon.  Since patient is having nausea vomiting admitted for IV antibiotics.  Closely observe. Left-sided moderate  hydroureteronephrosis with 4 mm stone at the left UVJ.  Presently afebrile.  Closely observe.  Also has renal cyst will need follow-up as outpatient. Nausea vomiting likely from #1 and #2.  Closely observe.  Advance diet as tolerated. Acute on chronic kidney disease stage III creatinine mildly increased from baseline.  Did receive fluid bolus 500 cc in the ER.  Follow-up metabolic panel.  Hold off further fluids given the diastolic CHF history. Chronic HFpEF last EF measured was 55 to 60% in March 2024.  On ARB and beta-blockers.  May have to hold ARB if creatinine worsens.  Hold Lasix due to #3. Diabetes mellitus type II diet controlled.  Last hemoglobin A1c was 5.5 about a year ago. Hypertension on ARB and beta-blockers. History of nonobstructive CAD on statins.   DVT prophylaxis: Heparin. Code Status: Full code. Family Communication: Discussed with patient and family at the bedside. Disposition Plan: Medical floor. Consults called: None. Admission status: Observation.

## 2024-08-21 NOTE — ED Provider Notes (Signed)
 [facility] AT [facility] Provider Note   CSN: [id number] Arrival date & time: 08/21/24  1448     Patient presents with: Abdominal Pain   Wendy Downs is a 73 y.o. female.   Patient is a 73 year old female with past medical history of hypertension, diabetes, prior cholecystectomy, appendectomy and hysterectomy presenting to the emergency department with left-sided back pain and abdominal pain.  Patient states that she woke up this morning with pain.  She states that she has a history of IBS and initially thought this was IBS but has become more severe since.  She states that she has associated nausea and vomiting.  She states that she normally has diarrhea but has not had a bowel movement today.  Denies any dysuria or hematuria but does report decreased urination today since she has not been able to keep down any liquids.  Denies any recent trauma or heavy lifting.  The history is provided by the patient.  Abdominal Pain      Prior to Admission medications   Medication Sig Start Date End Date Taking? Authorizing Provider  albuterol  (PROVENTIL  HFA;VENTOLIN  HFA) 108 (90 Base) MCG/ACT inhaler TAKE 2 PUFFS BY MOUTH EVERY 4 HOURS AS NEEDED Patient taking differently: Inhale 1 puff into the lungs as needed for shortness of breath or wheezing. 01/27/18   Darlean Ozell NOVAK, MD  allopurinol (ZYLOPRIM) 100 MG tablet Take 200 mg by mouth daily as needed (gout).    [provider]  cetirizine (ZYRTEC) 10 MG tablet Take 10 mg by mouth daily as needed for allergies.    [provider]  cyanocobalamin  (,VITAMIN B-12,) 1000 MCG/ML injection Inject 1,000 mcg into the muscle every 30 (thirty) days.    [provider]  dorzolamide-timolol (COSOPT) 2-0.5 % ophthalmic solution Place 1 drop into both eyes 2 (two) times daily.    [provider]  latanoprost (XALATAN) 0.005 % ophthalmic solution Place 1 drop into both eyes at bedtime.     [provider]  Multiple Vitamins-Minerals (MULTIVITAMIN ADULTS PO) Take 1 Capful by mouth daily. Bariatric multivitamin    [provider]  Nebivolol  HCl 20 MG TABS Take 20 mg by mouth at bedtime.    [provider]  pantoprazole  (PROTONIX ) 40 MG tablet Take 80 mg by mouth daily with breakfast.    [provider]  potassium chloride  (KLOR-CON ) 10 MEQ tablet TAKE 1 TABLET BY MOUTH EVERY DAY 11/23/23   Jeffrie Oneil BROCKS, MD  prednisoLONE acetate (PRED FORTE) 1 % ophthalmic suspension Place 1 drop into the left eye in the morning and at bedtime. 09/12/18   [provider]  rosuvastatin  (CRESTOR ) 5 MG tablet TAKE 1 TABLET BY MOUTH EVERY DAY IN THE EVENING 11/19/23   Jeffrie Oneil BROCKS, MD  telmisartan  (MICARDIS ) 80 MG tablet Take 1 tablet (80 mg total) by mouth daily. 08/18/23   Akula, Vijaya, MD  triamcinolone cream (KENALOG) 0.5 % Apply 1 Application topically as needed (irritation). 08/03/23   [provider]  TURMERIC CURCUMIN PO Take 1 capsule by mouth daily.    [provider]    Allergies: Ezetimibe , Verapamil, Clonidine derivatives, Kiwi extract, Lactose intolerance (gi), Other, Prednisone , Valtrex [valacyclovir hcl], Vaseretic [enalapril-hydrochlorothiazide], Cholestyramine, and Codeine    Review of Systems  Gastrointestinal:  Positive for abdominal pain.    Updated Vital Signs BP (!) 144/75   Pulse 78   Temp 98.2 F (36.8 C) (Oral)   Resp 16   SpO2 95%  Physical Exam Vitals and nursing note reviewed.  Constitutional:      General: She is not in acute distress.    Appearance: She is well-developed.     Comments: Mildly uncomfortable appearing  HENT:     Head: Normocephalic and atraumatic.     Mouth/Throat:     Mouth: Mucous membranes are moist.  Eyes:     Extraocular Movements: Extraocular movements intact.  Cardiovascular:     Rate and Rhythm: Normal rate and regular rhythm.  Pulmonary:     Effort: Pulmonary effort  is normal.     Breath sounds: Normal breath sounds.  Abdominal:     General: Abdomen is flat.     Palpations: Abdomen is soft.     Tenderness: There is abdominal tenderness in the suprapubic area. There is no right CVA tenderness or left CVA tenderness (No reproducible tenderness, but points to L flank as area of pain).  Musculoskeletal:     Comments: No midline back tenderness, no reproducible back tenderness  Skin:    General: Skin is warm and dry.  Neurological:     General: No focal deficit present.     Mental Status: She is alert and oriented to person, place, and time.  Psychiatric:        Mood and Affect: Mood normal.        Behavior: Behavior normal.     (all labs ordered are listed, but only abnormal results are displayed) Labs Reviewed  COMPREHENSIVE METABOLIC PANEL WITH GFR - Abnormal; Notable for the following components:      Result Value   Glucose, Bld 138 (*)    Creatinine, Ser 1.65 (*)    GFR, Estimated 32 (*)    All other components within normal limits  URINALYSIS, ROUTINE W REFLEX MICROSCOPIC - Abnormal; Notable for the following components:   Hgb urine dipstick SMALL (*)    Ketones, ur 5 (*)    Protein, ur 30 (*)    All other components within normal limits  LIPASE, BLOOD  CBC WITH DIFFERENTIAL/PLATELET    EKG: None  Radiology: CT Renal Stone Study Result Date: 08/21/2024 EXAM: CT UROGRAM 08/21/2024 08:05:04 PM TECHNIQUE: CT of the abdomen and pelvis was performed without the administration of intravenous contrast. Multiplanar reformatted images as well as MIP urogram images are provided for review. Automated exposure control, iterative reconstruction, and/or weight based adjustment of the mA/kV was utilized to reduce the radiation dose to as low as reasonably achievable. COMPARISON: None available. CLINICAL HISTORY: Abdominal/flank pain, stone suspected. FINDINGS: LOWER CHEST: No acute abnormality. LIVER: Intrahepatic biliary dilation greatest in the  left hepatic lobe. Additional dilation of the common bile duct measuring up to 1.2 cm in diameter. This may be due to reservoir effect post cholecystectomy however correlation with LFTs is recommended. If abnormal consider MRCP or ERCP for further evaluation. GALLBLADDER AND BILE DUCTS: Cholecystectomy. SPLEEN: No acute abnormality. PANCREAS: No acute abnormality. ADRENAL GLANDS: No acute abnormality. KIDNEYS, URETERS AND BLADDER: Asymmetric left perinephric stranding. Moderate left hydroureteronephrosis with a 4 mm stone at the left UVJ. 1.3 cm hyperdensity in the right upper kidney ( hounsfield units 61 ) and 1.2 cm hyperdensity in the posterior right kidney ( Hounsfield units 57 ) are favored to represent proteinaceous or hemorrhagic cysts ; however a solid mass is not excluded. Urinary bladder is unremarkable. GI AND BOWEL: Postoperative change about the stomach. Diverticulosis of the descending and sigmoid colon. There is focal wall thickening and stranding about the distal descending  colon with adjacent stranding and fluid compatible with diverticulitis. There is no bowel obstruction. PERITONEUM AND RETROPERITONEUM: No abscess. No free air. VASCULATURE: Aorta is normal in caliber. LYMPH NODES: No lymphadenopathy. REPRODUCTIVE ORGANS: No acute abnormality. BONES AND SOFT TISSUES: No acute osseous abnormality. No focal soft tissue abnormality. IMPRESSION: 1. Moderate left hydroureteronephrosis with a 4 mm stone at the left UVJ. 2. Acute uncomplicated diverticulitis of the distal descending colon. Consider follow up colonoscopy after resolution of acute symptoms to exclude underlying mass. 3. Two right renal hyperdense lesions (1.3 cm and 1.2 cm, 61 and 57 HU), favored hemorrhagic/proteinaceous cysts but indeterminate; recommend renal protocol CT or MRI (preferred) for characterization. 4. Intrahepatic and common bile duct dilation (CBD up to 1.2 cm) likely due to reservoir effect post-cholecystectomy; correlate  with LFTs and, if abnormal, consider MRCP or ERCP. Electronically signed by: Norman Gatlin MD 08/21/2024 08:18 PM EST RP Workstation: [id number]     Procedures   Medications Ordered in the ED  fentaNYL (SUBLIMAZE) injection 50 mcg (has no administration in time range)  ondansetron (ZOFRAN) injection 4 mg (has no administration in time range)  cefTRIAXone (ROCEPHIN) 2 g in sodium chloride  0.9 % 100 mL IVPB (has no administration in time range)    And  metroNIDAZOLE (FLAGYL) IVPB 500 mg (has no administration in time range)  sodium chloride  0.9 % bolus 500 mL (0 mLs Intravenous Stopped 08/21/24 1919)  ondansetron (ZOFRAN) injection 4 mg (4 mg Intravenous Given 08/21/24 1835)  fentaNYL (SUBLIMAZE) injection 50 mcg (50 mcg Intravenous Given 08/21/24 1837)  ondansetron (ZOFRAN) injection 4 mg (4 mg Intravenous Given 08/21/24 2132)  oxyCODONE-acetaminophen  (PERCOCET/ROXICET) 5-325 MG per tablet 1 tablet (1 tablet Oral Given 08/21/24 2151)  amoxicillin-clavulanate (AUGMENTIN) 875-125 MG per tablet 1 tablet (1 tablet Oral Given 08/21/24 2151)    Clinical Course as of 08/21/24 2231  Mon Aug 21, 2024  2229 CT imaging with diverticulitis and L UVJ stone. Patient unable to tolerate PO. Will be admitted for pain and nausea control. [VK]    Clinical Course User Index [VK] Kingsley, Lakaya Tolen K, DO                                 Medical Decision Making This patient presents to the ED with chief complaint(s) of Abd pain, flank pain with pertinent past medical history of HTN, DM, multiple prior abd surgeries which further complicates the presenting complaint. The complaint involves an extensive differential diagnosis and also carries with it a high risk of complications and morbidity.    The differential diagnosis includes dehydration, electrolyte abnormality, gastroenteritis, constipation, pyelonephritis, nephrolithiasis, muscle strain or spasm  Additional history obtained: Additional history  obtained from family Records reviewed N/A  ED Course and Reassessment: On patient's arrival she is hemodynamically stable in no acute distress.  Had labs initiated in triage.  Creatinine is mildly increased from prior labs 1 year ago and otherwise within normal range.  Urine is pending at this time.  Will additionally have CT renal stone study to evaluate for etiology of her pain.  Patient was given pain and nausea control and will be closely reassessed.  Independent labs interpretation:  The following labs were independently interpreted: mildly increased Cr from baseline  Independent visualization of imaging: - I independently visualized the following imaging with scope of interpretation limited to determining acute life threatening conditions related to emergency care: CTAP, which revealed L UVJ stone, diverticulitis  Consultation: -  Consulted or discussed management/test interpretation w/ external professional: hospitalist  Consideration for admission or further workup: patient requires admission for pain and nausea control Social Determinants of health: N/A    Amount and/or Complexity of Data Reviewed Labs: ordered. Radiology: ordered.  Risk Prescription drug management. Decision regarding hospitalization.       Final diagnoses:  Nephrolithiasis  Diverticulitis  Renal lesion    ED Discharge Orders     None          Kingsley, Carollynn Pennywell K, DO 08/21/24 2231

## 2024-08-21 NOTE — ED Notes (Signed)
 Attempted to go in and update patient vitals. Will return due to patient receiving ultrasound IV.

## 2024-08-21 NOTE — ED Notes (Signed)
Patient was given urine cup

## 2024-08-22 DIAGNOSIS — E119 Type 2 diabetes mellitus without complications: Secondary | ICD-10-CM | POA: Diagnosis not present

## 2024-08-22 DIAGNOSIS — K5792 Diverticulitis of intestine, part unspecified, without perforation or abscess without bleeding: Secondary | ICD-10-CM | POA: Diagnosis not present

## 2024-08-22 DIAGNOSIS — N179 Acute kidney failure, unspecified: Secondary | ICD-10-CM | POA: Diagnosis not present

## 2024-08-22 DIAGNOSIS — N1831 Chronic kidney disease, stage 3a: Secondary | ICD-10-CM

## 2024-08-22 LAB — GLUCOSE, CAPILLARY
Glucose-Capillary: 102 mg/dL — ABNORMAL HIGH (ref 70–99)
Glucose-Capillary: 111 mg/dL — ABNORMAL HIGH (ref 70–99)
Glucose-Capillary: 130 mg/dL — ABNORMAL HIGH (ref 70–99)
Glucose-Capillary: 97 mg/dL (ref 70–99)
Glucose-Capillary: 98 mg/dL (ref 70–99)

## 2024-08-22 LAB — CBC
HCT: 32.8 % — ABNORMAL LOW (ref 36.0–46.0)
Hemoglobin: 10.6 g/dL — ABNORMAL LOW (ref 12.0–15.0)
MCH: 31.3 pg (ref 26.0–34.0)
MCHC: 32.3 g/dL (ref 30.0–36.0)
MCV: 96.8 fL (ref 80.0–100.0)
Platelets: 234 K/uL (ref 150–400)
RBC: 3.39 MIL/uL — ABNORMAL LOW (ref 3.87–5.11)
RDW: 13 % (ref 11.5–15.5)
WBC: 7.7 K/uL (ref 4.0–10.5)
nRBC: 0 % (ref 0.0–0.2)

## 2024-08-22 LAB — BASIC METABOLIC PANEL WITH GFR
Anion gap: 11 (ref 5–15)
BUN: 16 mg/dL (ref 8–23)
CO2: 22 mmol/L (ref 22–32)
Calcium: 8.8 mg/dL — ABNORMAL LOW (ref 8.9–10.3)
Chloride: 109 mmol/L (ref 98–111)
Creatinine, Ser: 1.75 mg/dL — ABNORMAL HIGH (ref 0.44–1.00)
GFR, Estimated: 30 mL/min — ABNORMAL LOW (ref >60)
Glucose, Bld: 112 mg/dL — ABNORMAL HIGH (ref 70–99)
Potassium: 3.4 mmol/L — ABNORMAL LOW (ref 3.5–5.1)
Sodium: 142 mmol/L (ref 135–145)

## 2024-08-22 LAB — HEMOGLOBIN A1C
Hgb A1c MFr Bld: 5.7 % — ABNORMAL HIGH (ref 4.8–5.6)
Mean Plasma Glucose: 116.89 mg/dL

## 2024-08-22 MED ORDER — LACTATED RINGERS IV SOLN: INTRAVENOUS | Status: DC

## 2024-08-22 MED ORDER — POTASSIUM CHLORIDE CRYS ER 20 MEQ PO TBCR: 40.0000 meq | EXTENDED_RELEASE_TABLET | Freq: Once | ORAL | Status: DC

## 2024-08-22 MED ORDER — PROCHLORPERAZINE EDISYLATE 10 MG/2ML IJ SOLN
10.0000 mg | Freq: Four times a day (QID) | INTRAMUSCULAR | Status: DC | PRN
  Administered 2024-08-22: 10 mg via INTRAVENOUS
  Filled 2024-08-22: qty 2

## 2024-08-22 MED ORDER — OXYCODONE HCL 5 MG PO TABS
5.0000 mg | ORAL_TABLET | Freq: Four times a day (QID) | ORAL | Status: DC | PRN
Start: 2024-08-22 — End: 2024-08-23

## 2024-08-22 MED ORDER — TAMSULOSIN HCL 0.4 MG PO CAPS
0.4000 mg | ORAL_CAPSULE | Freq: Every day | ORAL | Status: DC
  Administered 2024-08-22 – 2024-08-23 (×2): 0.4 mg via ORAL
  Filled 2024-08-22 (×2): qty 1

## 2024-08-22 MED ORDER — ONDANSETRON HCL 4 MG/2ML IJ SOLN
4.0000 mg | Freq: Four times a day (QID) | INTRAMUSCULAR | Status: DC | PRN
  Administered 2024-08-22: 4 mg via INTRAVENOUS
  Filled 2024-08-22: qty 2

## 2024-08-22 NOTE — Consult Note (Signed)
 Urology Consult Note   Requesting Attending Physician:  Jadine Toribio SQUIBB, MD Service Providing Consult: Urology  Consulting Attending: Dr. Cam   Reason for Consult:  4mm left UVJ stone  HPI: Wendy Downs is seen in consultation for reasons noted above at the request of Jadine Toribio SQUIBB, MD. Patient is a 73 y.o. female presenting to [facility] emergency department with N/V and left flank pain.  PMH significant for chronic diastolic CHF, nonobstructive CAD, T2DM, CKDIII, and HTN.  CT A/P in emergency department noted 4 mm obstructing left ureteral stone at the UVJ with associated hydronephrosis.  She was also diagnosed with acute diverticulitis and has been admitted for IV ABX and observation.  My arrival patient was alert, oriented, and in no distress.  She was pleasant and having breakfast.  This is her first kidney stone though there is a familial history.  We reviewed the anatomy and recommended options.  All questions were answered to her satisfaction.  ------------------  Assessment:   73 y.o. female with left UVJ stone with associated AoCKD and hydronephrosis   Recommendations: # Left Ureteral Stone # AoCKD  Trend labs.  SCr 1.75, baseline around 1.20.  Vomiting throughout the day yesterday.  Multifactorial-obstructive uropathy and volume depletion.  Patient tolerating oral intake and encouraged to increase water consumption.  Continue with medical expulsive therapy.  4 mm stone has a high statistical probability of passing without surgical intervention.  Additionally this is right at the ureteral orifice.  Patient is not voided yet this morning but has no complaints of pain and she may have passed the stone into her bladder already.  Not NPO at this time. Please hold diet if she fails to progress.   Daily Flomax  Strain all urine  If all other metrics for discharge are met, patient is free to continue with medical expulsive therapy at home and to follow-up in  clinic.  Please provide strainer and specimen cup if stone has not been collected.    Urology will follow peripherally  Case and plan discussed with Dr. Cam  Past Medical History: Past Medical History:  Diagnosis Date   Diabetes mellitus without complication (HCC)    Gout    Hyperlipidemia    Hypertension    IBS (irritable bowel syndrome)    Morbid obesity (HCC)    Shingles     Past Surgical History:  Past Surgical History:  Procedure Laterality Date   ABDOMINAL HYSTERECTOMY     APPENDECTOMY     CARPAL TUNNEL RELEASE     CHOLECYSTECTOMY      Medication: Current Facility-Administered Medications  Medication Dose Route Frequency Provider Last Rate Last Admin   acetaminophen  (TYLENOL ) tablet 650 mg  650 mg Oral Q6H PRN Franky Redia SAILOR, MD       Or   acetaminophen  (TYLENOL ) suppository 650 mg  650 mg Rectal Q6H PRN Franky Redia SAILOR, MD       cefTRIAXone (ROCEPHIN) 2 g in sodium chloride  0.9 % 100 mL IVPB  2 g Intravenous Q24H Kakrakandy, Arshad N, MD       dorzolamide-timolol (COSOPT) 2-0.5 % ophthalmic solution 1 drop  1 drop Both Eyes BID Franky Redia SAILOR, MD   1 drop at 08/22/24 0054   heparin injection 5,000 Units  5,000 Units Subcutaneous Q8H Kakrakandy, Arshad N, MD   5,000 Units at 08/22/24 0555   insulin aspart (novoLOG) injection 0-6 Units  0-6 Units Subcutaneous TID WC Franky Redia SAILOR, MD  irbesartan (AVAPRO) tablet 300 mg  300 mg Oral Daily Kakrakandy, Arshad N, MD       latanoprost (XALATAN) 0.005 % ophthalmic solution 1 drop  1 drop Both Eyes QHS Kakrakandy, Arshad N, MD   1 drop at 08/22/24 0054   metroNIDAZOLE (FLAGYL) IVPB 500 mg  500 mg Intravenous Q12H Franky Redia SAILOR, MD       nebivolol  (BYSTOLIC ) tablet 20 mg  20 mg Oral QHS Kakrakandy, Arshad N, MD   20 mg at 08/22/24 0051   oxyCODONE-acetaminophen  (PERCOCET/ROXICET) 5-325 MG per tablet 1 tablet  1 tablet Oral Once Kingsley, Victoria K, DO       rosuvastatin  (CRESTOR ) tablet 5  mg  5 mg Oral QHS Kakrakandy, Arshad N, MD   5 mg at 08/22/24 0051    Allergies: Allergies  Allergen Reactions   Ezetimibe  Other (See Comments)    Migraine aura's   Verapamil Other (See Comments)    Swelling in legs   Clonidine Derivatives Other (See Comments)    Ear Pain, Dizziness, Insomnia, migraine, constipation. Patient stated that medicine kept her off balance.   Kiwi Extract    Lactose Intolerance (Gi) Other (See Comments)    Suspected. Intolerance to milk, yogurt and ice cream.    Other     Sugar, artificial sweeteners    Prednisone  Other (See Comments)    Mood swings   Valtrex [Valacyclovir Hcl] Other (See Comments)    Maybe stomach pains   Vaseretic [Enalapril-Hydrochlorothiazide] Other (See Comments)    Gout    Cholestyramine Anxiety, Other (See Comments) and Swelling   Codeine Other (See Comments)    Pain in stomach    Social History: Social History   Tobacco Use   Smoking status: Never   Smokeless tobacco: Never  Vaping Use   Vaping status: Never Used  Substance Use Topics   Alcohol use: No    Alcohol/week: 0.0 standard drinks of alcohol   Drug use: No    Family History Family History  Problem Relation Age of Onset   Heart attack Mother    Hypertension Mother    Stroke Father    Heart disease Sister    Stroke Sister    Heart disease Brother    Stroke Brother    Stroke Sister    Aneurysm Brother     Review of Systems  Genitourinary:  Negative for dysuria, flank pain, frequency, hematuria and urgency.     Objective   Vital signs in last 24 hours: BP (!) 147/78 (BP Location: Right Arm)   Pulse 65   Temp 98.3 F (36.8 C) (Oral)   Resp 19   SpO2 100%   Physical Exam General: A&O, resting, appropriate HEENT: [place]/AT Pulmonary: Normal work of breathing Cardiovascular: no cyanosis     Most Recent Labs: Lab Results  Component Value Date   WBC 7.7 08/22/2024   HGB 10.6 (L) 08/22/2024   HCT 32.8 (L) 08/22/2024   PLT 234 08/22/2024     Lab Results  Component Value Date   NA 142 08/22/2024   K 3.4 (L) 08/22/2024   CL 109 08/22/2024   CO2 22 08/22/2024   BUN 16 08/22/2024   CREATININE 1.75 (H) 08/22/2024   CALCIUM  8.8 (L) 08/22/2024    Lab Results  Component Value Date   INR 1.0 09/22/2019   APTT 27 09/22/2019     Urine Culture: @[id number] (laburin,org,[id number],[id number])@   IMAGING: CT Renal Stone Study Result Date: 08/21/2024 EXAM: CT UROGRAM 08/21/2024 08:05:04 PM TECHNIQUE:  CT of the abdomen and pelvis was performed without the administration of intravenous contrast. Multiplanar reformatted images as well as MIP urogram images are provided for review. Automated exposure control, iterative reconstruction, and/or weight based adjustment of the mA/kV was utilized to reduce the radiation dose to as low as reasonably achievable. COMPARISON: None available. CLINICAL HISTORY: Abdominal/flank pain, stone suspected. FINDINGS: LOWER CHEST: No acute abnormality. LIVER: Intrahepatic biliary dilation greatest in the left hepatic lobe. Additional dilation of the common bile duct measuring up to 1.2 cm in diameter. This may be due to reservoir effect post cholecystectomy however correlation with LFTs is recommended. If abnormal consider MRCP or ERCP for further evaluation. GALLBLADDER AND BILE DUCTS: Cholecystectomy. SPLEEN: No acute abnormality. PANCREAS: No acute abnormality. ADRENAL GLANDS: No acute abnormality. KIDNEYS, URETERS AND BLADDER: Asymmetric left perinephric stranding. Moderate left hydroureteronephrosis with a 4 mm stone at the left UVJ. 1.3 cm hyperdensity in the right upper kidney ( hounsfield units 61 ) and 1.2 cm hyperdensity in the posterior right kidney ( Hounsfield units 57 ) are favored to represent proteinaceous or hemorrhagic cysts ; however a solid mass is not excluded. Urinary bladder is unremarkable. GI AND BOWEL: Postoperative change about the stomach. Diverticulosis of the descending and sigmoid colon. There  is focal wall thickening and stranding about the distal descending colon with adjacent stranding and fluid compatible with diverticulitis. There is no bowel obstruction. PERITONEUM AND RETROPERITONEUM: No abscess. No free air. VASCULATURE: Aorta is normal in caliber. LYMPH NODES: No lymphadenopathy. REPRODUCTIVE ORGANS: No acute abnormality. BONES AND SOFT TISSUES: No acute osseous abnormality. No focal soft tissue abnormality. IMPRESSION: 1. Moderate left hydroureteronephrosis with a 4 mm stone at the left UVJ. 2. Acute uncomplicated diverticulitis of the distal descending colon. Consider follow up colonoscopy after resolution of acute symptoms to exclude underlying mass. 3. Two right renal hyperdense lesions (1.3 cm and 1.2 cm, 61 and 57 HU), favored hemorrhagic/proteinaceous cysts but indeterminate; recommend renal protocol CT or MRI (preferred) for characterization. 4. Intrahepatic and common bile duct dilation (CBD up to 1.2 cm) likely due to reservoir effect post-cholecystectomy; correlate with LFTs and, if abnormal, consider MRCP or ERCP. Electronically signed by: Norman Gatlin MD 08/21/2024 08:18 PM EST RP Workstation: [id number]    ------  Ole Bourdon, NP Pager: [phone]   Please contact the urology consult pager with any further questions/concerns.

## 2024-08-22 NOTE — Hospital Course (Addendum)
 73 year old woman PMH including CHF, diabetes, CKD presented with left flank pain.  Admitted for uncomplicated diverticulitis, moderate hydronephrosis on the left secondary to UVJ stone.  Consultants Urology   Procedures/Events

## 2024-08-22 NOTE — TOC Initial Note (Signed)
 Transition of Care [facility]) - Initial/Assessment Note    Patient Details  Name: Wendy Downs MRN: [id number] Date of Birth: [birth year]  [facility]) CM/SW Contact:    Bascom Service, RN Phone Number: 08/22/2024, 11:45 AM  Clinical Narrative:   d/c home. Has own transport home.                Expected Discharge Plan: Home/Self Care Barriers to Discharge: Continued Medical Work up   Patient Goals and CMS Choice Patient states their goals for this hospitalization and ongoing recovery are:: Home CMS Medicare.gov Compare Post Acute Care list provided to:: Patient Choice offered to / list presented to : Patient [place] ownership interest in [facility].provided to:: Patient    Expected Discharge Plan and Services   Discharge Planning Services: CM Consult Post Acute Care Choice: Resumption of Svcs/PTA Provider Living arrangements for the past 2 months: Single Family Home                                      Prior Living Arrangements/Services Living arrangements for the past 2 months: Single Family Home Lives with:: Self                   Activities of Daily Living   ADL Screening (condition at time of admission) Independently performs ADLs?: Yes (appropriate for developmental age) Is the patient deaf or have difficulty hearing?: No Does the patient have difficulty seeing, even when wearing glasses/contacts?: Yes Does the patient have difficulty concentrating, remembering, or making decisions?: No  Permission Sought/Granted                  Emotional Assessment              Admission diagnosis:  Diverticulitis [K57.92] Nephrolithiasis [N20.0] Nausea & vomiting [R11.2] Renal lesion [N28.9] Patient Active Problem List   Diagnosis Date Noted   Nausea & vomiting 08/21/2024   CKD (chronic kidney disease) stage 3, GFR 30-59 ml/min (HCC) 08/21/2024   Acute diverticulitis 08/21/2024   Chronic heart failure with preserved  ejection fraction (HFpEF) (HCC) 08/21/2024   Ureteric stone 08/21/2024   Hypotension 08/13/2023   Acute kidney injury superimposed on stage 3a chronic kidney disease (HCC) 08/13/2023   Normocytic anemia 08/13/2023   Diabetes mellitus with hypoglycemia (HCC) 08/13/2023   TIA (transient ischemic attack) 09/22/2019   Hyperlipidemia    Gout    Diabetes mellitus without complication (HCC)    Dyspnea on exertion 03/18/2015   PULMONARY SARCOIDOSIS 02/20/2010   HEADACHE, CHRONIC 02/20/2010   HYPERCHOLESTEROLEMIA 02/19/2010   Morbid obesity due to excess calories (HCC) 02/19/2010   ANEMIA, IRON DEFICIENCY 02/19/2010   ANEMIA, PERNICIOUS 02/19/2010   ANEMIA, B12 DEFICIENCY 02/19/2010   ANXIETY 02/19/2010   GLAUCOMA 02/19/2010   Hypertension 02/19/2010   ALLERGIC RHINITIS 02/19/2010   GASTROESOPHAGEAL REFLUX DISEASE 02/19/2010   PCP:  Claudene Pellet, MD Pharmacy:   [id number] [place] - [address] [place] [address] Phone: [phone] Fax: [phone]  [facility] [place] - [facility] [address] [place] [address] Phone: [phone] Fax: [phone]     Social Drivers of Health (SDOH) Social History: SDOH Screenings   Food Insecurity: No Food Insecurity (08/22/2024)  Housing: Low Risk  (08/22/2024)  Transportation Needs: No Transportation Needs (08/22/2024)  Utilities: Not At Risk (08/22/2024)  Depression (PHQ2-9): Low Risk  (  09/06/2021)  Social Connections: Moderately Integrated (08/22/2024)  Tobacco Use: Low Risk  (08/21/2024)   SDOH Interventions:     Readmission Risk Interventions     No data to display

## 2024-08-22 NOTE — Care Management Obs Status (Signed)
 MEDICARE OBSERVATION STATUS NOTIFICATION   Patient Details  Name: Wendy Downs MRN: [id number] Date of Birth: October 05, 1951   Medicare Observation Status Notification Given:  Yes    MahabirNathanel, RN 08/22/2024, 11:44 AM

## 2024-08-22 NOTE — TOC Initial Note (Signed)
 Transition of Care [facility]) - Initial/Assessment Note    Patient Details  Name: Wendy Downs MRN: [id number] Date of Birth: May 02, 1951  [facility]) CM/SW Contact:    Bascom Service, RN Phone Number: 08/22/2024, 9:47 AM  Clinical Narrative: d/c home. Has own transport home.                  Expected Discharge Plan: Home/Self Care Barriers to Discharge: Continued Medical Work up   Patient Goals and CMS Choice Patient states their goals for this hospitalization and ongoing recovery are:: Home CMS Medicare.gov Compare Post Acute Care list provided to:: Patient Choice offered to / list presented to : Patient [place] ownership interest in [facility].provided to:: Patient    Expected Discharge Plan and Services       Living arrangements for the past 2 months: Single Family Home                                      Prior Living Arrangements/Services Living arrangements for the past 2 months: Single Family Home Lives with:: Self                   Activities of Daily Living   ADL Screening (condition at time of admission) Independently performs ADLs?: Yes (appropriate for developmental age) Is the patient deaf or have difficulty hearing?: No Does the patient have difficulty seeing, even when wearing glasses/contacts?: Yes Does the patient have difficulty concentrating, remembering, or making decisions?: No  Permission Sought/Granted                  Emotional Assessment              Admission diagnosis:  Diverticulitis [K57.92] Nephrolithiasis [N20.0] Nausea & vomiting [R11.2] Renal lesion [N28.9] Patient Active Problem List   Diagnosis Date Noted   Nausea & vomiting 08/21/2024   CKD (chronic kidney disease) stage 3, GFR 30-59 ml/min (HCC) 08/21/2024   Acute diverticulitis 08/21/2024   Chronic heart failure with preserved ejection fraction (HFpEF) (HCC) 08/21/2024   Ureteric stone 08/21/2024   Hypotension  08/13/2023   Acute kidney injury superimposed on stage 3a chronic kidney disease (HCC) 08/13/2023   Normocytic anemia 08/13/2023   Diabetes mellitus with hypoglycemia (HCC) 08/13/2023   TIA (transient ischemic attack) 09/22/2019   Hyperlipidemia    Gout    Diabetes mellitus without complication (HCC)    Dyspnea on exertion 03/18/2015   PULMONARY SARCOIDOSIS 02/20/2010   HEADACHE, CHRONIC 02/20/2010   HYPERCHOLESTEROLEMIA 02/19/2010   Morbid obesity due to excess calories (HCC) 02/19/2010   ANEMIA, IRON DEFICIENCY 02/19/2010   ANEMIA, PERNICIOUS 02/19/2010   ANEMIA, B12 DEFICIENCY 02/19/2010   ANXIETY 02/19/2010   GLAUCOMA 02/19/2010   Hypertension 02/19/2010   ALLERGIC RHINITIS 02/19/2010   GASTROESOPHAGEAL REFLUX DISEASE 02/19/2010   PCP:  Claudene Pellet, MD Pharmacy:   [id number] - [place], [place] - [address] [place] [address] Phone: [phone] Fax: [phone]  [facility] [place] - [facility] [address] [place] [address] Phone: [phone] Fax: [phone]     Social Drivers of Health (SDOH) Social History: SDOH Screenings   Food Insecurity: No Food Insecurity (08/22/2024)  Housing: Low Risk  (08/22/2024)  Transportation Needs: No Transportation Needs (08/22/2024)  Utilities: Not At Risk (08/22/2024)  Depression (PHQ2-9): Low Risk  (09/06/2021)  Social Connections: Moderately Integrated (08/22/2024)  Tobacco  Use: Low Risk  (08/21/2024)   SDOH Interventions:     Readmission Risk Interventions     No data to display

## 2024-08-22 NOTE — Progress Notes (Signed)
 G   Progress Note   Patient: Wendy Downs FMW:[id number] DOB: 01-09-1951 DOA: 08/21/2024     0 DOS: the patient was seen and examined on 08/22/2024   Brief hospital course: 73 year old woman PMH including CHF, diabetes, CKD presented with left flank pain.  Admitted for uncomplicated diverticulitis, moderate hydronephrosis on the left secondary to UVJ stone.  Consultants Urology   Procedures/Events   Assessment and Plan: Uncomplicated acute diverticulitis descending colon Still has left lower quadrant pain.  Monitor on diet.  Pain control.  Recommend colonoscopy as an outpatient after resolution of acute infection  Moderate left hydroureteronephrosis 4 mm nephrolithiasis left UVJ CT abdomen pelvis showed moderate left hydro Seen by urology, continue Flomax, strain all urine, follow-up as an outpatient  AKI CKD stage IIIa Baseline creatinine around 1.2.  Currently at 1.75. IV fluids, check BMP in AM.  If remains elevated will get renal ultrasound and may need to [facility] urology.  Chronic diastolic CHF Holding ARB  Diabetes mellitus type 2 diet controlled Hemoglobin A1c 5.7 Sliding scale insulin.  CBG stable.  2 right renal lesions Seen on CT.  Favored to be cysts but recommend outpatient renal MRI    Subjective:  Feels somewhat better but still has left lower quadrant pain.  No nausea or vomiting.  Physical Exam: Vitals:   08/22/24 0520 08/22/24 0850 08/22/24 0919 08/22/24 1410  BP: (!) 147/78 (!) 152/77  138/77  Pulse: 65 60  (!) 59  Resp: 19   20  Temp: 98.3 F (36.8 C)   98.7 F (37.1 C)  TempSrc: Oral   Oral  SpO2: 100% 100%  100%  Weight:   93.8 kg   Height:   5' 4 (1.626 m)    Physical Exam Vitals reviewed.  Constitutional:      General: She is not in acute distress.    Appearance: She is ill-appearing. She is not toxic-appearing.  Cardiovascular:     Rate and Rhythm: Normal rate and regular rhythm.     Heart sounds: No murmur  heard. Pulmonary:     Effort: Pulmonary effort is normal. No respiratory distress.     Breath sounds: No wheezing or rhonchi.  Abdominal:     Tenderness: There is abdominal tenderness (LLQ).  Musculoskeletal:     Right lower leg: No edema.     Left lower leg: No edema.  Neurological:     Mental Status: She is alert.  Psychiatric:        Mood and Affect: Mood normal.        Behavior: Behavior normal.     Data Reviewed: Potassium 3.4 Creatinine slightly higher at 1.75 Hemoglobin down to 10.6 after hydration Hemoglobin A1c 5.7  Family Communication: none   Disposition: Status is: Observation      Time spent: 35 minutes  Author: Toribio Door, MD 08/22/2024 2:12 PM  For on call review [email or web address].

## 2024-08-23 DIAGNOSIS — K5792 Diverticulitis of intestine, part unspecified, without perforation or abscess without bleeding: Secondary | ICD-10-CM | POA: Diagnosis not present

## 2024-08-23 LAB — BASIC METABOLIC PANEL WITH GFR
Anion gap: 10 (ref 5–15)
BUN: 17 mg/dL (ref 8–23)
CO2: 24 mmol/L (ref 22–32)
Calcium: 8.7 mg/dL — ABNORMAL LOW (ref 8.9–10.3)
Chloride: 106 mmol/L (ref 98–111)
Creatinine, Ser: 1.91 mg/dL — ABNORMAL HIGH (ref 0.44–1.00)
GFR, Estimated: 27 mL/min — ABNORMAL LOW (ref >60)
Glucose, Bld: 113 mg/dL — ABNORMAL HIGH (ref 70–99)
Potassium: 3.2 mmol/L — ABNORMAL LOW (ref 3.5–5.1)
Sodium: 140 mmol/L (ref 135–145)

## 2024-08-23 LAB — GLUCOSE, CAPILLARY: Glucose-Capillary: 93 mg/dL (ref 70–99)

## 2024-08-23 MED ORDER — ONDANSETRON HCL 4 MG PO TABS: 4.0000 mg | ORAL_TABLET | Freq: Three times a day (TID) | ORAL | 0 refills | Status: AC | PRN

## 2024-08-23 MED ORDER — TRAMADOL HCL 50 MG PO TABS: 50.0000 mg | ORAL_TABLET | Freq: Four times a day (QID) | ORAL | 0 refills | Status: AC | PRN

## 2024-08-23 MED ORDER — SODIUM CHLORIDE 0.9 % IV SOLN: INTRAVENOUS | Status: DC

## 2024-08-23 MED ORDER — TAMSULOSIN HCL 0.4 MG PO CAPS: 0.4000 mg | ORAL_CAPSULE | Freq: Every day | ORAL | 0 refills | Status: AC

## 2024-08-23 MED ORDER — AMOXICILLIN-POT CLAVULANATE 875-125 MG PO TABS: 1.0000 | ORAL_TABLET | Freq: Two times a day (BID) | ORAL | 0 refills | Status: AC

## 2024-08-23 NOTE — Discharge Summary (Signed)
 Physician Discharge Summary  Wendy Downs FMW:[id number] DOB: 02-13-1951 DOA: 08/21/2024  PCP: Claudene Pellet, MD  Admit date: 08/21/2024 Discharge date: 08/23/2024  Admitted From: Home Disposition: Home  Recommendations for Outpatient Follow-up:  Follow up with PCP in 1 week with repeat CBC/BMP Outpatient follow-up with urology/Dr. Cam next week Follow up in ED if symptoms worsen or new appear   Home Health: No Equipment/Devices: None  Discharge Condition: Stable CODE STATUS: Full Diet recommendation: Heart healthy  Brief/Interim Summary: 73 year old female with history of chronic diastolic CHF, diabetes mellitus type 2, CKD stage IIIa presented with left flank pain. Admitted for uncomplicated diverticulitis, moderate hydronephrosis on the left secondary to UVJ stone. She was started on IV fluids, antibiotics. Urology was consulted.  During the hospitalization, her condition has improved.  Her abdominal pain is improving.  Creatinine is slightly worsened today but urology has cleared the patient for discharge home today and recommended outpatient follow-up with urology next week.  She feels well to go home today.  She will be discharged home on oral Augmentin today.  Discharge Diagnoses:   Acute uncomplicated diverticulitis of descending colon - Currently on Rocephin and Flagyl.  Abdominal pain has much improved and patient is tolerating diet. She feels well to go home today.  She will be discharged home on oral Augmentin today.  She will need outpatient follow-up with GI as an outpatient for need for possible colonoscopy.   Moderate left hydroureteronephrosis due to left UVJ stone - Urology recommended Flomax, strain all urine and follow-up with urology as an outpatient.  Dr. Herrick/urology communicated with me via secure chat today and recommended that patient be discharged home today despite slight worsening in kidney function.  Kidney function will be checked at urology  office next week.  Outpatient follow-up with urology.    AKI on CKD stage IIIa - Baseline creatinine of around 1.2.  Creatinine worsening to 1.91 today.  Treated with IV fluids.  Plan as above.  Lasix and telmisartan  will remain on hold till reevaluation by PCP.  Hypokalemia -Replace.  Continue replacement as an outpatient.  Outpatient follow-up   chronic diastolic CHF Hypertension Hyperlipidemia - Stable.  ARB on hold.  Continue Nebivolol  and statin.  Outpatient follow-up with PCP and/or cardiology   Diabetes mellitus type 2, diet controlled -A1c 5.7.  Carb modified diet.  Outpatient follow-up.  2 right renal lesions - Seen on CT.  Favored to be cysts but recommend outpatient renal MRI   Obesity class II - Outpatient follow-up    Discharge Instructions  Discharge Instructions     Diet - low sodium heart healthy   Complete by: As directed    Increase activity slowly   Complete by: As directed       Allergies as of 08/23/2024       Reactions   Ezetimibe  Other (See Comments)   Migraine aura's   Verapamil Other (See Comments)   Swelling in legs   Clonidine Derivatives Other (See Comments)   Ear Pain, Dizziness, Insomnia, migraine, constipation. Patient stated that medicine kept her off balance.   Kiwi Extract    Lactose Intolerance (gi) Other (See Comments)   Suspected. Intolerance to milk, yogurt and ice cream.    Other    Sugar, artificial sweeteners    Prednisone  Other (See Comments)   Mood swings   Valtrex [valacyclovir Hcl] Other (See Comments)   Maybe stomach pains   Vaseretic [enalapril-hydrochlorothiazide] Other (See Comments)   Gout    Cholestyramine Anxiety,  Other (See Comments), Swelling   Codeine Other (See Comments)   Pain in stomach        Medication List     STOP taking these medications    furosemide 20 MG tablet Commonly known as: LASIX   telmisartan  80 MG tablet Commonly known as: MICARDIS    TURMERIC CURCUMIN PO       TAKE  these medications    albuterol  108 (90 Base) MCG/ACT inhaler Commonly known as: VENTOLIN  HFA TAKE 2 PUFFS BY MOUTH EVERY 4 HOURS AS NEEDED What changed: See the new instructions.   allopurinol 100 MG tablet Commonly known as: ZYLOPRIM Take 200 mg by mouth daily as needed (gout).   amoxicillin-clavulanate 875-125 MG tablet Commonly known as: AUGMENTIN Take 1 tablet by mouth 2 (two) times daily for 10 days.   cetirizine 10 MG tablet Commonly known as: ZYRTEC Take 10 mg by mouth daily as needed for allergies.   cyanocobalamin  1000 MCG/ML injection Commonly known as: VITAMIN B12 Inject 1,000 mcg into the muscle every 30 (thirty) days.   dorzolamide-timolol 2-0.5 % ophthalmic solution Commonly known as: COSOPT Place 1 drop into both eyes 2 (two) times daily.   latanoprost 0.005 % ophthalmic solution Commonly known as: XALATAN Place 1 drop into both eyes at bedtime.   MULTIVITAMIN ADULTS PO Take 5 mLs by mouth daily. Bariatric multivitamin   Nebivolol  HCl 20 MG Tabs Take 20 mg by mouth at bedtime.   ondansetron 4 MG tablet Commonly known as: Zofran Take 1 tablet (4 mg total) by mouth every 8 (eight) hours as needed for nausea or vomiting.   pantoprazole  40 MG tablet Commonly known as: PROTONIX  Take 40 mg by mouth 2 (two) times daily.   potassium chloride  10 MEQ tablet Commonly known as: KLOR-CON  TAKE 1 TABLET BY MOUTH EVERY DAY   prednisoLONE acetate 1 % ophthalmic suspension Commonly known as: PRED FORTE Place 1 drop into the left eye in the morning and at bedtime.   rosuvastatin  5 MG tablet Commonly known as: CRESTOR  TAKE 1 TABLET BY MOUTH EVERY DAY IN THE EVENING   tamsulosin 0.4 MG Caps capsule Commonly known as: FLOMAX Take 1 capsule (0.4 mg total) by mouth daily. Start taking on: August 24, 2024   traMADol 50 MG tablet Commonly known as: Ultram Take 1 tablet (50 mg total) by mouth every 6 (six) hours as needed.   triamcinolone cream 0.5 % Commonly  known as: KENALOG Apply 1 Application topically as needed (irritation).        Follow-up Information     Claudene Pellet, MD. Schedule an appointment as soon as possible for a visit in 1 week(s).   Specialty: Family Medicine Contact information: [address] [place] [address] [phone]         Cam Morene ORN, MD. Schedule an appointment as soon as possible for a visit in 1 week(s).   Specialty: Urology Contact information: [address] [place] [address] [phone]                Allergies  Allergen Reactions   Ezetimibe  Other (See Comments)    Migraine aura's   Verapamil Other (See Comments)    Swelling in legs   Clonidine Derivatives Other (See Comments)    Ear Pain, Dizziness, Insomnia, migraine, constipation. Patient stated that medicine kept her off balance.   Kiwi Extract    Lactose Intolerance (Gi) Other (See Comments)    Suspected. Intolerance to milk, yogurt and ice  cream.    Other     Sugar, artificial sweeteners    Prednisone  Other (See Comments)    Mood swings   Valtrex [Valacyclovir Hcl] Other (See Comments)    Maybe stomach pains   Vaseretic [Enalapril-Hydrochlorothiazide] Other (See Comments)    Gout    Cholestyramine Anxiety, Other (See Comments) and Swelling   Codeine Other (See Comments)    Pain in stomach    Consultations: Urology   Procedures/Studies: CT Renal Stone Study Result Date: 08/21/2024 EXAM: CT UROGRAM 08/21/2024 08:05:04 PM TECHNIQUE: CT of the abdomen and pelvis was performed without the administration of intravenous contrast. Multiplanar reformatted images as well as MIP urogram images are provided for review. Automated exposure control, iterative reconstruction, and/or weight based adjustment of the mA/kV was utilized to reduce the radiation dose to as low as reasonably achievable. COMPARISON: None available. CLINICAL HISTORY: Abdominal/flank pain, stone suspected. FINDINGS: LOWER  CHEST: No acute abnormality. LIVER: Intrahepatic biliary dilation greatest in the left hepatic lobe. Additional dilation of the common bile duct measuring up to 1.2 cm in diameter. This may be due to reservoir effect post cholecystectomy however correlation with LFTs is recommended. If abnormal consider MRCP or ERCP for further evaluation. GALLBLADDER AND BILE DUCTS: Cholecystectomy. SPLEEN: No acute abnormality. PANCREAS: No acute abnormality. ADRENAL GLANDS: No acute abnormality. KIDNEYS, URETERS AND BLADDER: Asymmetric left perinephric stranding. Moderate left hydroureteronephrosis with a 4 mm stone at the left UVJ. 1.3 cm hyperdensity in the right upper kidney ( hounsfield units 61 ) and 1.2 cm hyperdensity in the posterior right kidney ( Hounsfield units 57 ) are favored to represent proteinaceous or hemorrhagic cysts ; however a solid mass is not excluded. Urinary bladder is unremarkable. GI AND BOWEL: Postoperative change about the stomach. Diverticulosis of the descending and sigmoid colon. There is focal wall thickening and stranding about the distal descending colon with adjacent stranding and fluid compatible with diverticulitis. There is no bowel obstruction. PERITONEUM AND RETROPERITONEUM: No abscess. No free air. VASCULATURE: Aorta is normal in caliber. LYMPH NODES: No lymphadenopathy. REPRODUCTIVE ORGANS: No acute abnormality. BONES AND SOFT TISSUES: No acute osseous abnormality. No focal soft tissue abnormality. IMPRESSION: 1. Moderate left hydroureteronephrosis with a 4 mm stone at the left UVJ. 2. Acute uncomplicated diverticulitis of the distal descending colon. Consider follow up colonoscopy after resolution of acute symptoms to exclude underlying mass. 3. Two right renal hyperdense lesions (1.3 cm and 1.2 cm, 61 and 57 HU), favored hemorrhagic/proteinaceous cysts but indeterminate; recommend renal protocol CT or MRI (preferred) for characterization. 4. Intrahepatic and common bile duct dilation  (CBD up to 1.2 cm) likely due to reservoir effect post-cholecystectomy; correlate with LFTs and, if abnormal, consider MRCP or ERCP. Electronically signed by: Norman Gatlin MD 08/21/2024 08:18 PM EST RP Workstation: [id number]      Subjective: Patient seen and examined at bedside.  Feels better and feels okay to go home today.  No fever, vomiting reported.  Still having intermittent lower abdominal pain.  Discharge Exam: Vitals:   08/23/24 0025 08/23/24 0521  BP: 128/60 (!) 159/79  Pulse: 72 60  Resp: 18 16  Temp: 98.5 F (36.9 C) 98.5 F (36.9 C)  SpO2: 100% 100%    General: Pt is alert, awake, not in acute distress Cardiovascular: rate controlled, S1/S2 + Respiratory: bilateral decreased breath sounds at bases Abdominal: Soft, obese, mildly tender in the lower quadrant,, ND, bowel sounds + Extremities: no edema, no cyanosis    The results of significant diagnostics from this  hospitalization (including imaging, microbiology, ancillary and laboratory) are listed below for reference.     Microbiology: No results found for this or any previous visit (from the past 240 hours).   Labs: BNP (last 3 results) No results for input(s): BNP in the last 8760 hours. Basic Metabolic Panel: Recent Labs  Lab 08/21/24 1520 08/22/24 0554 08/23/24 0543  NA 144 142 140  K 3.6 3.4* 3.2*  CL 109 109 106  CO2 23 22 24   GLUCOSE 138* 112* 113*  BUN 15 16 17   CREATININE 1.65* 1.75* 1.91*  CALCIUM  9.3 8.8* 8.7*   Liver Function Tests: Recent Labs  Lab 08/21/24 1520  AST 29  ALT 22  ALKPHOS 66  BILITOT 1.0  PROT 7.7  ALBUMIN 4.2   Recent Labs  Lab 08/21/24 1520  LIPASE 22   No results for input(s): AMMONIA in the last 168 hours. CBC: Recent Labs  Lab 08/21/24 1520 08/22/24 0554  WBC 6.9 7.7  NEUTROABS 5.4  --   HGB 12.4 10.6*  HCT 38.5 32.8*  MCV 96.3 96.8  PLT 253 234   Cardiac Enzymes: No results for input(s): CKTOTAL, CKMB, CKMBINDEX, TROPONINI  in the last 168 hours. BNP: Invalid input(s): POCBNP CBG: Recent Labs  Lab 08/22/24 0808 08/22/24 1126 08/22/24 1642 08/22/24 2145 08/23/24 0727  GLUCAP 97 102* 111* 98 93   D-Dimer No results for input(s): DDIMER in the last 72 hours. Hgb A1c Recent Labs    08/22/24 0554  HGBA1C 5.7*   Lipid Profile No results for input(s): CHOL, HDL, LDLCALC, TRIG, CHOLHDL, LDLDIRECT in the last 72 hours. Thyroid  function studies No results for input(s): TSH, T4TOTAL, T3FREE, THYROIDAB in the last 72 hours.  Invalid input(s): FREET3 Anemia work up No results for input(s): VITAMINB12, FOLATE, FERRITIN, TIBC, IRON, RETICCTPCT in the last 72 hours. Urinalysis    Component Value Date/Time   COLORURINE YELLOW 08/21/2024 1517   APPEARANCEUR CLEAR 08/21/2024 1517   LABSPEC 1.016 08/21/2024 1517   PHURINE 5.0 08/21/2024 1517   GLUCOSEU NEGATIVE 08/21/2024 1517   HGBUR SMALL (A) 08/21/2024 1517   BILIRUBINUR NEGATIVE 08/21/2024 1517   KETONESUR 5 (A) 08/21/2024 1517   PROTEINUR 30 (A) 08/21/2024 1517   UROBILINOGEN 0.2 02/08/2008 2009   NITRITE NEGATIVE 08/21/2024 1517   LEUKOCYTESUR NEGATIVE 08/21/2024 1517   Sepsis Labs Recent Labs  Lab 08/21/24 1520 08/22/24 0554  WBC 6.9 7.7   Microbiology No results found for this or any previous visit (from the past 240 hours).   Time coordinating discharge: 35 minutes  SIGNED:   Sophie Mao, MD  [facility] 08/23/2024, 10:11 AM

## 2024-08-23 NOTE — Progress Notes (Signed)
 Patient to discharge to home today. All discharge Instructions including all discharge Medications and schedules for these Mediations reviewed with the Patient. Patient verbalized understanding of all discharge instructions and discharge AVS with the Patient at time of discharge

## 2024-08-23 NOTE — TOC Transition Note (Signed)
 Transition of Care [facility]) - Discharge Note   Patient Details  Name: Wendy Downs MRN: [id number] Date of Birth: [birth year]  [facility] CM/SW Contact:  Bascom Service, RN Phone Number: 08/23/2024, 9:55 AM   Clinical Narrative: d/c home no CM needs.      Final next level of care: Home/Self Care Barriers to Discharge: No Barriers Identified   Patient Goals and CMS Choice Patient states their goals for this hospitalization and ongoing recovery are:: Home CMS Medicare.gov Compare Post Acute Care list provided to:: Patient Choice offered to / list presented to : Patient Luck ownership interest in [facility].provided to:: Patient    Discharge Placement                       Discharge Plan and Services Additional resources added to the After Visit Summary for     Discharge Planning Services: CM Consult Post Acute Care Choice: Resumption of Svcs/PTA Provider                               Social Drivers of Health (SDOH) Interventions SDOH Screenings   Food Insecurity: No Food Insecurity (08/22/2024)  Housing: Low Risk  (08/22/2024)  Transportation Needs: No Transportation Needs (08/22/2024)  Utilities: Not At Risk (08/22/2024)  Depression (PHQ2-9): Low Risk  (09/06/2021)  Social Connections: Moderately Integrated (08/22/2024)  Tobacco Use: Low Risk  (08/21/2024)     Readmission Risk Interventions     No data to display

## 2024-08-23 NOTE — Progress Notes (Signed)
 Mobility Specialist - Progress Note   08/23/24 0905  Mobility  Activity Ambulated with assistance  Level of Assistance Standby assist, set-up cues, supervision of patient - no hands on  Assistive Device None  Distance Ambulated (ft) 20 ft  Range of Motion/Exercises Active  Activity Response Tolerated well  Mobility visit 1 Mobility  Mobility Specialist Start Time (ACUTE ONLY) 0855  Mobility Specialist Stop Time (ACUTE ONLY) 0905  Mobility Specialist Time Calculation (min) (ACUTE ONLY) 10 min   Pt was found in bed getting up. Assisted to bathroom and at EOS returned to recliner chair. All needs met. Call bell in reach and chair alarm on.   Erminio Leos,  Mobility Specialist Can be reached via Secure Chat

## 2024-09-14 ENCOUNTER — Other Ambulatory Visit: Payer: Self-pay | Admitting: Cardiology

## 2024-09-26 ENCOUNTER — Other Ambulatory Visit: Payer: Self-pay | Admitting: Family Medicine

## 2024-09-26 DIAGNOSIS — Z1231 Encounter for screening mammogram for malignant neoplasm of breast: Secondary | ICD-10-CM

## 2024-11-17 ENCOUNTER — Other Ambulatory Visit: Payer: Self-pay | Admitting: Cardiology

## 2024-12-07 ENCOUNTER — Ambulatory Visit

## 2025-01-16 ENCOUNTER — Ambulatory Visit: Admitting: Cardiology
